# Patient Record
Sex: Male | Born: 1945 | Race: Black or African American | Hispanic: No | Marital: Married | Smoking: Former smoker
Health system: Southern US, Community
[De-identification: ages and names within clinical notes are randomized; demographics above are authoritative.]

## PROBLEM LIST (undated history)

## (undated) DIAGNOSIS — E7800 Pure hypercholesterolemia, unspecified: Secondary | ICD-10-CM

## (undated) DIAGNOSIS — D469 Myelodysplastic syndrome, unspecified: Secondary | ICD-10-CM

## (undated) DIAGNOSIS — I10 Essential (primary) hypertension: Secondary | ICD-10-CM

## (undated) DIAGNOSIS — D649 Anemia, unspecified: Secondary | ICD-10-CM

## (undated) DIAGNOSIS — E119 Type 2 diabetes mellitus without complications: Secondary | ICD-10-CM

## (undated) DIAGNOSIS — N186 End stage renal disease: Secondary | ICD-10-CM

## (undated) DIAGNOSIS — N400 Enlarged prostate without lower urinary tract symptoms: Secondary | ICD-10-CM

## (undated) DIAGNOSIS — N289 Disorder of kidney and ureter, unspecified: Secondary | ICD-10-CM

## (undated) DIAGNOSIS — M1990 Unspecified osteoarthritis, unspecified site: Secondary | ICD-10-CM

## (undated) DIAGNOSIS — Z992 Dependence on renal dialysis: Secondary | ICD-10-CM

## (undated) HISTORY — PX: COLONOSCOPY: SHX174

## (undated) HISTORY — DX: Pure hypercholesterolemia, unspecified: E78.00

## (undated) HISTORY — DX: End stage renal disease: N18.6

## (undated) HISTORY — DX: Type 2 diabetes mellitus without complications: E11.9

## (undated) HISTORY — DX: Benign prostatic hyperplasia without lower urinary tract symptoms: N40.0

## (undated) HISTORY — DX: Essential (primary) hypertension: I10

---
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
## 1988-10-17 HISTORY — PX: NECK SURGERY: SHX720

## 1998-09-01 ENCOUNTER — Ambulatory Visit (HOSPITAL_COMMUNITY): Admission: RE | Admit: 1998-09-01 | Discharge: 1998-09-01 | Payer: Self-pay | Admitting: Internal Medicine

## 1998-12-23 ENCOUNTER — Ambulatory Visit (HOSPITAL_COMMUNITY): Admission: RE | Admit: 1998-12-23 | Discharge: 1998-12-23 | Payer: Self-pay | Admitting: Internal Medicine

## 1999-03-18 HISTORY — PX: CERVICAL DISCECTOMY: SHX98

## 1999-05-05 ENCOUNTER — Ambulatory Visit (HOSPITAL_COMMUNITY): Admission: RE | Admit: 1999-05-05 | Discharge: 1999-05-05 | Payer: Self-pay | Admitting: Internal Medicine

## 1999-05-05 ENCOUNTER — Encounter: Payer: Self-pay | Admitting: Internal Medicine

## 1999-05-08 ENCOUNTER — Ambulatory Visit (HOSPITAL_COMMUNITY): Admission: RE | Admit: 1999-05-08 | Discharge: 1999-05-08 | Payer: Self-pay | Admitting: Internal Medicine

## 1999-05-08 ENCOUNTER — Encounter: Payer: Self-pay | Admitting: Internal Medicine

## 2000-01-24 ENCOUNTER — Encounter: Payer: Self-pay | Admitting: Emergency Medicine

## 2000-01-24 ENCOUNTER — Emergency Department (HOSPITAL_COMMUNITY): Admission: EM | Admit: 2000-01-24 | Discharge: 2000-01-24 | Payer: Self-pay | Admitting: Emergency Medicine

## 2000-03-04 ENCOUNTER — Encounter: Payer: Self-pay | Admitting: Neurological Surgery

## 2000-03-04 ENCOUNTER — Ambulatory Visit (HOSPITAL_COMMUNITY): Admission: RE | Admit: 2000-03-04 | Discharge: 2000-03-04 | Payer: Self-pay | Admitting: Neurological Surgery

## 2000-04-25 ENCOUNTER — Encounter: Payer: Self-pay | Admitting: Neurological Surgery

## 2000-04-27 ENCOUNTER — Inpatient Hospital Stay (HOSPITAL_COMMUNITY): Admission: RE | Admit: 2000-04-27 | Discharge: 2000-04-29 | Payer: Self-pay | Admitting: Neurological Surgery

## 2000-04-27 ENCOUNTER — Encounter: Payer: Self-pay | Admitting: Neurological Surgery

## 2000-06-14 ENCOUNTER — Encounter: Payer: Self-pay | Admitting: Neurological Surgery

## 2000-06-14 ENCOUNTER — Encounter: Admission: RE | Admit: 2000-06-14 | Discharge: 2000-06-14 | Payer: Self-pay | Admitting: Neurological Surgery

## 2004-06-01 ENCOUNTER — Encounter: Admission: RE | Admit: 2004-06-01 | Discharge: 2004-06-01 | Payer: Self-pay | Admitting: Internal Medicine

## 2005-11-17 ENCOUNTER — Encounter: Admission: RE | Admit: 2005-11-17 | Discharge: 2005-11-17 | Payer: Self-pay | Admitting: Internal Medicine

## 2007-05-24 ENCOUNTER — Encounter: Admission: RE | Admit: 2007-05-24 | Discharge: 2007-05-24 | Payer: Self-pay | Admitting: Internal Medicine

## 2008-09-29 ENCOUNTER — Encounter: Admission: RE | Admit: 2008-09-29 | Discharge: 2008-09-29 | Payer: Self-pay | Admitting: Internal Medicine

## 2008-10-06 ENCOUNTER — Encounter: Admission: RE | Admit: 2008-10-06 | Discharge: 2008-10-06 | Payer: Self-pay | Admitting: Nephrology

## 2008-10-17 HISTORY — PX: DIALYSIS FISTULA CREATION: SHX611

## 2008-11-24 ENCOUNTER — Ambulatory Visit: Payer: Self-pay | Admitting: Surgery

## 2008-11-25 ENCOUNTER — Ambulatory Visit (HOSPITAL_COMMUNITY): Admission: RE | Admit: 2008-11-25 | Discharge: 2008-11-25 | Payer: Self-pay | Admitting: Surgery

## 2009-02-12 ENCOUNTER — Encounter (HOSPITAL_COMMUNITY): Admission: RE | Admit: 2009-02-12 | Discharge: 2009-05-13 | Payer: Self-pay | Admitting: Nephrology

## 2009-05-20 ENCOUNTER — Encounter (HOSPITAL_COMMUNITY): Admission: RE | Admit: 2009-05-20 | Discharge: 2009-07-10 | Payer: Self-pay | Admitting: Nephrology

## 2009-05-29 ENCOUNTER — Ambulatory Visit: Payer: Self-pay | Admitting: Vascular Surgery

## 2009-05-29 ENCOUNTER — Ambulatory Visit (HOSPITAL_COMMUNITY): Admission: RE | Admit: 2009-05-29 | Discharge: 2009-05-29 | Payer: Self-pay | Admitting: Surgery

## 2009-07-07 ENCOUNTER — Ambulatory Visit: Payer: Self-pay | Admitting: Vascular Surgery

## 2009-08-06 ENCOUNTER — Ambulatory Visit: Payer: Self-pay | Admitting: Surgery

## 2009-08-06 ENCOUNTER — Encounter (HOSPITAL_COMMUNITY): Admission: RE | Admit: 2009-08-06 | Discharge: 2009-11-04 | Payer: Self-pay | Admitting: Vascular Surgery

## 2009-10-14 ENCOUNTER — Ambulatory Visit: Payer: Self-pay | Admitting: Vascular Surgery

## 2009-11-05 ENCOUNTER — Ambulatory Visit: Payer: Self-pay | Admitting: Vascular Surgery

## 2009-11-12 ENCOUNTER — Ambulatory Visit: Payer: Self-pay | Admitting: Vascular Surgery

## 2010-01-13 ENCOUNTER — Ambulatory Visit: Payer: Self-pay | Admitting: Vascular Surgery

## 2010-04-27 ENCOUNTER — Ambulatory Visit: Payer: Self-pay | Admitting: Vascular Surgery

## 2010-04-29 HISTORY — PX: CARDIAC CATHETERIZATION: SHX172

## 2010-06-22 ENCOUNTER — Ambulatory Visit: Payer: Self-pay | Admitting: Vascular Surgery

## 2010-10-17 HISTORY — PX: EYE SURGERY: SHX253

## 2010-10-28 ENCOUNTER — Encounter
Admission: RE | Admit: 2010-10-28 | Discharge: 2010-10-28 | Payer: Self-pay | Source: Home / Self Care | Attending: Gastroenterology | Admitting: Gastroenterology

## 2010-11-04 ENCOUNTER — Encounter
Admission: RE | Admit: 2010-11-04 | Discharge: 2010-11-04 | Payer: Self-pay | Source: Home / Self Care | Attending: Gastroenterology | Admitting: Gastroenterology

## 2010-11-07 ENCOUNTER — Encounter: Payer: Self-pay | Admitting: Nephrology

## 2011-01-22 LAB — POCT I-STAT 4, (NA,K, GLUC, HGB,HCT)
Glucose, Bld: 175 mg/dL — ABNORMAL HIGH (ref 70–99)
HCT: 40 % (ref 39.0–52.0)

## 2011-01-22 LAB — GLUCOSE, CAPILLARY: Glucose-Capillary: 144 mg/dL — ABNORMAL HIGH (ref 70–99)

## 2011-01-22 LAB — POCT HEMOGLOBIN-HEMACUE: Hemoglobin: 11.4 g/dL — ABNORMAL LOW (ref 13.0–17.0)

## 2011-01-23 LAB — RENAL FUNCTION PANEL
BUN: 94 mg/dL — ABNORMAL HIGH (ref 6–23)
CO2: 21 mEq/L (ref 19–32)
Chloride: 109 mEq/L (ref 96–112)
Creatinine, Ser: 13.72 mg/dL — ABNORMAL HIGH (ref 0.4–1.5)
Glucose, Bld: 103 mg/dL — ABNORMAL HIGH (ref 70–99)
Potassium: 3.9 mEq/L (ref 3.5–5.1)

## 2011-01-23 LAB — IRON AND TIBC
Iron: 45 ug/dL (ref 42–135)
TIBC: 256 ug/dL (ref 215–435)

## 2011-01-23 LAB — POCT HEMOGLOBIN-HEMACUE: Hemoglobin: 10.7 g/dL — ABNORMAL LOW (ref 13.0–17.0)

## 2011-01-24 LAB — IRON AND TIBC
Iron: 32 ug/dL — ABNORMAL LOW (ref 42–135)
Saturation Ratios: 13 % — ABNORMAL LOW (ref 20–55)
TIBC: 250 ug/dL (ref 215–435)
UIBC: 218 ug/dL

## 2011-01-24 LAB — FERRITIN: Ferritin: 282 ng/mL (ref 22–322)

## 2011-01-25 LAB — IRON AND TIBC
Iron: 21 ug/dL — ABNORMAL LOW (ref 42–135)
UIBC: 256 ug/dL

## 2011-01-25 LAB — FERRITIN: Ferritin: 44 ng/mL (ref 22–322)

## 2011-01-25 LAB — POCT HEMOGLOBIN-HEMACUE: Hemoglobin: 8.9 g/dL — ABNORMAL LOW (ref 13.0–17.0)

## 2011-01-27 ENCOUNTER — Encounter: Payer: Self-pay | Admitting: Internal Medicine

## 2011-02-01 LAB — GLUCOSE, CAPILLARY
Glucose-Capillary: 111 mg/dL — ABNORMAL HIGH (ref 70–99)
Glucose-Capillary: 131 mg/dL — ABNORMAL HIGH (ref 70–99)

## 2011-02-01 LAB — POCT I-STAT 4, (NA,K, GLUC, HGB,HCT): Glucose, Bld: 136 mg/dL — ABNORMAL HIGH (ref 70–99)

## 2011-03-01 NOTE — Op Note (Signed)
NAME:  [name], [name]             ACCOUNT NO.:  [id number]   MEDICAL RECORD NO.:  [id number]          PATIENT TYPE:  AMB   LOCATION:  SDS                          FACILITY:  [facility]   PHYSICIAN:  Judeth Cornfield. Scot Dock, M.D.DATE OF BIRTH:  [birth year]   DATE OF PROCEDURE:  05/29/2009  DATE OF DISCHARGE:  05/29/2009                               OPERATIVE REPORT   PREOPERATIVE DIAGNOSIS:  Chronic kidney disease.   POSTOPERATIVE DIAGNOSIS:  Chronic kidney disease.   PROCEDURE:  Placement of a 19-cm Palindrome catheter under ultrasound  guidance to the right internal jugular.   SURGEON:  Judeth Cornfield. Scot Dock, MD   ANESTHESIA:  Local with sedation.   TECHNIQUE:  The patient was taken to the operating room and sedated by  Anesthesia.  The ultrasound scanner was used to scan both internal  jugular veins, both of which appeared to be patent.  The neck and upper  chest were then prepped and draped in the usual sterile fashion.  With  the patient in Trendelenburg and under ultrasound guidance, the right IJ  was cannulated, and a guidewire introduced into the superior vena cava  under fluoroscopic control.  The tract over the wire was dilated, and  then the dilator and peel-away sheath were advanced over the wire, and  the wire and dilator removed.  The catheter was passed through the peel-  away sheath and positioned in the right atrium.  The exit site for the  catheter was selected, and the skin anesthetized between the 2 areas.  The catheter was then brought through the tunnel, cut to the appropriate  length, and distal ports were attached.  Both ports withdrew easily.  We  then flushed with heparinized saline and filled with concentrated  heparin.  The catheter was secured at its exit site with a 3-0 nylon  suture.  The IJ cannulation site was closed with 4-0 subcuticular  stitch.  Sterile dressing was applied.  The patient tolerated the  procedure well, and was transferred to the  recovery room in satisfactory  condition.  All needle and sponge counts were correct.      Judeth Cornfield. Scot Dock, M.D.  Electronically Signed     CSD/MEDQ  D:  05/29/2009  T:  05/30/2009  Job:  [id number]

## 2011-03-01 NOTE — Procedures (Signed)
CEPHALIC VEIN MAPPING   INDICATION:  Preop exam for AV fistula placement.   HISTORY:   EXAM:  The right cephalic vein is mostly compressible with mild chronic  thrombus noted at a valve leaflet of the proximal forearm level.   Diameter measurements range from 0.32 to 0.54 cm.   The left cephalic vein is compressible.   Diameter measurements range from 0.34 to 0.4 cm.   See attached worksheet for all measurements.   IMPRESSION:  Patent bilateral cephalic veins, as described above, with  diameter measurements noted above and on the attached worksheet.   ___________________________________________  V. Leia Alf, MD   CH/MEDQ  D:  11/24/2008  T:  11/24/2008  Job:  [id number]

## 2011-03-01 NOTE — Assessment & Plan Note (Signed)
OFFICE VISIT   [name], [name]  DOB:  1946-09-12                                       11/24/2008  [id number]   REASON FOR VISIT:  Evaluate for dialysis access.   HISTORY:  This is a 65 year old left-handed gentleman not yet on  dialysis who I am seeing at the request of Dr. Florene Glen for evaluation of  permanent access.  The patient has end-stage renal disease secondary to  hypertension and diabetes.  Again, he is not yet on dialysis.   PAST MEDICAL HISTORY:  Diabetes, hypertension.  He has been using  insulin.  He is on insulin and his age of onset of his diabetes was 86.   FAMILY HISTORY:  Positive for cardiovascular disease in his brother, his  father and his sister.   SOCIAL HISTORY:  He is married, he is retired.  Does not smoke.  He has  a history smoking but quit 20 years ago.  He does not drink alcohol.   REVIEW OF SYSTEMS:  GENERAL:  Negative for fever, chills, weight gain,  weight loss.  CARDIAC:  Negative for chest pain.  PULMONARY:  Negative for shortness of breath.  GI:  Positive for constipation.  GU:  Positive for frequent urination.  VASCULAR:  Negative.  NEURO:  Negative.  ORTHO:  Positive for arthritis and a rash.  PSYCH:  Negative.  ENT:  Negative.  HEME:  Negative.   MEDICATIONS:  Metoprolol, __________, glimepiride, diltiazem, Klor-Con,  Lasix, Diovan, Rena-Vite, doxazosin, allopurinol, Caltrate, Lantus and  PhosLo.   ALLERGIES:  Codeine.   PHYSICAL EXAMINATION:  His blood pressure is 168/87, pulse is 69,  temperature is 97.9.  General:  He is well-appearing, no distress.  Cardiovascular:  Regular rate and rhythm.  Pulmonary:  Lungs are clear  bilaterally.  Extremities:  Warm and well-perfused.  He has a right  brachial pulse and a right radial pulse.   DIAGNOSTIC STUDIES:  Vein mapping was performed today.  It shows  adequate right cephalic vein.  Just below the antecubital crease, there  is a partial occlusive  chronic thrombus on a valve leaflet.   ASSESSMENT/PLAN:  Chronic kidney disease needing dialysis.   PLAN:  The patient will be scheduled for a right upper arm fistula.  I  am going to try to do this tomorrow.  I do not feel doing a  radiocephalic fistula would be a good idea given this partially  occlusive chronic thrombus on the valve.  I discussed the risks and  benefits of the procedure, also the risk of steal and the risk of non-  maturity.  The patient understands these risks and is willing to  proceed.   Eldridge Abrahams, MD  Electronically Signed   VWB/MEDQ  D:  11/24/2008  T:  11/25/2008  Job:  [id number]   cc:   Darrold Span. Florene Glen, M.D.  Eric L. Marlou Sa, M.D.

## 2011-03-01 NOTE — Assessment & Plan Note (Signed)
OFFICE VISIT   [name], [name]  DOB:  [birth year]                                       07/07/2009  [id number]   The patient is a 65 year old male patient with end-stage renal disease  being dialyzed through a Diatek catheter in the right internal jugular  vein at the present time.  He had a right upper arm fistula created by  Dr. Oneida Alar in February of this year.  The fistula is patent with an  excellent pulse and palpable thrill but very tortuous.  One attempt at  using this for hemodialysis resulted in the needle puncturing the side  wall.  The patient has discussed this with Dr. Florene Glen regarding  utilizing different types of needles.  The patient is left-handed.   PHYSICAL EXAM:  Blood pressure 136/78, heart rate 70, respirations 14.  He has what appears to be a fairly good cephalic vein in his left  forearm except adjacent to the wrist which communicates with the upper  arm cephalic vein on the left.  The right upper arm fistula is widely  patent but obviously the vein is quite torturous although there are a  few short straight segments.   I hate to sacrifice the right upper arm fistula.  The only option we  would have surgically would be to replace this with an upper arm graft.  We can create a fistula in the left forearm if the renal doctors would  prefer for [place] to do this but I would think possibly they could use  shorter needles and come up with another plan to utilize the right upper  arm fistula since it is functioning so well.  I will await for input  from Dr. Florene Glen after he further discuss this with the patient regarding  the options.   Nelda Severe Kellie Simmering, M.D.  Electronically Signed   JDL/MEDQ  D:  07/07/2009  T:  07/08/2009  Job:  [id number]

## 2011-03-01 NOTE — Op Note (Signed)
NAME:  [name], [name]             ACCOUNT NO.:  [id number]   MEDICAL RECORD NO.:  [id number]          PATIENT TYPE:  AMB   LOCATION:  SDS                          FACILITY:  [facility]   PHYSICIAN:  Jessy Oto. Fields, MD  DATE OF BIRTH:  05/10/1946   DATE OF PROCEDURE:  11/25/2008  DATE OF DISCHARGE:  11/25/2008                               OPERATIVE REPORT   SURGEON:  Jessy Oto. Fields, MD   PROCEDURE:  Right brachiocephalic arteriovenous fistula.   PREOPERATIVE DIAGNOSIS:  End-stage renal disease.   POSTOPERATIVE DIAGNOSIS:  End-stage renal disease.   ANESTHESIA:  Local with IV sedation.   ASSISTANT:  Nurse.   OPERATIVE FINDINGS:  A 3.5- to 4-mm cephalic vein right arm.   OPERATIVE DETAILS:  After obtaining informed consent, the patient was  taken to the operating room.  The patient was placed in supine position  on the operating room table.  After adequate sedation, the patient's  entire right upper extremity was prepped and draped in the usual sterile  fashion.  Local anesthesia was infiltrated near the antecubital crease.  Transverse incision was made in this location, carried down through the  subcutaneous tissues down to the level of the cephalic vein.  Cephalic  vein was of good quality approximately 3.5-4 mm in diameter.  This was  dissected free circumferentially and small side branches ligated and  divided between silk ties.  Next, the brachial artery was dissected free  in the medial portion of the incision.  This was dissected free  circumferentially.  Small side branches were ligated and divided between  silk ties.  The artery was approximately 4-4.5 mm in diameter.  This was  dissected free circumferentially and vessel loops were placed proximal  and distally at the planned site of arteriotomy.  The patient was then  given 5000 units of intravenous heparin.  Vessel loops were used to  control brachial artery proximal and distally.  The distal cephalic vein  was  ligated with 2-0 silk tie and transected.  The vein was then gently  distended and flushed with heparinized saline.  This was then marked for  orientation.  Longitudinal opening was made in the brachial artery, and  the vein was sewn end of vein to side of artery using a running 7-0  Prolene suture.  Just prior to completion of the anastomosis, this was  forward bled and backbled and thoroughly flushed.  Anastomosis was  secured.  Vessel loops were released.  There was palpable thrill above  the fistula immediately.  Hemostasis was obtained.  Subcutaneous tissues  were reapproximated using interrupted 3-0  Vicryl sutures.  The skin was closed with 4-0 Vicryl subcuticular  stitch.  The patient had tolerated the procedure well, and there were no  complications.  Instrument, sponge, and needle counts were correct at  the end of the case.  The patient had palpable radial pulse at the end  of the case.      Jessy Oto. Fields, MD  Electronically Signed     CEF/MEDQ  D:  11/25/2008  T:  11/26/2008  Job:  [id number]

## 2011-03-04 NOTE — Op Note (Signed)
[place]. [facility]  Patient:    [name], [name]                    MRN: [id number] Proc. Date: 04/27/00 Adm. Date:  [id number] Disc. 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  PROCEDURE:  Anterior cervical diskectomy and arthrodesis of C6-7 and C7-T1, arthrodesis with structural allograft and Synthes fixation.  SURGEON:  Earleen Newport, M.D.  FIRST ASSISTANT:  Zigmund Daniel. Joya Salm, M.D.  ANESTHESIA:  General endotracheal.  INDICATIONS:  The patient is a 65 year old individual who has had significant left shoulder and right arm pain in the C8 distribution down to the right ulnar aspect of the hand.  The patient was found to have spondylitic disease throughout the cervical spine at the _________ level including a soft and hard disk at the C7-T1 level and spondylitic bridging and bilateral foraminal stenosis at C6-7.  The patient was advised regarding surgical decompression and stabilization of the C6-7 and C7-T1 levels.  DESCRIPTION OF PROCEDURE:  The patient was brought to the operating room and placed on the table in the supine position.  After smooth induction of general endotracheal anesthesia, he was placed in 5 pounds of halter traction.  The neck was shaved and prepped with DuraPrep and draped in a sterile fashion.  A transverse incision was made in the base of the neck and carried down through the platysma.  The plane between the sternocleidomastoid and the strap muscles were dissected bluntly until the prevertebral space was reached.  The first identifiable disk space most superiorly was noted to be C5-6.  Dissection was then carried inferiorly to expose the C6-7 and C7-T1.  The longus colli muscle was  stripped and a Caspar retractor was placed.  C6-7 was then opened first and the disk was noted to be markedly degenerated.  This space was evacuated of significant contents of degenerated disk material.  As the posterior longitudinal ligament was opened, there was noted to be a significant uncinate spur and spur from the inferior margin of the body of C7.  This was taken down with the Midas Rex and A2 bur.  Dissection was then carried out laterally to remove large uncinate spurs on both sides.  This allowed opening of the foramen of the C7 nerve root.  Once this area was well-decompressed, a 7 mm round fibular graft was placed into the interspace after being packed with some of the bone from the uncinate spur material.  Attention was then turned to C7-T1.  Here, a large right-sided uncinate spur with some soft material was encountered.  This was taken down and decompressed to alleviate the foramen of the C8 nerve root bilaterally.  Hemostasis from the soft tissues and epidural veins was obtained with a bipolar cautery and small pledgets of Gelfoam soaked in thrombin which were later removed.  Again, a 7 mm round fibular graft was packed with bone from the uncinate spur and placed into the interspace.  Traction was removed and the neck was placed into slight flexion.  A standard Synthes anterior cervical plate was then placed over this area after being contoured appropriately and placed into position with six  locking 4 x 14 mm screws.  Radiographic appearance of the construct was checked and found to be appropriate.  The area was copiously irrigated with antibiotic irrigating solution, and then the platysma was closed with 3-0 Vicryl in an interrupted fashion, and 3-0 Vicryl was used subcuticular. Tegaderm dressing was placed on the skin.  The patient tolerated the procedure well and was returned to the recovery room in stable condition. DD:  04/27/00 TD:  04/27/00 Job:  [id number]

## 2011-03-04 NOTE — H&P (Signed)
[place]. [facility]  Patient:    [name]                    MRN: [id number] Adm. Date:  [id number] Disc. Date: [id number] Attending:  Prentiss Bells                         History and Physical  ADMITTING DIAGNOSIS: Cervical spondylosis, C6-7 and C7-T1, with right cervical radiculopathy.  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  Last July he had a work-up including an MRI of the cervical spine performed on May 05, 1999 and that study demonstrated spondylitic disease, worse at the C6-7 level, with bilateral foraminal stenosis.  There was a question of some intrinsic signal change that was attributed to spinal fluid dynamics.  The patient has had persistent symptoms and gradually they have worsened such that he has noticed that his right hand and arm will wake up because of pain that feels like a toothache in the right arm.  He notes that trials with Vioxx initially seemed to help.  He was recently switched to Celebrex and this seemed to give him some relief, at least temporarily.  He will wake up in the middle of the night with significant bouts of pain.  Physical therapy helped minimally.  The lower extremities have been functioning well.  He reports no symptoms of numbness or pain in the lower extremities.  Bowel and bladder control have not been effected.  Coughing and sneezing do not seem to aggravate his pain.  PAST MEDICAL HISTORY: His general health has been fair.  1. Hypertension, controlled medically.  2. Diabetes, controlled medically.  MEDICATIONS:  1. Celebrex 200 mg q.d.  2. Prinivil 20 mg q.d. for blood pressure.  3. Allegra 60 mg t.i.d. for allergies.  4. Triamterene 37.5/25 mg for blood  pressure.  5. Glucophage 1000 mg b.i.d. for diabetes.  6. Tiazac 420 mg q.d. for blood pressure.  7. Cardura 4 mg b.i.d. for blood pressure.  ALLERGIES: He notes that CODEINE tends to make him vomit.  PAST SURGICAL HISTORY: The patient notes that he has had no surgical intervention in the past.  SOCIAL HISTORY: He does not smoke.  He drinks alcohol socially.  His weight has been stable.  FAMILY HISTORY: His mother died at age 88 of breast cancer.  His father died at age 49 of high blood pressure.  REVIEW OF SYSTEMS: Notable for night sweats, wearing of glasses, high blood pressure, kidney stones, back pain, pain in the neck and arm.  A Review Of Systems sheet was reviewed with the patient present.  PHYSICAL EXAMINATION:  GENERAL: He is an alert, oriented, and cooperative individual in no overt distress.  VITAL SIGNS: Weight 210 pounds.  Height 6 feet.  Blood pressure 160/94, heart rate 66 and regular, respirations 14.  NEUROLOGIC: Range of motion of his neck reveals that he turns only 45 degrees to the right and 60 degrees to the left.  He extends and flexes fully.  Axial compression does not reproduce pain.  Motor strength in the upper extremities reveals the deltoid and biceps have 5/5 strength, triceps strength is 5/5, intrinsic strength reveals some weakness in the extensor  digiti minimi on the right side only.  There is a slight amount of atrophy in the hypothenar eminence on the right.  Sensory is intact to pinprick in the C5, C6, and C7 distributions, decreased clearly in the C8 distribution - particularly on the right side.  Cranial nerve examination reveals the pupils are 4 mm and briskly reactive to light and accommodation.  Extraocular movements were full.  Face is symmetric to grimace.  Tongues and uvula are midline.  Sclerae and conjunctivae are clear.  NECK: No bruits.  No masses palpable, though there is tenderness to palpation in the right supraclavicular  fossa.  No bruits heard.  LUNGS: Clear to auscultation.  HEART: Regular rate and rhythm.  ABDOMEN: Soft, bowel sounds positive.  No masses are palpable.  EXTREMITIES: No clubbing, cyanosis, or edema.  IMPRESSION: The patient has evidence of a right-sided cervical spondylitic disease with significant foraminal stenosis at C6-7 bilaterally and C7-T1 particularly on the right.  PLAN: He is now being admitted to undergo surgical decompression and stabilization at the lower two levels. DD:  04/27/00 TD:  04/27/00 Job: [id number]

## 2011-09-29 LAB — HEMOGLOBIN A1C: Hgb A1c MFr Bld: 6.9 % — AB (ref 4.0–6.0)

## 2011-09-29 LAB — BASIC METABOLIC PANEL WITH GFR
BUN: 33 mg/dL — AB (ref 4–21)
Creatinine: 10 mg/dL — AB (ref 0.6–1.3)
Glucose: 75 mg/dL
Potassium: 4.8 mmol/L (ref 3.4–5.3)
Sodium: 142 mmol/L (ref 137–147)

## 2011-09-29 LAB — HEPATIC FUNCTION PANEL
AST: 11 U/L — AB (ref 14–40)
Bilirubin, Total: 0.5 mg/dL

## 2011-10-27 ENCOUNTER — Ambulatory Visit: Payer: Self-pay | Admitting: Vascular Surgery

## 2011-10-27 LAB — POTASSIUM: Potassium: 4.2 mmol/L (ref 3.5–5.1)

## 2011-11-22 ENCOUNTER — Ambulatory Visit: Payer: Self-pay | Admitting: Vascular Surgery

## 2011-11-22 LAB — BASIC METABOLIC PANEL
Calcium, Total: 9.6 mg/dL (ref 8.5–10.1)
Chloride: 93 mmol/L — ABNORMAL LOW (ref 98–107)
Co2: 34 mmol/L — ABNORMAL HIGH (ref 21–32)
Glucose: 165 mg/dL — ABNORMAL HIGH (ref 65–99)
Osmolality: 287 (ref 275–301)
Potassium: 4.3 mmol/L (ref 3.5–5.1)
Sodium: 139 mmol/L (ref 136–145)

## 2012-06-19 HISTORY — PX: TRANSTHORACIC ECHOCARDIOGRAM: SHX275

## 2012-07-10 ENCOUNTER — Other Ambulatory Visit: Payer: Self-pay | Admitting: *Deleted

## 2012-07-13 ENCOUNTER — Encounter (HOSPITAL_COMMUNITY): Payer: Self-pay

## 2012-07-18 MED ORDER — SODIUM CHLORIDE 0.9 % IJ SOLN: 3.0000 mL | INTRAMUSCULAR | Status: DC | PRN

## 2012-07-19 ENCOUNTER — Ambulatory Visit (HOSPITAL_COMMUNITY): Admission: RE | Admit: 2012-07-19 | Payer: Medicare Other | Source: Ambulatory Visit | Admitting: Vascular Surgery

## 2012-07-19 ENCOUNTER — Encounter (HOSPITAL_COMMUNITY): Admission: RE | Payer: Self-pay | Source: Ambulatory Visit

## 2012-07-19 SURGERY — SHUNTOGRAM
Anesthesia: LOCAL

## 2012-08-17 ENCOUNTER — Emergency Department (HOSPITAL_COMMUNITY)
Admission: EM | Admit: 2012-08-17 | Discharge: 2012-08-17 | Disposition: A | Discharge status: Home / Self Care | Payer: Medicare Other | Attending: Emergency Medicine | Admitting: Emergency Medicine

## 2012-08-17 ENCOUNTER — Encounter (HOSPITAL_COMMUNITY): Payer: Self-pay | Admitting: Family Medicine

## 2012-08-17 DIAGNOSIS — R350 Frequency of micturition: Secondary | ICD-10-CM | POA: Insufficient documentation

## 2012-08-17 DIAGNOSIS — Z79899 Other long term (current) drug therapy: Secondary | ICD-10-CM | POA: Insufficient documentation

## 2012-08-17 DIAGNOSIS — N400 Enlarged prostate without lower urinary tract symptoms: Secondary | ICD-10-CM | POA: Insufficient documentation

## 2012-08-17 DIAGNOSIS — N186 End stage renal disease: Secondary | ICD-10-CM | POA: Insufficient documentation

## 2012-08-17 DIAGNOSIS — E119 Type 2 diabetes mellitus without complications: Secondary | ICD-10-CM | POA: Insufficient documentation

## 2012-08-17 DIAGNOSIS — E7800 Pure hypercholesterolemia, unspecified: Secondary | ICD-10-CM | POA: Insufficient documentation

## 2012-08-17 DIAGNOSIS — N390 Urinary tract infection, site not specified: Secondary | ICD-10-CM | POA: Insufficient documentation

## 2012-08-17 DIAGNOSIS — I959 Hypotension, unspecified: Secondary | ICD-10-CM | POA: Insufficient documentation

## 2012-08-17 DIAGNOSIS — I10 Essential (primary) hypertension: Secondary | ICD-10-CM | POA: Insufficient documentation

## 2012-08-17 LAB — URINALYSIS, ROUTINE W REFLEX MICROSCOPIC
Ketones, ur: 15 mg/dL — AB
Protein, ur: >300 mg/dL — AB
Urobilinogen, UA: 0.2 mg/dL (ref 0.0–1.0)

## 2012-08-17 LAB — URINE MICROSCOPIC-ADD ON

## 2012-08-17 MED ORDER — TRAMADOL HCL 50 MG PO TABS: 50.0000 mg | ORAL_TABLET | Freq: Two times a day (BID) | ORAL | Status: DC

## 2012-08-17 MED ORDER — CEPHALEXIN 500 MG PO CAPS: 500.0000 mg | ORAL_CAPSULE | Freq: Two times a day (BID) | ORAL | Status: DC

## 2012-08-17 MED ORDER — CEPHALEXIN 500 MG PO CAPS: 1000.0000 mg | ORAL_CAPSULE | Freq: Two times a day (BID) | ORAL | Status: DC

## 2012-08-17 NOTE — ED Notes (Signed)
Ordered diabetic food tray.

## 2012-08-17 NOTE — ED Provider Notes (Signed)
Medical screening examination/treatment/procedure(s) were conducted as a shared visit with non-physician practitioner(s) and myself.  I personally evaluated the patient during the encounter  Orlie Dakin, MD 08/17/12 1641

## 2012-08-17 NOTE — ED Notes (Signed)
Per pt sts was at dialysis center this am and BP was too low. sts hematuria since last night. sts also lower abdominal pain

## 2012-08-17 NOTE — ED Notes (Signed)
Pt sts he can void yet most productive in AM at wake up.  Pt sts remainder of day he only voids in "drops".

## 2012-08-17 NOTE — ED Provider Notes (Signed)
History     CSN: [id number]  Arrival date & time 08/17/12  [id number]   First MD Initiated Contact with Patient 08/17/12 0757      Chief Complaint  Patient presents with  . Hypotension  . Urinary Tract Infection    (Consider location/radiation/quality/duration/timing/severity/associated sxs/prior treatment) HPI Patient reports pain in his penis and urinary frequency onset yesterday. Denies abdominal pain denies vomiting or nausea denies other complaint denies discharge from penis Seen at dialysis center this morning. States his blood pressure was 123456 systolic. He was told for dialysis has been arranged for him for tomorrow sent here for further evaluation. Denies abdominal pain pain is worse with urination he is presently asymptomatic Past Medical History  Diagnosis Date  . Type II or unspecified type diabetes mellitus without mention of complication, not stated as uncontrolled   . End stage renal disease   . Essential hypertension, malignant   . Hypertrophy of prostate without urinary obstruction and other lower urinary tract symptoms (LUTS)   . Pure hypercholesterolemia     Past Surgical History  Procedure Date  . Back surgery     History reviewed. No pertinent family history.  History  Substance Use Topics  . Smoking status: Never Smoker   . Smokeless tobacco: Not on file  . Alcohol Use: No      Review of Systems  Genitourinary: Positive for dysuria.  All other systems reviewed and are negative.    Allergies  Codeine and Doxycycline  Home Medications   Current Outpatient Rx  Name Route Sig Dispense Refill  . ALLOPURINOL 300 MG PO TABS Oral Take 300 mg by mouth daily.      [name] AMLODIPINE BESYLATE 5 MG PO TABS Oral Take 5 mg by mouth daily.    [name] CINACALCET HCL 90 MG PO TABS Oral Take 90 mg by mouth daily.    [name] DOXAZOSIN MESYLATE 8 MG PO TABS Oral Take 8 mg by mouth daily.      [name] GLIMEPIRIDE 2 MG PO TABS Oral Take 2 mg by mouth daily before breakfast.    .  LANTHANUM CARBONATE 1000 MG PO CHEW Oral Chew 1,000 mg by mouth 4 (four) times daily. With meals and snacks    . LATANOPROST 0.005 % OP SOLN Both Eyes Place 1 drop into both eyes at bedtime.      [name] LINAGLIPTIN 5 MG PO TABS Oral Take 5 mg by mouth daily.    [name] METOPROLOL TARTRATE 50 MG PO TABS Oral Take 0.5 mg by mouth 2 (two) times daily.    [name] SEVELAMER CARBONATE 800 MG PO TABS Oral Take 1,600 mg by mouth 3 (three) times daily before meals. 3 tabs-3 times daily      BP 124/58  Pulse 100  Temp 99.3 F (37.4 C)  Resp 18  SpO2 98%  Physical Exam  Nursing note and vitals reviewed. Constitutional: He appears well-developed and well-nourished.  HENT:  Head: Normocephalic and atraumatic.  Eyes: Conjunctivae normal are normal. Pupils are equal, round, and reactive to light.  Neck: Neck supple. No tracheal deviation present. No thyromegaly present.  Cardiovascular: Normal rate and regular rhythm.   No murmur heard. Pulmonary/Chest: Effort normal and breath sounds normal.  Abdominal: Soft. Bowel sounds are normal. He exhibits no distension. There is no tenderness.  Genitourinary:       Normal circumcised male no lesion no discharge  Musculoskeletal: Normal range of motion. He exhibits no edema and no tenderness.  Right upper extremity with dialysis graft with good thrill  Neurological: He is alert. Coordination normal.  Skin: Skin is warm and dry. No rash noted.  Psychiatric: He has a normal mood and affect.    ED Course  Procedures (including critical care time)  Labs Reviewed - No data to display No results found.   No diagnosis found.    MDM  Patient reports he is call dialysis center and dialysis is arranged for him tomorrow A Foley catheter was placed and the patient temporarily to get a sample as he produces very little urine Diagnosis dysuria        Orlie Dakin, MD 08/17/12 1121

## 2012-08-17 NOTE — ED Provider Notes (Signed)
[name] is a 66 y.o. male in CDU pending urinalysis sign out from Dr. Winfred Leeds as follows: Plan is to consult urology for close followup to evaluate pain 2 penile meatus and dysuria. Patient has past medical history significant for diabetes, ESRD on dialysis (makes scant amount of urine), high cholesterol and BPH complaining of urinary frequency and dysuria for the last 24 hours. Patient has missed dialysis this morning. He has arranged for dialysis tomorrow.  Results for orders placed during the hospital encounter of 08/17/12  URINALYSIS, ROUTINE W REFLEX MICROSCOPIC      Component Value Range   Color, Urine AMBER (*) YELLOW   APPearance CLOUDY (*) CLEAR   Specific Gravity, Urine 1.020  1.005 - 1.030   pH 7.0  5.0 - 8.0   Glucose, UA NEGATIVE  NEGATIVE mg/dL   Hgb urine dipstick LARGE (*) NEGATIVE   Bilirubin Urine SMALL (*) NEGATIVE   Ketones, ur 15 (*) NEGATIVE mg/dL   Protein, ur >300 (*) NEGATIVE mg/dL   Urobilinogen, UA 0.2  0.0 - 1.0 mg/dL   Nitrite POSITIVE (*) NEGATIVE   Leukocytes, UA MODERATE (*) NEGATIVE  URINE MICROSCOPIC-ADD ON      Component Value Range   WBC, UA TOO NUMEROUS TO COUNT  <3 WBC/hpf   RBC / HPF TOO NUMEROUS TO COUNT  <3 RBC/hpf   Bacteria, UA MANY (*) RARE   Urine-Other LESS THAN 10 mL OF URINE SUBMITTED     Urine culture is pending.   Consult from urologist Dr. Jasmine December appreciated: He will see the patient in his office on November 11 at 945. This is the earliest available appointment. I have encouraged the patient to have a very low threshold to return to the emergency room for any worsening of symptoms before that time.   Pt verbalized understanding and agrees with care plan. Outpatient follow-up and return precautions given.    New Prescriptions   CEPHALEXIN (KEFLEX) 500 MG CAPSULE    Take 1 capsule (500 mg total) by mouth 2 (two) times daily. Take after dialysis   TRAMADOL (ULTRAM) 50 MG TABLET    Take 1 tablet (50 mg total) by mouth 2  (two) times daily.     Monico Blitz, PA-C 08/17/12 1333  [place], PA-C 08/17/12 1349

## 2012-08-17 NOTE — ED Notes (Signed)
Patient has a fistula in his right upper arm, +bruit/thrill.

## 2012-08-17 NOTE — ED Notes (Signed)
Patient has been having lower back pain yesterday that has travelled to the front abdomen. He is having 8/10 pain at this time. He is a renal dialysis patient, M,W,F. He was at the dialysis center when his BP was low and refused to have dialysis.

## 2012-08-20 LAB — URINE CULTURE: Colony Count: >=100000

## 2012-08-21 NOTE — ED Notes (Signed)
+   urine Patient treated with keflex-sensitive to same-chart appended per protocol MD.

## 2012-10-01 ENCOUNTER — Inpatient Hospital Stay (HOSPITAL_COMMUNITY)
Admission: EM | Admit: 2012-10-01 | Discharge: 2012-10-04 | DRG: 689 | Disposition: A | Discharge status: Home / Self Care | Payer: Medicare Other | Attending: Internal Medicine | Admitting: Internal Medicine

## 2012-10-01 ENCOUNTER — Emergency Department (HOSPITAL_COMMUNITY): Payer: Medicare Other

## 2012-10-01 ENCOUNTER — Encounter (HOSPITAL_COMMUNITY): Payer: Self-pay | Admitting: Emergency Medicine

## 2012-10-01 DIAGNOSIS — E113513 Type 2 diabetes mellitus with proliferative diabetic retinopathy with macular edema, bilateral: Secondary | ICD-10-CM | POA: Diagnosis present

## 2012-10-01 DIAGNOSIS — N390 Urinary tract infection, site not specified: Principal | ICD-10-CM

## 2012-10-01 DIAGNOSIS — N200 Calculus of kidney: Secondary | ICD-10-CM

## 2012-10-01 DIAGNOSIS — N186 End stage renal disease: Secondary | ICD-10-CM

## 2012-10-01 DIAGNOSIS — E119 Type 2 diabetes mellitus without complications: Secondary | ICD-10-CM

## 2012-10-01 DIAGNOSIS — D631 Anemia in chronic kidney disease: Secondary | ICD-10-CM | POA: Diagnosis present

## 2012-10-01 DIAGNOSIS — D649 Anemia, unspecified: Secondary | ICD-10-CM

## 2012-10-01 DIAGNOSIS — Z992 Dependence on renal dialysis: Secondary | ICD-10-CM

## 2012-10-01 DIAGNOSIS — I10 Essential (primary) hypertension: Secondary | ICD-10-CM

## 2012-10-01 DIAGNOSIS — N138 Other obstructive and reflux uropathy: Secondary | ICD-10-CM | POA: Diagnosis present

## 2012-10-01 DIAGNOSIS — N1330 Unspecified hydronephrosis: Secondary | ICD-10-CM

## 2012-10-01 DIAGNOSIS — N139 Obstructive and reflux uropathy, unspecified: Secondary | ICD-10-CM | POA: Diagnosis present

## 2012-10-01 DIAGNOSIS — N039 Chronic nephritic syndrome with unspecified morphologic changes: Secondary | ICD-10-CM | POA: Diagnosis present

## 2012-10-01 DIAGNOSIS — N401 Enlarged prostate with lower urinary tract symptoms: Secondary | ICD-10-CM | POA: Diagnosis present

## 2012-10-01 DIAGNOSIS — I120 Hypertensive chronic kidney disease with stage 5 chronic kidney disease or end stage renal disease: Secondary | ICD-10-CM | POA: Diagnosis present

## 2012-10-01 HISTORY — DX: Disorder of kidney and ureter, unspecified: N28.9

## 2012-10-01 HISTORY — DX: Unspecified osteoarthritis, unspecified site: M19.90

## 2012-10-01 LAB — COMPREHENSIVE METABOLIC PANEL
ALT: 8 U/L (ref 0–53)
AST: 14 U/L (ref 0–37)
Calcium: 9.4 mg/dL (ref 8.4–10.5)
Creatinine, Ser: 7 mg/dL — ABNORMAL HIGH (ref 0.50–1.35)
GFR calc Af Amer: 9 mL/min — ABNORMAL LOW (ref >90)
Glucose, Bld: 221 mg/dL — ABNORMAL HIGH (ref 70–99)
Sodium: 138 mEq/L (ref 135–145)
Total Protein: 8.2 g/dL (ref 6.0–8.3)

## 2012-10-01 LAB — CBC WITH DIFFERENTIAL/PLATELET
Basophils Absolute: 0 10*3/uL (ref 0.0–0.1)
Eosinophils Absolute: 0 10*3/uL (ref 0.0–0.7)
Eosinophils Relative: 0 % (ref 0–5)
MCH: 29.8 pg (ref 26.0–34.0)
MCV: 92.7 fL (ref 78.0–100.0)
Platelets: 213 10*3/uL (ref 150–400)
RDW: 15.1 % (ref 11.5–15.5)

## 2012-10-01 LAB — TROPONIN I: Troponin I: <0.3 ng/mL (ref <0.30)

## 2012-10-01 MED ORDER — MORPHINE SULFATE 4 MG/ML IJ SOLN
4.0000 mg | Freq: Once | INTRAMUSCULAR | Status: AC
  Administered 2012-10-01: 4 mg via INTRAVENOUS
  Filled 2012-10-01: qty 1

## 2012-10-01 MED ORDER — ONDANSETRON HCL 4 MG/2ML IJ SOLN
4.0000 mg | Freq: Once | INTRAMUSCULAR | Status: AC
  Administered 2012-10-01: 4 mg via INTRAVENOUS
  Filled 2012-10-01: qty 2

## 2012-10-01 MED ORDER — DEXTROSE 5 % IV SOLN
1.0000 g | Freq: Once | INTRAVENOUS | Status: AC
  Administered 2012-10-01: 1 g via INTRAVENOUS
  Filled 2012-10-01: qty 10

## 2012-10-01 MED ORDER — IOHEXOL 300 MG/ML  SOLN
100.0000 mL | Freq: Once | INTRAMUSCULAR | Status: AC | PRN
  Administered 2012-10-01: 100 mL via INTRAVENOUS

## 2012-10-01 MED ORDER — ACETAMINOPHEN 325 MG PO TABS
650.0000 mg | ORAL_TABLET | Freq: Once | ORAL | Status: AC
  Administered 2012-10-01: 650 mg via ORAL
  Filled 2012-10-01: qty 2

## 2012-10-01 MED ORDER — MORPHINE SULFATE 2 MG/ML IJ SOLN: 2.0000 mg | Freq: Once | INTRAMUSCULAR | Status: DC

## 2012-10-01 MED ORDER — IOHEXOL 300 MG/ML  SOLN
20.0000 mL | INTRAMUSCULAR | Status: AC
  Administered 2012-10-01 (×2): 20 mL via ORAL

## 2012-10-01 NOTE — ED Provider Notes (Signed)
66 y/o male moved to CDU to await CT scan which shows interval multiple tiny calculy in distal right ureter with interval mild right hydronephrosis and hydroureter. Pain at this time rated 7/10. He is still nauseated. Fever of 100.2. Tylenol, zofran and morphine given. On exam patient is laying in bed and appears slightly uncomfortable. HEENT negative. Heart RRR. Lungs CTAB. Abdomen soft with normal bowel sounds, TTP in right flank with guarding. No peritoneal signs. AAOx3. Giving IV rocephin since he is now febrile and cannot obtain u/a. 8:51 PM Dr. Christy Gentles consulted Dr. Roni Bread with urology who suggested patient be admitted. Dr. Jasmine December who is familiar with patient will see him in the morning. IV gentamycin has worked for his previous UTIs which can be done by the hospitalist. Family aware of plan. Admitted to [facility] team [address] [facility].  Illene Labrador, PA-C 10/01/12 2101

## 2012-10-01 NOTE — ED Notes (Signed)
Verbal report given to Renee,RN on 6700

## 2012-10-01 NOTE — ED Provider Notes (Signed)
History     CSN: [id number]  Arrival date & time 10/01/12  1242   First MD Initiated Contact with Patient 10/01/12 1502      Chief Complaint  Patient presents with  . Emesis     Patient is a 66 y.o. male presenting with vomiting. The history is provided by the patient and the spouse.  Emesis  This is a new problem. Episode onset: just prior to arrival. Episode frequency: several times. The problem has been gradually worsening. The emesis has an appearance of stomach contents. There has been no fever. Associated symptoms include abdominal pain. Pertinent negatives include no chills, no cough, no diarrhea, no fever and no headaches.  worsened by - nothing Improved by - nothing  Pt presents for vomiting and abdominal/back pain.  He reports he was at dialysis and started to have right sided back pain and vomiting.  He reports the pain is worsening.  No cp/sob.  No diarrhea.  No focal weakness.  No cough.  No recent falls.  He has been feeling well the past several days.  He does report recent treatment for uti but finished the course already.  He reported he had up to 3 hrs of dialysis He reports he only makes urine once/day. He relates this pain to similar to previous kidney stones He has been on dialysis for 3 yrs  Past Medical History  Diagnosis Date  . Type II or unspecified type diabetes mellitus without mention of complication, not stated as uncontrolled   . End stage renal disease   . Essential hypertension, malignant   . Hypertrophy of prostate without urinary obstruction and other lower urinary tract symptoms (LUTS)   . Pure hypercholesterolemia     Past Surgical History  Procedure Date  . Back surgery     History reviewed. No pertinent family history.  History  Substance Use Topics  . Smoking status: Never Smoker   . Smokeless tobacco: Not on file  . Alcohol Use: No      Review of Systems  Constitutional: Negative for fever and chills.  Respiratory: Negative  for cough.   Cardiovascular: Negative for chest pain.  Gastrointestinal: Positive for vomiting and abdominal pain. Negative for diarrhea.  Musculoskeletal: Positive for back pain.  Skin: Negative for color change.  Neurological: Negative for headaches.  Psychiatric/Behavioral: Negative for agitation.  All other systems reviewed and are negative.    Allergies  Codeine and Doxycycline  Home Medications   Current Outpatient Rx  Name  Route  Sig  Dispense  Refill  . ALLOPURINOL 300 MG PO TABS   Oral   Take 300 mg by mouth daily.           [name] AMLODIPINE BESYLATE 5 MG PO TABS   Oral   Take 5 mg by mouth daily.         [name] CINACALCET HCL 90 MG PO TABS   Oral   Take 90 mg by mouth daily.         [name] DOXAZOSIN MESYLATE 8 MG PO TABS   Oral   Take 8 mg by mouth daily.           [name] GLIMEPIRIDE 4 MG PO TABS   Oral   Take 4 mg by mouth daily before breakfast.         . LANTHANUM CARBONATE 1000 MG PO CHEW   Oral   Chew 1,000 mg by mouth 4 (four) times daily. With meals and snacks         .  LATANOPROST 0.005 % OP SOLN   Both Eyes   Place 1 drop into both eyes at bedtime.           [name] LINAGLIPTIN 5 MG PO TABS   Oral   Take 5 mg by mouth daily.         [name] METOPROLOL SUCCINATE ER 50 MG PO TB24   Oral   Take 25 mg by mouth 2 (two) times daily. Take with or immediately following a meal.         . SEVELAMER CARBONATE 800 MG PO TABS   Oral   Take 2,400 mg by mouth 3 (three) times daily before meals.            BP 168/74  Pulse 66  Temp 98.1 F (36.7 C)  Resp 20  SpO2 100%  Physical Exam CONSTITUTIONAL: Well developed/well nourished, uncomfortable appearing HEAD AND FACE: Normocephalic/atraumatic EYES: EOMI/PERRL ENMT: Mucous membranes moist NECK: supple no meningeal signs SPINE:entire spine nontender CV: S1/S2 noted, no murmurs/rubs/gallops noted LUNGS: Lungs are clear to auscultation bilaterally, no apparent distress ABDOMEN: soft, right upper  quadrant and right flank tenderness, tenderness is moderate, no rebound or guarding GU:no cva tenderness NEURO: Pt is awake/alert, moves all extremitiesx4 EXTREMITIES: pulses normal, full ROM. HD access noted to right UE with thrill SKIN: warm, color normal PSYCH: no abnormalities of mood noted  ED Course  Procedures   Labs Reviewed  CBC WITH DIFFERENTIAL - Abnormal; Notable for the following:    WBC 11.7 (*)     RBC 3.83 (*)     Hemoglobin 11.4 (*)     HCT 35.5 (*)     Neutrophils Relative 85 (*)     Neutro Abs 9.9 (*)     Lymphocytes Relative 8 (*)     All other components within normal limits  COMPREHENSIVE METABOLIC PANEL - Abnormal; Notable for the following:    Chloride 94 (*)     Glucose, Bld 221 (*)     Creatinine, Ser 7.00 (*)     GFR calc non Af Amer 7 (*)     GFR calc Af Amer 9 (*)     All other components within normal limits  LIPASE, BLOOD   3:25 PM Pt with acute onset of vomiting/right abdominal/flank pain.  Will need CT imaging of abdomen.  He only makes urine/day so unable to capture at this time.  Will also obtain EKG  5:42 PM Pt still with pain.  CT imaging pending Will place in CDU pending CT imaging Stable at this time   MDM  Nursing notes including past medical history and social history reviewed and considered in documentation Labs/vital reviewed and considered        Date: 10/01/2012  Rate: 76  Rhythm: normal sinus rhythm  QRS Axis: normal  Intervals: normal  ST/T Wave abnormalities: nonspecific ST changes  Conduction Disutrbances:none  Narrative Interpretation:   Old EKG Reviewed: none available     Sharyon Cable, MD 10/01/12 1742

## 2012-10-01 NOTE — ED Notes (Signed)
CT aware pt finished drinking contrast 

## 2012-10-01 NOTE — ED Notes (Signed)
Was at dialysis this am x 3 hours when he started to vomit he finished  And thern came here rt abd pain also only voids x 1 a day

## 2012-10-01 NOTE — ED Notes (Signed)
Pt has vomited a large amount. Pa is aware

## 2012-10-01 NOTE — ED Notes (Signed)
Pt reports pain over right flank area.

## 2012-10-01 NOTE — ED Notes (Signed)
Was tx recently for uti

## 2012-10-01 NOTE — H&P (Signed)
[name] is an 66 y.o. male.   Patient was seen and examined on October 01, 2012. PCP - Dr. Kevan Ny. Chief Complaint: Right flank pain. HPI: 66 year old male with history of ESRD on hemodialysis, diabetes mellitus type 2 and hypertension started experiencing nausea and vomiting with right flank pain during dialysis today. Had multiple episodes of nausea vomiting and was brought to the ER. He has had completed his dialysis today. CT abdomen pelvis done showed right-sided hydronephrosis with ureteral stone. On-call urologist Dr. Jeffie Pollock was consulted and at this time they will be seeing patient in consult (patient is known to urologist Dr. Jasmine December) and patient will be admitted for further management. Patient has had recent urinary tract infections and recent cultures done last one showed Escherichia coli sensitive to ceftriaxone and also Zosyn.  Past Medical History  Diagnosis Date  . Type II or unspecified type diabetes mellitus without mention of complication, not stated as uncontrolled   . End stage renal disease   . Essential hypertension, malignant   . Hypertrophy of prostate without urinary obstruction and other lower urinary tract symptoms (LUTS)   . Pure hypercholesterolemia   . Renal insufficiency     Past Surgical History  Procedure Date  . Back surgery     Family History  Problem Relation Age of Onset  . Hypertension Brother   . Diabetes Mellitus II Brother    Social History:  reports that he has never smoked. He does not have any smokeless tobacco history on file. He reports that he does not drink alcohol. His drug history not on file.  Allergies:  Allergies  Allergen Reactions  . Codeine Nausea And Vomiting  . Doxycycline Nausea And Vomiting     (Not in a hospital admission)  Results for orders placed during the hospital encounter of 10/01/12 (from the past 48 hour(s))  CBC WITH DIFFERENTIAL     Status: Abnormal   Collection Time   10/01/12  2:05 PM       Component Value Range Comment   WBC 11.7 (*) 4.0 - 10.5 K/uL    RBC 3.83 (*) 4.22 - 5.81 MIL/uL    Hemoglobin 11.4 (*) 13.0 - 17.0 g/dL    HCT 35.5 (*) 39.0 - 52.0 %    MCV 92.7  78.0 - 100.0 fL    MCH 29.8  26.0 - 34.0 pg    MCHC 32.1  30.0 - 36.0 g/dL    RDW 15.1  11.5 - 15.5 %    Platelets 213  150 - 400 K/uL    Neutrophils Relative 85 (*) 43 - 77 %    Neutro Abs 9.9 (*) 1.7 - 7.7 K/uL    Lymphocytes Relative 8 (*) 12 - 46 %    Lymphs Abs 0.9  0.7 - 4.0 K/uL    Monocytes Relative 8  3 - 12 %    Monocytes Absolute 0.9  0.1 - 1.0 K/uL    Eosinophils Relative 0  0 - 5 %    Eosinophils Absolute 0.0  0.0 - 0.7 K/uL    Basophils Relative 0  0 - 1 %    Basophils Absolute 0.0  0.0 - 0.1 K/uL   COMPREHENSIVE METABOLIC PANEL     Status: Abnormal   Collection Time   10/01/12  2:05 PM      Component Value Range Comment   Sodium 138  135 - 145 mEq/L    Potassium 4.0  3.5 - 5.1 mEq/L  Chloride 94 (*) 96 - 112 mEq/L    CO2 28  19 - 32 mEq/L    Glucose, Bld 221 (*) 70 - 99 mg/dL    BUN 18  6 - 23 mg/dL    Creatinine, Ser 7.00 (*) 0.50 - 1.35 mg/dL    Calcium 9.4  8.4 - 10.5 mg/dL    Total Protein 8.2  6.0 - 8.3 g/dL    Albumin 3.9  3.5 - 5.2 g/dL    AST 14  0 - 37 U/L    ALT 8  0 - 53 U/L    Alkaline Phosphatase 91  39 - 117 U/L    Total Bilirubin 0.6  0.3 - 1.2 mg/dL    GFR calc non Af Amer 7 (*) >90 mL/min    GFR calc Af Amer 9 (*) >90 mL/min   LIPASE, BLOOD     Status: Normal   Collection Time   10/01/12  2:05 PM      Component Value Range Comment   Lipase 34  11 - 59 U/L   TROPONIN I     Status: Normal   Collection Time   10/01/12  2:05 PM      Component Value Range Comment   Troponin I <0.30  <0.30 ng/mL    Ct Abdomen Pelvis W Contrast  10/01/2012  *RADIOLOGY REPORT*  Clinical Data: Abdomen and back pain.  Vomiting.  CT ABDOMEN AND PELVIS WITH CONTRAST  Technique:  Multidetector CT imaging of the abdomen and pelvis was performed following the standard protocol during  bolus administration of intravenous contrast.  Contrast: 144mL OMNIPAQUE IOHEXOL 300 MG/ML  SOLN  Comparison: 08/27/2012.  Findings: Small sliding hiatal hernia.  Multiple colonic diverticula without evidence of diverticulitis.  Bilateral renal calcifications and parapelvic and cortical cysts are unchanged. Essentially empty urinary bladder.  Interval multiple tiny calculi in the distal right ureter with interval mild dilatation of the right ureter and renal collecting system. Interval right perinephric soft tissue stranding.  Bilateral inguinal hernias containing fat are again demonstrated. No enlarged lymph nodes.  Mildly enlarged prostate gland.  Stable left anterior subcutaneous mass.  Unremarkable liver, spleen, pancreas, gallbladder and adrenal glands.  Minimal dependent atelectasis at the lung bases.  Lumbar and lower thoracic spine degenerative changes.  IMPRESSION:  1.  Interval multiple tiny calculi in the distal right ureter with interval mild right hydronephrosis and hydroureter. 2.  Otherwise, stable bilateral renal calculi and cysts. 3.  Stable bilateral inguinal hernias containing fat. 4.  Stable left anterior abdominal subcutaneous mass, possibly representing a sebaceous cyst. 5.  Small sliding hiatal hernia. 6.  Colonic diverticulosis.   Original Report Authenticated By: Claudie Revering, M.D.     Review of Systems  Constitutional: Negative.   HENT: Negative.   Eyes: Negative.   Respiratory: Negative.   Cardiovascular: Negative.   Gastrointestinal: Positive for nausea, vomiting and abdominal pain.  Genitourinary: Positive for flank pain.  Musculoskeletal: Negative.   Skin: Negative.   Neurological: Negative.   Endo/Heme/Allergies: Negative.   Psychiatric/Behavioral: Negative.     Blood pressure 177/83, pulse 82, temperature 98.1 F (36.7 C), resp. rate 20, SpO2 93.00%. Physical Exam  Constitutional: He is oriented to person, place, and time. He appears well-developed and  well-nourished. No distress.  HENT:  Head: Normocephalic and atraumatic.  Right Ear: External ear normal.  Left Ear: External ear normal.  Nose: Nose normal.  Mouth/Throat: Oropharynx is clear and moist. No oropharyngeal exudate.  Eyes: Conjunctivae normal are normal.  Pupils are equal, round, and reactive to light. Right eye exhibits no discharge. Left eye exhibits no discharge. No scleral icterus.  Neck: Normal range of motion. Neck supple.  Cardiovascular: Normal rate and regular rhythm.   Respiratory: Effort normal and breath sounds normal. No respiratory distress. He has no wheezes. He has no rales.  GI: Soft. Bowel sounds are normal. He exhibits no distension. There is no tenderness. There is no rebound.  Musculoskeletal: He exhibits no edema and no tenderness.  Neurological: He is alert and oriented to person, place, and time.       Moves all extremities.  Skin: Skin is warm and dry. He is not diaphoretic.     Assessment/Plan #1. Complicated urine tract infection with right-sided obstructing stone of the kidney - patient has been started on empiric antibiotics Zosyn. Follow urine cultures. At this time patient does not look like his sepsis. Closely observe in telemetry. For urology recommendations. Patient will be kept n.p.o. after midnight in anticipation of possible procedure. #2. ESRD on hemodialysis - Monday Wednesday Friday - consult nephrology for dialysis. #3. Hypertension - since patient is n.p.o. we'll keep patient on when necessary IV hydralazine for systolic blood pressure more than 160. #4. Diabetes mellitus type 2 - CBG every 4 sliding scale until patient starts taking orally. #5. Anemia probably from chronic kidney disease - follow CBC.  CODE STATUS - full code.  Rise Patience. 10/01/2012, 9:59 PM

## 2012-10-02 ENCOUNTER — Encounter (HOSPITAL_COMMUNITY): Payer: Self-pay | Admitting: General Practice

## 2012-10-02 ENCOUNTER — Other Ambulatory Visit: Payer: Self-pay | Admitting: Urology

## 2012-10-02 DIAGNOSIS — D649 Anemia, unspecified: Secondary | ICD-10-CM

## 2012-10-02 DIAGNOSIS — E119 Type 2 diabetes mellitus without complications: Secondary | ICD-10-CM

## 2012-10-02 LAB — CBC
HCT: 35 % — ABNORMAL LOW (ref 39.0–52.0)
MCH: 29.5 pg (ref 26.0–34.0)
MCHC: 32 g/dL (ref 30.0–36.0)
MCV: 92.1 fL (ref 78.0–100.0)
Platelets: 222 10*3/uL (ref 150–400)
RDW: 15.7 % — ABNORMAL HIGH (ref 11.5–15.5)
WBC: 20.5 10*3/uL — ABNORMAL HIGH (ref 4.0–10.5)

## 2012-10-02 LAB — BASIC METABOLIC PANEL
BUN: 29 mg/dL — ABNORMAL HIGH (ref 6–23)
CO2: 30 mEq/L (ref 19–32)
Calcium: 9.3 mg/dL (ref 8.4–10.5)
Creatinine, Ser: 9.46 mg/dL — ABNORMAL HIGH (ref 0.50–1.35)
Glucose, Bld: 135 mg/dL — ABNORMAL HIGH (ref 70–99)

## 2012-10-02 LAB — GLUCOSE, CAPILLARY: Glucose-Capillary: 86 mg/dL (ref 70–99)

## 2012-10-02 MED ORDER — ONDANSETRON HCL 4 MG PO TABS: 4.0000 mg | ORAL_TABLET | Freq: Four times a day (QID) | ORAL | Status: DC | PRN

## 2012-10-02 MED ORDER — LANTHANUM CARBONATE 500 MG PO CHEW
1000.0000 mg | CHEWABLE_TABLET | Freq: Every day | ORAL | Status: DC | PRN
  Filled 2012-10-02: qty 2

## 2012-10-02 MED ORDER — DOXAZOSIN MESYLATE 8 MG PO TABS
8.0000 mg | ORAL_TABLET | Freq: Every day | ORAL | Status: DC
  Administered 2012-10-02 – 2012-10-04 (×3): 8 mg via ORAL
  Filled 2012-10-02 (×3): qty 1

## 2012-10-02 MED ORDER — LINAGLIPTIN 5 MG PO TABS
5.0000 mg | ORAL_TABLET | Freq: Every day | ORAL | Status: DC
  Administered 2012-10-02 – 2012-10-04 (×2): 5 mg via ORAL
  Filled 2012-10-02 (×4): qty 1

## 2012-10-02 MED ORDER — CINACALCET HCL 30 MG PO TABS: 90.0000 mg | ORAL_TABLET | Freq: Every day | ORAL | Status: DC

## 2012-10-02 MED ORDER — SODIUM CHLORIDE 0.9 % IJ SOLN
3.0000 mL | Freq: Two times a day (BID) | INTRAMUSCULAR | Status: DC
  Administered 2012-10-02 – 2012-10-04 (×4): 3 mL via INTRAVENOUS

## 2012-10-02 MED ORDER — PIPERACILLIN-TAZOBACTAM IN DEX 2-0.25 GM/50ML IV SOLN
2.2500 g | Freq: Three times a day (TID) | INTRAVENOUS | Status: DC
  Administered 2012-10-02 – 2012-10-04 (×7): 2.25 g via INTRAVENOUS
  Filled 2012-10-02 (×10): qty 50

## 2012-10-02 MED ORDER — ACETAMINOPHEN 650 MG RE SUPP: 650.0000 mg | Freq: Four times a day (QID) | RECTAL | Status: DC | PRN

## 2012-10-02 MED ORDER — LANTHANUM CARBONATE 500 MG PO CHEW
1000.0000 mg | CHEWABLE_TABLET | Freq: Four times a day (QID) | ORAL | Status: DC
Start: 2012-10-02 — End: 2012-10-02

## 2012-10-02 MED ORDER — AMLODIPINE BESYLATE 5 MG PO TABS
5.0000 mg | ORAL_TABLET | Freq: Every day | ORAL | Status: DC
  Administered 2012-10-02 – 2012-10-03 (×2): 5 mg via ORAL
  Filled 2012-10-02 (×3): qty 1

## 2012-10-02 MED ORDER — SEVELAMER CARBONATE 800 MG PO TABS
2400.0000 mg | ORAL_TABLET | Freq: Three times a day (TID) | ORAL | Status: DC
  Administered 2012-10-03 – 2012-10-04 (×2): 2400 mg via ORAL
  Filled 2012-10-02 (×10): qty 3

## 2012-10-02 MED ORDER — INSULIN ASPART 100 UNIT/ML ~~LOC~~ SOLN
0.0000 [IU] | SUBCUTANEOUS | Status: DC
  Administered 2012-10-02 – 2012-10-03 (×2): 2 [IU] via SUBCUTANEOUS

## 2012-10-02 MED ORDER — PARICALCITOL 5 MCG/ML IV SOLN
6.0000 ug | INTRAVENOUS | Status: DC
  Administered 2012-10-03: 6 ug via INTRAVENOUS
  Filled 2012-10-02: qty 1.2

## 2012-10-02 MED ORDER — INSULIN ASPART 100 UNIT/ML ~~LOC~~ SOLN: 0.0000 [IU] | Freq: Three times a day (TID) | SUBCUTANEOUS | Status: DC

## 2012-10-02 MED ORDER — MORPHINE SULFATE 2 MG/ML IJ SOLN
1.0000 mg | INTRAMUSCULAR | Status: DC | PRN
  Administered 2012-10-02 (×3): 1 mg via INTRAVENOUS
  Filled 2012-10-02 (×3): qty 1

## 2012-10-02 MED ORDER — ONDANSETRON HCL 4 MG/2ML IJ SOLN: 4.0000 mg | Freq: Four times a day (QID) | INTRAMUSCULAR | Status: DC | PRN

## 2012-10-02 MED ORDER — HYDRALAZINE HCL 20 MG/ML IJ SOLN
10.0000 mg | INTRAMUSCULAR | Status: DC | PRN
  Filled 2012-10-02: qty 0.5

## 2012-10-02 MED ORDER — ALLOPURINOL 300 MG PO TABS
300.0000 mg | ORAL_TABLET | Freq: Every day | ORAL | Status: DC
  Administered 2012-10-02 – 2012-10-04 (×3): 300 mg via ORAL
  Filled 2012-10-02 (×3): qty 1

## 2012-10-02 MED ORDER — METOPROLOL SUCCINATE ER 25 MG PO TB24
25.0000 mg | ORAL_TABLET | Freq: Two times a day (BID) | ORAL | Status: DC
  Administered 2012-10-02 – 2012-10-04 (×6): 25 mg via ORAL
  Filled 2012-10-02 (×7): qty 1

## 2012-10-02 MED ORDER — CINACALCET HCL 30 MG PO TABS
90.0000 mg | ORAL_TABLET | Freq: Every day | ORAL | Status: DC
  Administered 2012-10-03 – 2012-10-04 (×2): 90 mg via ORAL
  Filled 2012-10-02 (×5): qty 3

## 2012-10-02 MED ORDER — GLIMEPIRIDE 4 MG PO TABS
4.0000 mg | ORAL_TABLET | Freq: Every day | ORAL | Status: DC
  Administered 2012-10-03 – 2012-10-04 (×2): 4 mg via ORAL
  Filled 2012-10-02 (×4): qty 1

## 2012-10-02 MED ORDER — LANTHANUM CARBONATE 500 MG PO CHEW
1000.0000 mg | CHEWABLE_TABLET | Freq: Three times a day (TID) | ORAL | Status: DC
  Administered 2012-10-03 – 2012-10-04 (×2): 1000 mg via ORAL
  Filled 2012-10-02 (×10): qty 2

## 2012-10-02 MED ORDER — LATANOPROST 0.005 % OP SOLN
1.0000 [drp] | Freq: Every day | OPHTHALMIC | Status: DC
  Administered 2012-10-02 – 2012-10-03 (×2): 1 [drp] via OPHTHALMIC
  Filled 2012-10-02: qty 2.5

## 2012-10-02 MED ORDER — SODIUM CHLORIDE 0.9 % IJ SOLN
3.0000 mL | Freq: Two times a day (BID) | INTRAMUSCULAR | Status: DC
  Administered 2012-10-02 (×3): 3 mL via INTRAVENOUS

## 2012-10-02 MED ORDER — ACETAMINOPHEN 325 MG PO TABS: 650.0000 mg | ORAL_TABLET | Freq: Four times a day (QID) | ORAL | Status: DC | PRN

## 2012-10-02 NOTE — Progress Notes (Signed)
[facility]                                                                                Patient Demographics  [name], is a 66 y.o. male  [id number]  [id number]  DOB - [birth year]  Admit date - 10/01/2012  Admitting Physician Rise Patience, MD  Outpatient Primary MD for the patient is Marlou Sa, ERIC, MD  LOS - 1   Chief Complaint  Patient presents with  . Emesis        Assessment & Plan    #1. Complicated urine tract infection with right-sided obstructing stone of the kidney - patient has been started on empiric antibiotics Zosyn. Follow urine cultures. At this time patient does not look like his sepsis. Closely observe in telemetry. I have again discussed the case with urologist on call Dr.Delhsadt who suggested Dr. Jeffie Pollock will see the patient shortly. Patient will be kept n.p.o. after midnight in anticipation of possible procedure.     #2. ESRD on hemodialysis - Monday Wednesday Friday - have consulted nephrology for dialysis.     #3. Hypertension - home meds with sips,  we'll keep patient on when necessary IV hydralazine for systolic blood pressure more than 160.     #4. Diabetes mellitus type 2 - CBG every 4 sliding scale until patient starts taking orally. Amaryl held today will be given first dose tomorrow with breakfast.  CBG (last 3)   Basename 10/02/12 0413 10/02/12 0121  GLUCAP 160* 198*      #5. Mild Anemia probably from chronic kidney disease - follow CBC.     Code Status: Full  Family Communication: Discussed with the patient and wife bedside  Disposition Plan: Home   Procedures    Consults  urology, nephrology   DVT Prophylaxis   SCDs    Lab Results  Component Value Date   PLT 222 10/02/2012    Medications  Scheduled Meds:   . allopurinol  300 mg Oral Daily  . amLODipine  5 mg Oral Daily  . cinacalcet  90 mg Oral Q breakfast  . doxazosin  8 mg Oral Daily  . glimepiride  4 mg Oral QAC  breakfast  . insulin aspart  0-9 Units Subcutaneous Q4H  . lanthanum  1,000 mg Oral TID WC  . latanoprost  1 drop Both Eyes QHS  . linagliptin  5 mg Oral Daily  . metoprolol succinate  25 mg Oral BID  . piperacillin-tazobactam (ZOSYN)  IV  2.25 g Intravenous Q8H  . sevelamer  2,400 mg Oral TID AC  . sodium chloride  3 mL Intravenous Q12H  . sodium chloride  3 mL Intravenous Q12H   Continuous Infusions:  PRN Meds:.acetaminophen, acetaminophen, hydrALAZINE, lanthanum, morphine injection, ondansetron (ZOFRAN) IV, ondansetron  Antibiotics     Anti-infectives     Start     Dose/Rate Route Frequency Ordered Stop   10/02/12 0100  piperacillin-tazobactam (ZOSYN) IVPB 2.25 g       2.25 g 100 mL/hr over 30 Minutes Intravenous 3 times per day 10/02/12 0034     10/01/12 1915   cefTRIAXone (ROCEPHIN) 1 g in dextrose 5 % 50 mL IVPB  1 g 100 mL/hr over 30 Minutes Intravenous  Once 10/01/12 1912 10/01/12 2300           Time Spent in minutes   45   Tamura Lasky K M.D on 10/02/2012 at 7:40 AM  Between 7am to 7pm - Pager - [phone]  After 7pm go to [email or web address] - password [email or web address]  And look for the night coverage person covering for me after hours  [facility] Office  [phone]    Subjective:   Makai Corris today has, No headache, No chest pain, No abdominal pain - No Nausea, No new weakness tingling or numbness, No Cough - SOB. Mild right flank pain.  Objective:   Filed Vitals:   10/01/12 2200 10/01/12 2301 10/01/12 2324 10/02/12 0417  BP: 169/74  155/69 158/75  Pulse: 78  83 81  Temp:  99.8 F (37.7 C) 99.2 F (37.3 C) 99.7 F (37.6 C)  TempSrc:  Oral Oral Oral  Resp: 21  20 18   Height:   6' (1.829 m)   Weight:   90.9 kg (200 lb 6.4 oz)   SpO2: 99%  100% 100%    Wt Readings from Last 3 Encounters:  10/01/12 90.9 kg (200 lb 6.4 oz)  12/14/10 93.441 kg (206 lb)     Intake/Output Summary (Last 24 hours) at 10/02/12 0740 Last data filed  at 10/02/12 0418  Gross per 24 hour  Intake      0 ml  Output      0 ml  Net      0 ml    Exam Awake Alert, Oriented X 3, No new F.N deficits, Normal affect [place].AT,PERRAL Supple Neck,No JVD, No cervical lymphadenopathy appriciated.  Symmetrical Chest wall movement, Good air movement bilaterally, CTAB RRR,No Gallops,Rubs or new Murmurs, No Parasternal Heave +ve B.Sounds, Abd Soft, Non tender, No organomegaly appriciated, No rebound - guarding or rigidity. No Cyanosis, Clubbing or edema, No new Rash or bruise      Data Review   Micro Results No results found for this or any previous visit (from the past 240 hour(s)).  Radiology Reports Ct Abdomen Pelvis W Contrast  10/01/2012  *RADIOLOGY REPORT*  Clinical Data: Abdomen and back pain.  Vomiting.  CT ABDOMEN AND PELVIS WITH CONTRAST  Technique:  Multidetector CT imaging of the abdomen and pelvis was performed following the standard protocol during bolus administration of intravenous contrast.  Contrast: 186mL OMNIPAQUE IOHEXOL 300 MG/ML  SOLN  Comparison: 08/27/2012.  Findings: Small sliding hiatal hernia.  Multiple colonic diverticula without evidence of diverticulitis.  Bilateral renal calcifications and parapelvic and cortical cysts are unchanged. Essentially empty urinary bladder.  Interval multiple tiny calculi in the distal right ureter with interval mild dilatation of the right ureter and renal collecting system. Interval right perinephric soft tissue stranding.  Bilateral inguinal hernias containing fat are again demonstrated. No enlarged lymph nodes.  Mildly enlarged prostate gland.  Stable left anterior subcutaneous mass.  Unremarkable liver, spleen, pancreas, gallbladder and adrenal glands.  Minimal dependent atelectasis at the lung bases.  Lumbar and lower thoracic spine degenerative changes.  IMPRESSION:  1.  Interval multiple tiny calculi in the distal right ureter with interval mild right hydronephrosis and hydroureter. 2.   Otherwise, stable bilateral renal calculi and cysts. 3.  Stable bilateral inguinal hernias containing fat. 4.  Stable left anterior abdominal subcutaneous mass, possibly representing a sebaceous cyst. 5.  Small sliding hiatal hernia. 6.  Colonic diverticulosis.   Original Report Authenticated By: Claudie Revering, M.D.  CBC  Lab 10/02/12 0540 10/01/12 1405  WBC 20.5* 11.7*  HGB 11.2* 11.4*  HCT 35.0* 35.5*  PLT 222 213  MCV 92.1 92.7  MCH 29.5 29.8  MCHC 32.0 32.1  RDW 15.7* 15.1  LYMPHSABS -- 0.9  MONOABS -- 0.9  EOSABS -- 0.0  BASOSABS -- 0.0  BANDABS -- --    Chemistries   Lab 10/02/12 0540 10/01/12 1405  NA 141 138  K 4.4 4.0  CL 97 94*  CO2 30 28  GLUCOSE 135* 221*  BUN 29* 18  CREATININE 9.46* 7.00*  CALCIUM 9.3 9.4  MG -- --  AST -- 14  ALT -- 8  ALKPHOS -- 91  BILITOT -- 0.6   ------------------------------------------------------------------------------------------------------------------ estimated creatinine clearance is 8.7 ml/min (by C-G formula based on Cr of 9.46). ------------------------------------------------------------------------------------------------------------------ No results found for this basename: HGBA1C:2 in the last 72 hours ------------------------------------------------------------------------------------------------------------------ No results found for this basename: CHOL:2,HDL:2,LDLCALC:2,TRIG:2,CHOLHDL:2,LDLDIRECT:2 in the last 72 hours ------------------------------------------------------------------------------------------------------------------ No results found for this basename: TSH,T4TOTAL,FREET3,T3FREE,THYROIDAB in the last 72 hours ------------------------------------------------------------------------------------------------------------------ No results found for this basename: VITAMINB12:2,FOLATE:2,FERRITIN:2,TIBC:2,IRON:2,RETICCTPCT:2 in the last 72 hours  Coagulation profile No results found for this basename:  INR:5,PROTIME:5 in the last 168 hours  No results found for this basename: DDIMER:2 in the last 72 hours  Cardiac Enzymes  Lab 10/01/12 1405  CKMB --  TROPONINI <0.30  MYOGLOBIN --   ------------------------------------------------------------------------------------------------------------------ No components found with this basename: POCBNP:3

## 2012-10-02 NOTE — Progress Notes (Signed)
After strained pt's urine,very small pieces were noticed on the strainer,they look like tiny stones.keep monitoring closely.

## 2012-10-02 NOTE — Consult Note (Signed)
Urology Consult  Requesting provider:  Dr. Hal Hope.  CC: Right ureter stone. UTI.  HPI: 66 year old male admitted for abdominal pain. He has ERSD and is on dialysis. CT scan 10/01/12 revealed a right distal ureter stone. This was just a few millimeters in size. It is located very close to the right ureterovesical junction. There is also possibly a more proximal stone just a few millimeters away from this. It is associated with right-sided hydronephrosis. The patient's UA was concerning for urinary tract infection, and he is recently had a urinary tract infection. He states he began having symptoms yesterday. He started having right-sided flank pain. This was sharp in nature. It was associated with nausea. It is improved narcotic. It is not associated with fever.  Today we discussed management options. I explained that observation is a possibility, but I'm concerned given his possible urinary tract infection and the fact that he does not produced much urine since he is on dialysis. I feel that it would be difficult for him to pass the stone, but not impossible. Shock wave lithotripsy would not be a good option given the fact that he likely has a urinary tract infection. The other option would be cystoscopy with ureteroscopy and laser lithotripsy. We discussed the risks, benefits, alternatives, and likelihood of achieving goals.  PMH: Past Medical History  Diagnosis Date  . Type II or unspecified type diabetes mellitus without mention of complication, not stated as uncontrolled   . End stage renal disease   . Essential hypertension, malignant   . Hypertrophy of prostate without urinary obstruction and other lower urinary tract symptoms (LUTS)   . Pure hypercholesterolemia   . Renal insufficiency     PSH: Past Surgical History  Procedure Date  . Back surgery     Allergies: Allergies  Allergen Reactions  . Codeine Nausea And Vomiting  . Doxycycline Nausea And Vomiting     Medications: Prescriptions prior to admission  Medication Sig Dispense Refill  . allopurinol (ZYLOPRIM) 300 MG tablet Take 300 mg by mouth daily.        [name] amLODipine (NORVASC) 5 MG tablet Take 5 mg by mouth daily.      . cinacalcet (SENSIPAR) 90 MG tablet Take 90 mg by mouth daily.      [name] doxazosin (CARDURA) 8 MG tablet Take 8 mg by mouth daily.        [name] glimepiride (AMARYL) 4 MG tablet Take 4 mg by mouth daily before breakfast.      . lanthanum (FOSRENOL) 1000 MG chewable tablet Chew 1,000 mg by mouth 4 (four) times daily. With meals and snacks      . latanoprost (XALATAN) 0.005 % ophthalmic solution Place 1 drop into both eyes at bedtime.        [name] linagliptin (TRADJENTA) 5 MG TABS tablet Take 5 mg by mouth daily.      . metoprolol succinate (TOPROL-XL) 50 MG 24 hr tablet Take 25 mg by mouth 2 (two) times daily. Take with or immediately following a meal.      . sevelamer (RENVELA) 800 MG tablet Take 2,400 mg by mouth 3 (three) times daily before meals.          Social History: History   Social History  . Marital Status: Married    Spouse Name: N/A    Number of Children: N/A  . Years of Education: N/A   Occupational History  . Not on file.   Social History Main Topics  .  Smoking status: Never Smoker   . Smokeless tobacco: Not on file  . Alcohol Use: No  . Drug Use:   . Sexually Active:    Other Topics Concern  . Not on file   Social History Narrative  . No narrative on file    Family History: Family History  Problem Relation Age of Onset  . Hypertension Brother   . Diabetes Mellitus II Brother     Review of Systems: Positive: Right flank pain, nausea. Negative: Fever, chest pain, or gross hematuria.  A further 10 point review of systems was negative except what is listed in the HPI.  Physical Exam: Filed Vitals:   10/02/12 0417  BP: 158/75  Pulse: 81  Temp: 99.7 F (37.6 C)  Resp: 18    General: No acute distress.  Awake. Head:  Normocephalic.   Atraumatic. ENT:  EOMI.  Mucous membranes moist Neck:  Supple.  No lymphadenopathy. CV:  S1 present. S2 present. Regular rate. Pulmonary: Equal effort bilaterally.  Clear to auscultation bilaterally. Abdomen: Soft.  Non- tender to palpation. Skin:  Normal turgor.  No visible rash. Extremity: No gross deformity of bilateral upper extremities.  No gross deformity of    bilateral lower extremities. Neurologic: Alert. Appropriate mood.    Studies:  Recent Labs  [facility] 10/02/12 0540 10/01/12 1405   HGB 11.2* 11.4*   WBC 20.5* 11.7*   PLT 222 213    Recent Labs  Basename 10/02/12 0540 10/01/12 1405   NA 141 138   K 4.4 4.0   CL 97 94*   CO2 30 28   BUN 29* 18   CREATININE 9.46* 7.00*   CALCIUM 9.3 9.4   GFRNONAA 5* 7*   GFRAA 6* 9*     No results found for this basename: PT:2,INR:2,APTT:2 in the last 72 hours   No components found with this basename: ABG:2    Assessment:  Right ureter stone. UTI.  Plan: -OK to eat today from by point of view today. -Strain urine. -I will schedule him for surgery tomorrow: cystoscopy, right ureteroscopy, laser lithotripsy, right ureter stent placement. -Continue treatment for UTI. -NPO at midnight tonight. -Surgery will likely be at 17:00 tomorrow, so please see if his dialysis can be scheduled for early in the morning. He will need to be care-linked to [facility] and then back to [facility] tomorrow. -Please be sure and hold any blood thinners.   Cc Dr. Hal Hope.  Pager: [phone]

## 2012-10-02 NOTE — Progress Notes (Signed)
Pt transferred from the ED, admitted to Rm [id number]. Pt comes from home. He is alert and oriented. Ambulatory with standby assist. No skin breakdown noted. Placed on telemetry, running NSR. Oriented to room, instructed to call for assistance before getting out of bed. Resting comfortably at this time, call bell in reach, will continue to monitor

## 2012-10-02 NOTE — Consult Note (Signed)
[facility] Renal Consultation Note  Indication for Consultation:  Management of ESRD/hemodialysis; anemia, hypertension/volume and secondary hyperparathyroidism  HPI: [name] is a 66 y.o. male with ESRD on dialysis on MWF at the [facility] and a history of renal stones who had nausea, vomiting, right flank pain three hours into his dialysis yesterday and presented to the ER, where abdominal CT showed multiple tiny calculi in the distal right ureter with interval mild right hydronephrosis and hydroureter.  His urinalysis also indicated a urinary tract infection, and IV antibiotics were begun.  He was evaluated in the ER on 11/1 for dysuria and urinary frequency, treated for urinary tract infection, and had outpatient appointment on 11/11 with Dr. Rolan Bucco of Urology, who intended to proceed with cystoscopy on 12/19.  Today the patient was seen by Dr. Jasmine December who plans cystoscopy with ureteroscopy and laser lithotripsy tomorrow at [facility].  Currently pain is controlled on morphine, and the patient is comfortable.   Dialysis Orders: Center: [place] [facility] on MWF. EDW 92 kg  HD Bath 2K/2.25Ca  Time 4 hrs  Heparin 8000 U. Access AVF @ RUA BFR 450 DFR 800    Zemplar 6 mcg IV/HD   Epogen 0 Units IV/HD  Venofer 0.  Past Medical History  Diagnosis Date  . Type II or unspecified type diabetes mellitus without mention of complication, not stated as uncontrolled   . End stage renal disease   . Essential hypertension, malignant   . Hypertrophy of prostate without urinary obstruction and other lower urinary tract symptoms (LUTS)   . Pure hypercholesterolemia   . Renal insufficiency   . Arthritis    Past Surgical History  Procedure Date  . Back surgery   . Cervical discectomy 03/1999  . Dialysis fistula creation 2010  . Eye surgery 2012    cataracts   Family History  Problem Relation Age of Onset  . Hypertension Brother   . Diabetes Mellitus II Brother     Social History He reports that he quit smoking about 22 years ago after ten years of cigarettes. He has never used smokeless tobacco. He previously drank occasional alcohol, but denies any history of illicit drug use.  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  Allergies  Allergen Reactions  . Codeine Nausea And Vomiting  . Doxycycline Nausea And Vomiting   Prior to Admission medications   Medication Sig Start Date End Date Taking? Authorizing Provider  allopurinol (ZYLOPRIM) 300 MG tablet Take 300 mg by mouth daily.     Yes Historical Provider, MD  amLODipine (NORVASC) 5 MG tablet Take 5 mg by mouth daily.   Yes Historical Provider, MD  cinacalcet (SENSIPAR) 90 MG tablet Take 90 mg by mouth daily.   Yes Historical Provider, MD  doxazosin (CARDURA) 8 MG tablet Take 8 mg by mouth daily.     Yes Historical Provider, MD  glimepiride (AMARYL) 4 MG tablet Take 4 mg by mouth daily before breakfast.   Yes Historical Provider, MD  lanthanum (FOSRENOL) 1000 MG chewable tablet Chew 1,000 mg by mouth 4 (four) times daily. With meals and snacks   Yes Historical Provider, MD  latanoprost (XALATAN) 0.005 % ophthalmic solution Place 1 drop into both eyes at bedtime.     Yes Historical Provider, MD  linagliptin (TRADJENTA) 5 MG TABS tablet Take 5 mg by mouth daily.   Yes Historical Provider, MD  metoprolol succinate (TOPROL-XL)  50 MG 24 hr tablet Take 25 mg by mouth 2 (two) times daily. Take with or immediately following a meal.   Yes Historical Provider, MD  sevelamer (RENVELA) 800 MG tablet Take 2,400 mg by mouth 3 (three) times daily before meals.    Yes Historical Provider, MD   Labs:  Results for orders placed during the hospital encounter of 10/01/12 (from the past 48 hour(s))  CBC WITH DIFFERENTIAL     Status: Abnormal   Collection Time   10/01/12  2:05 PM      Component Value Range Comment   WBC 11.7 (*) 4.0 - 10.5 K/uL    RBC  3.83 (*) 4.22 - 5.81 MIL/uL    Hemoglobin 11.4 (*) 13.0 - 17.0 g/dL    HCT 35.5 (*) 39.0 - 52.0 %    MCV 92.7  78.0 - 100.0 fL    MCH 29.8  26.0 - 34.0 pg    MCHC 32.1  30.0 - 36.0 g/dL    RDW 15.1  11.5 - 15.5 %    Platelets 213  150 - 400 K/uL    Neutrophils Relative 85 (*) 43 - 77 %    Neutro Abs 9.9 (*) 1.7 - 7.7 K/uL    Lymphocytes Relative 8 (*) 12 - 46 %    Lymphs Abs 0.9  0.7 - 4.0 K/uL    Monocytes Relative 8  3 - 12 %    Monocytes Absolute 0.9  0.1 - 1.0 K/uL    Eosinophils Relative 0  0 - 5 %    Eosinophils Absolute 0.0  0.0 - 0.7 K/uL    Basophils Relative 0  0 - 1 %    Basophils Absolute 0.0  0.0 - 0.1 K/uL   COMPREHENSIVE METABOLIC PANEL     Status: Abnormal   Collection Time   10/01/12  2:05 PM      Component Value Range Comment   Sodium 138  135 - 145 mEq/L    Potassium 4.0  3.5 - 5.1 mEq/L    Chloride 94 (*) 96 - 112 mEq/L    CO2 28  19 - 32 mEq/L    Glucose, Bld 221 (*) 70 - 99 mg/dL    BUN 18  6 - 23 mg/dL    Creatinine, Ser 7.00 (*) 0.50 - 1.35 mg/dL    Calcium 9.4  8.4 - 10.5 mg/dL    Total Protein 8.2  6.0 - 8.3 g/dL    Albumin 3.9  3.5 - 5.2 g/dL    AST 14  0 - 37 U/L    ALT 8  0 - 53 U/L    Alkaline Phosphatase 91  39 - 117 U/L    Total Bilirubin 0.6  0.3 - 1.2 mg/dL    GFR calc non Af Amer 7 (*) >90 mL/min    GFR calc Af Amer 9 (*) >90 mL/min   LIPASE, BLOOD     Status: Normal   Collection Time   10/01/12  2:05 PM      Component Value Range Comment   Lipase 34  11 - 59 U/L   TROPONIN I     Status: Normal   Collection Time   10/01/12  2:05 PM      Component Value Range Comment   Troponin I <0.30  <0.30 ng/mL   GLUCOSE, CAPILLARY     Status: Abnormal   Collection Time   10/02/12  1:21 AM      Component Value Range Comment  Glucose-Capillary 198 (*) 70 - 99 mg/dL    Comment 1 Documented in Chart      Comment 2 Notify RN     GLUCOSE, CAPILLARY     Status: Abnormal   Collection Time   10/02/12  4:13 AM      Component Value Range Comment    Glucose-Capillary 160 (*) 70 - 99 mg/dL    Comment 1 Documented in Chart      Comment 2 Notify RN     BASIC METABOLIC PANEL     Status: Abnormal   Collection Time   10/02/12  5:40 AM      Component Value Range Comment   Sodium 141  135 - 145 mEq/L    Potassium 4.4  3.5 - 5.1 mEq/L    Chloride 97  96 - 112 mEq/L    CO2 30  19 - 32 mEq/L    Glucose, Bld 135 (*) 70 - 99 mg/dL    BUN 29 (*) 6 - 23 mg/dL    Creatinine, Ser 9.46 (*) 0.50 - 1.35 mg/dL    Calcium 9.3  8.4 - 10.5 mg/dL    GFR calc non Af Amer 5 (*) >90 mL/min    GFR calc Af Amer 6 (*) >90 mL/min   CBC     Status: Abnormal   Collection Time   10/02/12  5:40 AM      Component Value Range Comment   WBC 20.5 (*) 4.0 - 10.5 K/uL    RBC 3.80 (*) 4.22 - 5.81 MIL/uL    Hemoglobin 11.2 (*) 13.0 - 17.0 g/dL    HCT 35.0 (*) 39.0 - 52.0 %    MCV 92.1  78.0 - 100.0 fL    MCH 29.5  26.0 - 34.0 pg    MCHC 32.0  30.0 - 36.0 g/dL    RDW 15.7 (*) 11.5 - 15.5 %    Platelets 222  150 - 400 K/uL   GLUCOSE, CAPILLARY     Status: Normal   Collection Time   10/02/12  7:48 AM      Component Value Range Comment   Glucose-Capillary 86  70 - 99 mg/dL    Comment 1 Notify RN      Comment 2 Documented in Chart      Constitutional: negative for chills, fatigue, fevers and sweats Ears, nose, mouth, throat, and face: negative for earaches, hearing loss, hoarseness, nasal congestion and sore throat Respiratory: negative for cough, dyspnea on exertion, hemoptysis and sputum Cardiovascular: negative for chest pain, dyspnea, orthopnea and palpitations Gastrointestinal: positive for nausea and vomiting, negative for change in bowel habits Genitourinary:oliguric, but with urgency and dysuria; negative for hematuria Musculoskeletal:positive for right flank pain; negative for arthralgias, myalgias and neck pain Neurological: negative for dizziness, gait problems, headaches and vertigo  Physical Exam: Filed Vitals:   10/02/12 0745  BP: 140/63   Pulse: 80  Temp: 98 F (36.7 C)  Resp: 18     General appearance: alert, cooperative and no distress Head: Normocephalic, without obvious abnormality, atraumatic Neck: no adenopathy, no carotid bruit, no JVD and supple, symmetrical, trachea midline Resp: clear to auscultation bilaterally Cardio: regular rate and rhythm, S1, S2 normal, no murmur, click, rub or gallop GI: soft, non-tender; bowel sounds normal; no masses,  no organomegaly Extremities: extremities normal, atraumatic, no cyanosis or edema Neurologic: Grossly normal Dialysis Access: AVF @ RUA with + bruit   Assessment/Plan: 1. Renal stones/UTI - Recent recurrent UTIs; R ureter stone per CT;  on IV Zosyn; cystoscopy with ureteroscopy and laser lithotripsy tomorrow per Urology at [facility]. 2. ESRD -  HD on MWF @ [place]; K 4.4.  HD tomorrow morning before procedure. 3. Hypertension/volume  - BP 140/63 on Amlodipine 5 mg qhs, Doxazosin 8 mg qd, and Metoprolol 50 mg bid (hold pre-HD); current wt 90.9 kg with EDW 92. 4. Anemia  - Hgb 11.2, no outpatient Epogen or Fe. 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 6. Nutrition - Alb 3.9. 7. Hx BPH  [name],[name] 10/02/2012, 10:36 AM   Attending Nephrologist: Roney Jaffe, MD  Patient seen and examined and agree with assessment and plan as above.  Kelly Splinter  MD [employer] [phone] pgr    [phone] cell 10/02/2012, 6:24 PM

## 2012-10-02 NOTE — ED Provider Notes (Signed)
Medical screening examination/treatment/procedure(s) were conducted as a shared visit with non-physician practitioner(s) and myself.  I personally evaluated the patient during the encounter   Sharyon Cable, MD 10/02/12 [phone]

## 2012-10-03 ENCOUNTER — Ambulatory Visit: Admit: 2012-10-03 | Payer: 59 | Admitting: Urology

## 2012-10-03 ENCOUNTER — Inpatient Hospital Stay (HOSPITAL_COMMUNITY): Payer: Medicare Other

## 2012-10-03 ENCOUNTER — Encounter (HOSPITAL_COMMUNITY): Payer: Self-pay | Admitting: *Deleted

## 2012-10-03 ENCOUNTER — Encounter (HOSPITAL_COMMUNITY)
Admission: EM | Disposition: A | Discharge status: Home / Self Care | Payer: Self-pay | Source: Home / Self Care | Attending: Internal Medicine

## 2012-10-03 LAB — URINE CULTURE

## 2012-10-03 LAB — RENAL FUNCTION PANEL
BUN: 46 mg/dL — ABNORMAL HIGH (ref 6–23)
CO2: 29 mEq/L (ref 19–32)
Chloride: 92 mEq/L — ABNORMAL LOW (ref 96–112)
GFR calc Af Amer: 4 mL/min — ABNORMAL LOW (ref >90)
Glucose, Bld: 98 mg/dL (ref 70–99)
Potassium: 4.5 mEq/L (ref 3.5–5.1)
Sodium: 137 mEq/L (ref 135–145)

## 2012-10-03 LAB — GLUCOSE, CAPILLARY
Glucose-Capillary: 108 mg/dL — ABNORMAL HIGH (ref 70–99)
Glucose-Capillary: 190 mg/dL — ABNORMAL HIGH (ref 70–99)
Glucose-Capillary: 81 mg/dL (ref 70–99)
Glucose-Capillary: 88 mg/dL (ref 70–99)
Glucose-Capillary: 91 mg/dL (ref 70–99)

## 2012-10-03 LAB — CBC
HCT: 32.8 % — ABNORMAL LOW (ref 39.0–52.0)
Hemoglobin: 10.6 g/dL — ABNORMAL LOW (ref 13.0–17.0)
RBC: 3.57 MIL/uL — ABNORMAL LOW (ref 4.22–5.81)

## 2012-10-03 SURGERY — CYSTOURETEROSCOPY, WITH RETROGRADE PYELOGRAM AND STENT INSERTION
Anesthesia: General | Laterality: Right

## 2012-10-03 MED ORDER — SODIUM CHLORIDE 0.9 % IV SOLN: 100.0000 mL | INTRAVENOUS | Status: DC | PRN

## 2012-10-03 MED ORDER — LIDOCAINE HCL (PF) 1 % IJ SOLN: 5.0000 mL | INTRAMUSCULAR | Status: DC | PRN

## 2012-10-03 MED ORDER — HEPARIN SODIUM (PORCINE) 1000 UNIT/ML DIALYSIS
20.0000 [IU]/kg | INTRAMUSCULAR | Status: DC | PRN
  Filled 2012-10-03: qty 2

## 2012-10-03 MED ORDER — LIDOCAINE-PRILOCAINE 2.5-2.5 % EX CREA: 1.0000 "application " | TOPICAL_CREAM | CUTANEOUS | Status: DC | PRN

## 2012-10-03 MED ORDER — NEPRO/CARBSTEADY PO LIQD: 237.0000 mL | ORAL | Status: DC | PRN

## 2012-10-03 MED ORDER — HEPARIN SODIUM (PORCINE) 1000 UNIT/ML DIALYSIS
1000.0000 [IU] | INTRAMUSCULAR | Status: DC | PRN
  Filled 2012-10-03: qty 1

## 2012-10-03 MED ORDER — PARICALCITOL 5 MCG/ML IV SOLN
INTRAVENOUS | Status: AC
  Administered 2012-10-03: 6 ug via INTRAVENOUS
  Filled 2012-10-03: qty 2

## 2012-10-03 MED ORDER — ALTEPLASE 2 MG IJ SOLR
2.0000 mg | Freq: Once | INTRAMUSCULAR | Status: AC | PRN
  Filled 2012-10-03: qty 2

## 2012-10-03 MED ORDER — PENTAFLUOROPROP-TETRAFLUOROETH EX AERO: 1.0000 "application " | INHALATION_SPRAY | CUTANEOUS | Status: DC | PRN

## 2012-10-03 NOTE — Progress Notes (Signed)
[facility]                                                                                Patient Demographics  [name], is a 66 y.o. male  [id number]  [id number]  DOB - 29-Mar-1946  Admit date - 10/01/2012  Admitting Physician Rise Patience, MD  Outpatient Primary MD for the patient is Marlou Sa, ERIC, MD  LOS - 2   Chief Complaint  Patient presents with  . Emesis        Assessment & Plan    #1. Complicated urine tract infection with right-sided obstructing stone of the kidney - patient has been started on empiric antibiotics Zosyn. Clinically stable, Follow urine cultures. At this time patient does not look like his sepsis. Closely observe in telemetry. Urology is following the patient, he should get repeat CT scan then management per urology. For now he is n.p.o. If no surgery is needed we'll resume renal diet with 1.5 L fluid restriction daily.    #2. ESRD on hemodialysis - Monday Wednesday Friday - have consulted nephrology for dialysis. Getting dialyzed 10/03/2012.    #3. Hypertension - home meds with sips,  we'll keep patient on when necessary IV hydralazine for systolic blood pressure more than 160.     #4. Diabetes mellitus type 2 - CBG every 4 sliding scale until patient starts taking orally. Amaryl held today will be given first dose tomorrow with breakfast.  CBG (last 3)   Basename 10/03/12 0410 10/02/12 2350 10/02/12 2012  GLUCAP 88 91 114*      #5. Mild Anemia probably from chronic kidney disease - follow CBC.     Code Status: Full  Family Communication: Discussed with the patient and wife bedside  Disposition Plan: Home   Procedures    Consults  urology, nephrology   DVT Prophylaxis   SCDs    Lab Results  Component Value Date   PLT 207 10/03/2012    Medications  Scheduled Meds:    . allopurinol  300 mg Oral Daily  . amLODipine  5 mg Oral Daily  . cinacalcet  90 mg Oral Q breakfast  .  doxazosin  8 mg Oral Daily  . glimepiride  4 mg Oral QAC breakfast  . insulin aspart  0-9 Units Subcutaneous Q4H  . lanthanum  1,000 mg Oral TID WC  . latanoprost  1 drop Both Eyes QHS  . linagliptin  5 mg Oral Daily  . metoprolol succinate  25 mg Oral BID  . paricalcitol  6 mcg Intravenous Q M,W,F-HD  . piperacillin-tazobactam (ZOSYN)  IV  2.25 g Intravenous Q8H  . sevelamer  2,400 mg Oral TID AC  . sodium chloride  3 mL Intravenous Q12H  . sodium chloride  3 mL Intravenous Q12H   Continuous Infusions:  PRN Meds:.acetaminophen, acetaminophen, hydrALAZINE, lanthanum, morphine injection, ondansetron (ZOFRAN) IV, ondansetron  Antibiotics     Anti-infectives     Start     Dose/Rate Route Frequency Ordered Stop   10/02/12 0100   piperacillin-tazobactam (ZOSYN) IVPB 2.25 g        2.25 g 100 mL/hr over 30 Minutes Intravenous 3 times per day 10/02/12  0034     10/01/12 1915   cefTRIAXone (ROCEPHIN) 1 g in dextrose 5 % 50 mL IVPB        1 g 100 mL/hr over 30 Minutes Intravenous  Once 10/01/12 1912 10/01/12 2300           Time Spent in minutes   45   Ason Heslin K M.D on 10/03/2012 at 11:31 AM  Between 7am to 7pm - Pager - [phone]  After 7pm go to [email or web address] - password [email or web address]  And look for the night coverage person covering for me after hours  [facility] Office  [phone]    Subjective:   [name] today has, No headache, No chest pain, No abdominal pain - No Nausea, No new weakness tingling or numbness, No Cough - SOB. Mild right flank pain.  Objective:   Filed Vitals:   10/03/12 0920 10/03/12 0930 10/03/12 1000 10/03/12 1030  BP: 121/64 120/71 116/86 127/66  Pulse: 69 70 71 65  Temp:      TempSrc:      Resp: 15 16 14 21   Height:      Weight:      SpO2:        Wt Readings from Last 3 Encounters:  10/03/12 90.5 kg (199 lb 8.3 oz)  10/03/12 90.5 kg (199 lb 8.3 oz)  12/14/10 93.441 kg (206 lb)     Intake/Output Summary (Last  24 hours) at 10/03/12 1131 Last data filed at 10/02/12 1808  Gross per 24 hour  Intake     50 ml  Output      0 ml  Net     50 ml    Exam Awake Alert, Oriented X 3, No new F.N deficits, Normal affect [place].AT,PERRAL Supple Neck,No JVD, No cervical lymphadenopathy appriciated.  Symmetrical Chest wall movement, Good air movement bilaterally, CTAB RRR,No Gallops,Rubs or new Murmurs, No Parasternal Heave +ve B.Sounds, Abd Soft, Non tender, No organomegaly appriciated, No rebound - guarding or rigidity. No Cyanosis, Clubbing or edema, No new Rash or bruise      Data Review   Micro Results Recent Results (from the past 240 hour(s))  URINE CULTURE     Status: Normal (Preliminary result)   Collection Time   10/01/12  5:31 PM      Component Value Range Status Comment   Specimen Description URINE, CLEAN CATCH   Final    Special Requests NONE   Final    Culture  Setup Time 10/01/2012 18:23   Final    Colony Count >=100,000 COLONIES/ML   Final    Culture ESCHERICHIA COLI   Final    Report Status PENDING   Incomplete     Radiology Reports Ct Abdomen Pelvis W Contrast  10/01/2012  *RADIOLOGY REPORT*  Clinical Data: Abdomen and back pain.  Vomiting.  CT ABDOMEN AND PELVIS WITH CONTRAST  Technique:  Multidetector CT imaging of the abdomen and pelvis was performed following the standard protocol during bolus administration of intravenous contrast.  Contrast: 183mL OMNIPAQUE IOHEXOL 300 MG/ML  SOLN  Comparison: 08/27/2012.  Findings: Small sliding hiatal hernia.  Multiple colonic diverticula without evidence of diverticulitis.  Bilateral renal calcifications and parapelvic and cortical cysts are unchanged. Essentially empty urinary bladder.  Interval multiple tiny calculi in the distal right ureter with interval mild dilatation of the right ureter and renal collecting system. Interval right perinephric soft tissue stranding.  Bilateral inguinal hernias containing fat are again demonstrated. No  enlarged lymph nodes.  Mildly enlarged  prostate gland.  Stable left anterior subcutaneous mass.  Unremarkable liver, spleen, pancreas, gallbladder and adrenal glands.  Minimal dependent atelectasis at the lung bases.  Lumbar and lower thoracic spine degenerative changes.  IMPRESSION:  1.  Interval multiple tiny calculi in the distal right ureter with interval mild right hydronephrosis and hydroureter. 2.  Otherwise, stable bilateral renal calculi and cysts. 3.  Stable bilateral inguinal hernias containing fat. 4.  Stable left anterior abdominal subcutaneous mass, possibly representing a sebaceous cyst. 5.  Small sliding hiatal hernia. 6.  Colonic diverticulosis.   Original Report Authenticated By: Claudie Revering, M.D.     CBC  Lab 10/03/12 0712 10/02/12 0540 10/01/12 1405  WBC 14.0* 20.5* 11.7*  HGB 10.6* 11.2* 11.4*  HCT 32.8* 35.0* 35.5*  PLT 207 222 213  MCV 91.9 92.1 92.7  MCH 29.7 29.5 29.8  MCHC 32.3 32.0 32.1  RDW 15.8* 15.7* 15.1  LYMPHSABS -- -- 0.9  MONOABS -- -- 0.9  EOSABS -- -- 0.0  BASOSABS -- -- 0.0  BANDABS -- -- --    Chemistries   Lab 10/03/12 0712 10/02/12 0540 10/01/12 1405  NA 137 141 138  K 4.5 4.4 4.0  CL 92* 97 94*  CO2 29 30 28   GLUCOSE 98 135* 221*  BUN 46* 29* 18  CREATININE 13.11* 9.46* 7.00*  CALCIUM 10.4 9.3 9.4  MG -- -- --  AST -- -- 14  ALT -- -- 8  ALKPHOS -- -- 91  BILITOT -- -- 0.6   ------------------------------------------------------------------------------------------------------------------ estimated creatinine clearance is 6.2 ml/min (by C-G formula based on Cr of 13.11). ------------------------------------------------------------------------------------------------------------------ No results found for this basename: HGBA1C:2 in the last 72 hours ------------------------------------------------------------------------------------------------------------------ No results found for this basename:  CHOL:2,HDL:2,LDLCALC:2,TRIG:2,CHOLHDL:2,LDLDIRECT:2 in the last 72 hours ------------------------------------------------------------------------------------------------------------------ No results found for this basename: TSH,T4TOTAL,FREET3,T3FREE,THYROIDAB in the last 72 hours ------------------------------------------------------------------------------------------------------------------ No results found for this basename: VITAMINB12:2,FOLATE:2,FERRITIN:2,TIBC:2,IRON:2,RETICCTPCT:2 in the last 72 hours  Coagulation profile No results found for this basename: INR:5,PROTIME:5 in the last 168 hours  No results found for this basename: DDIMER:2 in the last 72 hours  Cardiac Enzymes  Lab 10/01/12 1405  CKMB --  TROPONINI <0.30  MYOGLOBIN --   ------------------------------------------------------------------------------------------------------------------ No components found with this basename: POCBNP:3

## 2012-10-03 NOTE — Progress Notes (Signed)
Urology Progress Note  Subjective:     No acute urologic events overnight. Patient passed some small fragments, but he could not tell at the time whether it was stone or small blood clots. I evaluated this morning, by they have dried overnight and I cannot tell whether they are stone or not. He continues to have some right flank discomfort, which could be residual stone or simply discomfort from having passed the stones.  We discussed obtaining imaging to assess his ureter stone burden in order to determine whether or not to proceed to the OR. I explained that [place] could not definitively rule out stone, and in the face of infection he needs the stones treated. The stones are too small to be reliably seen on plain films. I recommend repeat CT. The risk of CT is lower than the risk of proceeding to the OR if there are no further stones present.  ROS: Negative: chest pain.  Objective:  Patient Vitals for the past 24 hrs:  BP Temp Temp src Pulse Resp SpO2 Weight  10/03/12 0702 156/81 mmHg - - 72  18  - -  10/03/12 0651 151/81 mmHg 98.8 F (37.1 C) Oral 71  18  99 % 90.5 kg (199 lb 8.3 oz)  10/03/12 0407 123/62 mmHg 99 F (37.2 C) Oral 72  18  95 % -  10/02/12 2004 137/69 mmHg 100.1 F (37.8 C) Oral 80  18  94 % 92 kg (202 lb 13.2 oz)  10/02/12 1807 136/59 mmHg 98 F (36.7 C) Oral 84  18  100 % -  10/02/12 0745 140/63 mmHg 98 F (36.7 C) Oral 80  18  100 % -    Physical Exam: General:  No acute distress, awake Cardiovascular:    [x]   S1/S2 present, RRR  []   Irregularly irregular Chest:  CTA-B Abdomen:               []  Soft, appropriately TTP  [x]  Soft, NTTP  []  Soft, appropriately TTP, incision(s) clean/dry/intact  Genitourinary: No edema Foley:  None    I/O last 3 completed shifts: In: 22 [IV Piggyback:50] Out: -   Recent Labs  [facility] 10/03/12 0712 10/02/12 0540   HGB 10.6* 11.2*   WBC 14.0* 20.5*   PLT 207 222    Recent Labs  Basename 10/02/12 0540 10/01/12 1405   NA 141 138   K 4.4 4.0   CL 97 94*   CO2 30 28   BUN 29* 18   CREATININE 9.46* 7.00*   CALCIUM 9.3 9.4   GFRNONAA 5* 7*   GFRAA 6* 9*     No results found for this basename: PT:2,INR:2,APTT:2 in the last 72 hours   No components found with this basename: ABG:2    Length of stay: 2 days.  Assessment: Right ureter stones. UTI.    Plan: -CT this morning following dialysis (ordered) to assess for remaining stone burden. If stones have passed and hydronephrosis has resolved, will cancel OR case. -If we do proceed to the OR for cystoscopy, right ureteroscopy, laser lithotripsy, and possible right stent placement; we also discussed possible bilateral retrograde pyelograms if indicated at the time of surgery, and he agrees.   Rolan Bucco, MD [phone]

## 2012-10-03 NOTE — Progress Notes (Signed)
Subjective:  Seen on dialysis, passed a few stones yesterday, cloudy urine, but no noticeable blood, pain controlled, NPO for cytoscopy this afternoon.  Objective: Vital signs in last 24 hours: Temp:  [98 F (36.7 C)-100.1 F (37.8 C)] 99 F (37.2 C) (12/18 0407) Pulse Rate:  [72-84] 72  (12/18 0407) Resp:  [18] 18  (12/18 0407) BP: (123-140)/(59-69) 123/62 mmHg (12/18 0407) SpO2:  [94 %-100 %] 95 % (12/18 0407) Weight:  [92 kg (202 lb 13.2 oz)] 92 kg (202 lb 13.2 oz) (12/17 2004) Weight change: 1.1 kg (2 lb 6.8 oz)  Intake/Output from previous day: 12/17 0701 - 12/18 0700 In: 50 [IV Piggyback:50] Out: -    EXAM: General appearance:  Alert, comfortable, in no apparent distress Resp:  CTA without rales, rhonchi, or wheezes Cardio:  RRR without murmur or rub GI:  + BS, soft and nontender Extremities:  No edema Access:  AVF @ RUA with + bruit  Lab Results:  Basename 10/02/12 0540 10/01/12 1405  WBC 20.5* 11.7*  HGB 11.2* 11.4*  HCT 35.0* 35.5*  PLT 222 213   BMET:  Basename 10/02/12 0540 10/01/12 1405  NA 141 138  K 4.4 4.0  CL 97 94*  CO2 30 28  GLUCOSE 135* 221*  BUN 29* 18  CREATININE 9.46* 7.00*  CALCIUM 9.3 9.4  ALBUMIN -- 3.9   No results found for this basename: PTH:2 in the last 72 hours Iron Studies: No results found for this basename: IRON,TIBC,TRANSFERRIN,FERRITIN in the last 72 hours  Dialysis Orders: Center: [place] on MWF.  EDW 92 kg HD Bath 2K/2.25Ca Time 4 hrs Heparin 8000 U. Access AVF @ RUA BFR 450 DFR 800  Zemplar 6 mcg IV/HD Epogen 0 Units IV/HD Venofer 0  Assessment/Plan: 1. Renal stones/UTI - Recent recurrent UTIs; R ureter stone per CT; on IV Zosyn; for repeat CT in case pt passed stone(s) last night. If needed, for urology procedure today per urology 2. ESRD - HD on MWF @ [place]; K 4.4. 3. Hypertension/volume - BP 151/81 on Amlodipine 5 mg qhs, Doxazosin 8 mg qd, and Metoprolol 50 mg bid (hold pre-HD); current wt 90.5 kg with EDW 92, but  NPO.  UF goal of 2 L. 4. Anemia - Hgb 11.2, no outpatient Epogen or Fe. 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 6. Nutrition - Alb 3.9. 7. Hx BPH   LOS: 2 days   [name],[name] 10/03/2012,6:53 AM  Patient seen and examined and agree with assessment and plan as above.  Kelly Splinter  MD [facility] [phone] pgr    [phone] cell 10/03/2012, 10:06 AM

## 2012-10-03 NOTE — OR Nursing (Signed)
Documentation error. Wrong patient chart. eorros which can be corrected in operative chart were corrected.

## 2012-10-03 NOTE — Procedures (Signed)
I was present at this dialysis session. I have reviewed the session itself and made appropriate changes.   Kelly Splinter, MD [employer] 10/03/2012, 9:44 AM

## 2012-10-03 NOTE — Progress Notes (Signed)
GU  Patient had repeat CT today the reveals his hydronephrosis has improved and the previously seen right distal ureter stones have passed.  He states his right flank discomfort has continued to improve throughout the day today.  He does not wish to proceed with surgery at this time, and I agree.  Treat UTI according to cultures. OK from GU point of view to discharge home once tolerating PO antibiotics and cultures are final.  He has f/u scheduled with me on 10/11/12 at 12:30 for cystoscopy.   Please call with any questions.

## 2012-10-04 LAB — GLUCOSE, CAPILLARY
Glucose-Capillary: 144 mg/dL — ABNORMAL HIGH (ref 70–99)
Glucose-Capillary: 133 mg/dL — ABNORMAL HIGH (ref 70–99)

## 2012-10-04 MED ORDER — SULFAMETHOXAZOLE-TMP DS 800-160 MG PO TABS
1.0000 | ORAL_TABLET | Freq: Every day | ORAL | Status: DC
  Administered 2012-10-04: 1 via ORAL
  Filled 2012-10-04: qty 1

## 2012-10-04 MED ORDER — CEFUROXIME AXETIL 500 MG PO TABS: 500.0000 mg | ORAL_TABLET | Freq: Every day | ORAL | Status: DC

## 2012-10-04 MED ORDER — SULFAMETHOXAZOLE-TMP DS 800-160 MG PO TABS: 1.0000 | ORAL_TABLET | Freq: Every day | ORAL | Status: DC

## 2012-10-04 MED ORDER — INSULIN ASPART 100 UNIT/ML ~~LOC~~ SOLN
0.0000 [IU] | Freq: Three times a day (TID) | SUBCUTANEOUS | Status: DC
  Administered 2012-10-04: 1 [IU] via SUBCUTANEOUS

## 2012-10-04 NOTE — Progress Notes (Signed)
Pt tolerating PO food and fluids. Requested MD change cbg schedule to ACHS vs Q4H as sugars have been stable. Will continue to monitor.

## 2012-10-04 NOTE — Discharge Summary (Addendum)
[place], is a 66 y.o. male  DOB 1946-01-16  MRN [id number].  Admission date:  10/01/2012  Discharge Date:  10/04/2012  Primary MD  Kevan Ny, MD  Admitting Physician  Rise Patience, MD  Admission Diagnosis  UTI (urinary tract infection) [599.0] ESRD (end stage renal disease) on dialysis [585.6] Hydronephrosis of right kidney [591] Renal stones 0000000 Complicated UTI (urinary tract infection) [599.0] vomting RIGHT URETER STONE  Discharge Diagnosis     Active Problems:  Complicated UTI (urinary tract infection)  Hydronephrosis of right kidney  ESRD (end stage renal disease) on dialysis  HTN (hypertension)  Diabetes mellitus  Anemia    Past Medical History  Diagnosis Date  . Type II or unspecified type diabetes mellitus without mention of complication, not stated as uncontrolled   . End stage renal disease   . Essential hypertension, malignant   . Hypertrophy of prostate without urinary obstruction and other lower urinary tract symptoms (LUTS)   . Pure hypercholesterolemia   . Renal insufficiency   . Arthritis     Past Surgical History  Procedure Date  . Back surgery   . Cervical discectomy 03/1999  . Dialysis fistula creation 2010  . Eye surgery 2012    cataracts       Discharge Diagnoses:   Active Problems:  Complicated UTI (urinary tract infection)  Hydronephrosis of right kidney  ESRD (end stage renal disease) on dialysis  HTN (hypertension)  Diabetes mellitus  Anemia    Discharge Condition: Stable    Diet recommendation: See Discharge Instructions below    Consults -  ID (campbell on phone), Urology, Renal    History of present illness and  Hospital Course:  See H&P, Labs, Consult and Test reports for all details in brief, patient was admitted for pyelonephritis/UTI caused by right ureteric stone with  obstruction and hydronephrosis, status post spontaneous passage of the stone and clinical improvement in his condition, patient was seen by urology, had repeat CT scan which showed passage of obstructing stone, I discussed his case based on his urine cultures with ID physician Dr. Megan Salon who recommended double strength Bactrim or Ceftin (D/W pharm) once a day for another 12 days with close outpatient followup with his PCP and urology. Patient today is completely symptom free, he is afebrile and feels much better and eager to go home. I have requested him to follow with his PCP and neurologist closely.    ESRD on hemodialysis - Monday Wednesday Friday - was dialyzed here yesterday, will be discharged on renal diet with close outpatient followup with his nephrologist and dialysis unit as before.    For his hypertension and diabetes mellitus type 2 home medications will be continued without any changes.    He also has anemia of chronic disease he will be following with his PCP and nephrologist for it no acute issues.    Today   Subjective:   [name] today has no headache,no chest abdominal pain,no new weakness tingling or numbness,  feels much better wants to go home today.    Objective:   Blood pressure 126/64, pulse 74, temperature 98.5 F (36.9 C), temperature source Oral, resp. rate 18, height 6' (1.829 m), weight 91.1 kg (200 lb 13.4 oz), SpO2 96.00%.   Intake/Output Summary (Last 24 hours) at 10/04/12 0755 Last data filed at 10/04/12 0200  Gross per 24 hour  Intake    353 ml  Output   2000 ml  Net  -1647 ml    Exam Awake Alert, Oriented *3, No new F.N deficits, Normal affect [place].AT,PERRAL Supple Neck,No JVD, No cervical lymphadenopathy appriciated.  Symmetrical Chest wall movement, Good air movement bilaterally, CTAB RRR,No Gallops,Rubs or new Murmurs, No Parasternal Heave +ve B.Sounds, Abd Soft, Non tender, No organomegaly appriciated, No rebound -guarding or  rigidity. No Cyanosis, Clubbing or edema, No new Rash or bruise  Data Review      Ct Abdomen Pelvis Wo Contrast  10/03/2012  *RADIOLOGY REPORT*  Clinical Data: Right ureteral stones.  Recent passage of stone material.  Evaluate for residual stones.  CT ABDOMEN AND PELVIS WITHOUT CONTRAST  Technique:  Multidetector CT imaging of the abdomen and pelvis was performed following the standard protocol without intravenous contrast.  Comparison: 10/01/2012 and 08/27/2012.  Findings: Lung bases show new tiny bilateral pleural effusions and dependent atelectasis.  Patchy ground-glass air space disease in the lingula is incompletely imaged and appears unchanged from 10/01/2012 but is new from 08/27/2012.  Heart is at the upper limits of normal in size.  No pericardial effusion.  Pre pericardiac lymph nodes are small in size.  Liver is unremarkable.  High attenuation material the gallbladder is indicative of vicarious excretion of contrast.  Adrenal glands are unremarkable.  There are several low attenuation lesions in the kidneys, measuring up to 2.6 cm on the right.  Stones are seen bilaterally, with some residual contrast in the renal collecting systems.  Right ureter is less dilated than on the prior study, with some residual peri ureteric stranding.  No definite residual ureteric calculi.  Bladder is decompressed and contains residual contrast.  Spleen and pancreas are unremarkable.  High attenuation in the lower common bile duct is seen dependently and likely represents vicarious excretion of contrast rather than stone debris.  Stomach and bowel are unremarkable.  Small bilateral inguinal hernias contain fat.  A subcutaneous soft tissue nodule overlying the left rectus abdominus muscle measures 1.4 cm.  No pathologically enlarged lymph nodes.  No free fluid. Atherosclerotic calcification of the arterial vasculature without abdominal aortic aneurysm.  No worrisome lytic or sclerotic lesions.  IMPRESSION:  1.   Interval improvement in the right hydronephrosis and associated inflammatory stranding with interval passage of previously seen distal right ureteral stones. 2.  Bilateral renal stones. 3.  New tiny bilateral pleural effusions and dependent atelectasis in both lower lobes. Lingular air space disease is unchanged from 10/01/2012 and new from 08/27/2012, favoring an infectious or inflammatory etiology. 4.  Subcutaneous soft tissue nodule overlying the left rectus abdominus muscle, stable. 5.  Small bilateral inguinal hernias contain fat.   Original Report Authenticated By: Lorin Picket, M.D.    Ct Abdomen Pelvis W Contrast  10/01/2012  *RADIOLOGY REPORT*  Clinical Data: Abdomen and back pain.  Vomiting.  CT ABDOMEN AND PELVIS WITH CONTRAST  Technique:  Multidetector CT imaging of the abdomen and pelvis was performed following the standard protocol during bolus administration of intravenous contrast.  Contrast: 160mL OMNIPAQUE IOHEXOL 300 MG/ML  SOLN  Comparison: 08/27/2012.  Findings: Small sliding hiatal hernia.  Multiple colonic diverticula without evidence of diverticulitis.  Bilateral renal calcifications and parapelvic and cortical cysts are unchanged. Essentially empty urinary bladder.  Interval multiple tiny calculi in the distal right ureter with interval mild dilatation of the right ureter and renal collecting system. Interval right perinephric soft tissue stranding.  Bilateral inguinal hernias containing fat are again demonstrated. No enlarged lymph nodes.  Mildly enlarged prostate gland.  Stable left anterior subcutaneous mass.  Unremarkable liver, spleen, pancreas, gallbladder and adrenal glands.  Minimal dependent atelectasis at the lung bases.  Lumbar and lower thoracic spine degenerative changes.  IMPRESSION:  1.  Interval multiple tiny calculi in the distal right ureter with interval mild right hydronephrosis and hydroureter. 2.  Otherwise, stable bilateral renal calculi and cysts. 3.  Stable  bilateral inguinal hernias containing fat. 4.  Stable left anterior abdominal subcutaneous mass, possibly representing a sebaceous cyst. 5.  Small sliding hiatal hernia. 6.  Colonic diverticulosis.   Original Report Authenticated By: Claudie Revering, M.D.     Micro Results      Recent Results (from the past 240 hour(s))  URINE CULTURE     Status: Normal   Collection Time   10/01/12  5:31 PM      Component Value Range Status Comment   Specimen Description URINE, CLEAN CATCH   Final    Special Requests NONE   Final    Culture  Setup Time 10/01/2012 18:23   Final    Colony Count >=100,000 COLONIES/ML   Final    Culture ESCHERICHIA COLI   Final    Report Status 10/03/2012 FINAL   Final    Organism ID, Bacteria ESCHERICHIA COLI   Final      CBC w Diff: Lab Results  Component Value Date   WBC 14.0* 10/03/2012   HGB 10.6* 10/03/2012   HCT 32.8* 10/03/2012   PLT 207 10/03/2012   LYMPHOPCT 8* 10/01/2012   MONOPCT 8 10/01/2012   EOSPCT 0 10/01/2012   BASOPCT 0 10/01/2012    CMP: Lab Results  Component Value Date   NA 137 10/03/2012   K 4.5 10/03/2012   CL 92* 10/03/2012   CO2 29 10/03/2012   BUN 46* 10/03/2012   CREATININE 13.11* 10/03/2012   PROT 8.2 10/01/2012   ALBUMIN 2.9* 10/03/2012   BILITOT 0.6 10/01/2012   ALKPHOS 91 10/01/2012   AST 14 10/01/2012   ALT 8 10/01/2012  .   Discharge Instructions     Follow with Primary MD Marlou Sa, ERIC, MD in 3 days   Get CBC, CMP, checked 3 days by Primary MD and again as instructed by your Primary MD.    Get Medicines reviewed and adjusted.  Please request your Prim.MD to go over all Hospital Tests and Procedure/Radiological results at the follow up, please get all Hospital records sent to your Prim MD by signing hospital release before you go home.  Activity: As tolerated with Full fall precautions use walker/cane & assistance as needed   Diet:  Renal,  Fluid restriction 1.5 lit/day, Aspiration precautions.  For Heart failure  patients - Check your Weight same time everyday, if you gain over 2 pounds, or you develop in leg swelling, experience more shortness of breath or chest pain, call your Primary MD immediately. Follow Cardiac Low Salt Diet and 1.5 lit/day fluid restriction.  Disposition Home   If you experience worsening of your admission symptoms, develop shortness of breath, life threatening emergency, suicidal or homicidal thoughts you  must seek medical attention immediately by calling 911 or calling your MD immediately  if symptoms less severe.  You Must read complete instructions/literature along with all the possible adverse reactions/side effects for all the Medicines you take and that have been prescribed to you. Take any new Medicines after you have completely understood and accpet all the possible adverse reactions/side effects.   Do not drive and provide baby sitting services if your were admitted for syncope or siezures until you have seen by Primary MD or a Neurologist and advised to do so again.  Do not drive when taking Pain medications.    Do not take more than prescribed Pain, Sleep and Anxiety Medications  Special Instructions: If you have smoked or chewed Tobacco  in the last 2 yrs please stop smoking, stop any regular Alcohol  and or any Recreational drug use.  Wear Seat belts while driving.   Follow-up Information    Follow up with Molli Hazard, MD. On 10/11/2012. (12:30 )    Contact information:   [place], [place] [address] [phone]       Follow up with Marlou Sa, ERIC, MD. Schedule an appointment as soon as possible for a visit in 3 days.   Contact information:   [address] [place] [address] [phone]            Discharge Medications     Medication List     As of 10/04/2012  7:55 AM    START taking these medications         cefUROXime 500 MG tablet   Commonly known as: CEFTIN   Take 1  tablet (500 mg total) by mouth daily.      CONTINUE taking these medications         allopurinol 300 MG tablet   Commonly known as: ZYLOPRIM      amLODipine 5 MG tablet   Commonly known as: NORVASC      cinacalcet 90 MG tablet   Commonly known as: SENSIPAR      doxazosin 8 MG tablet   Commonly known as: CARDURA      glimepiride 4 MG tablet   Commonly known as: AMARYL      lanthanum 1000 MG chewable tablet   Commonly known as: FOSRENOL      latanoprost 0.005 % ophthalmic solution   Commonly known as: XALATAN      linagliptin 5 MG Tabs tablet   Commonly known as: TRADJENTA      metoprolol succinate 50 MG 24 hr tablet   Commonly known as: TOPROL-XL      RENVELA 800 MG tablet   Generic drug: sevelamer          Where to get your medications    These are the prescriptions that you need to pick up. We sent them to a specific pharmacy, so you will need to go there to get them.   [id number] [address], [place] [address]    PhoneSE:[phone]        cefUROXime 500 MG tablet               Total Time in preparing paper work, data evaluation and todays exam - 35 minutes  Thurnell Lose M.D on 10/04/2012 at 7:55 AM  [facility] Office  [phone]

## 2012-10-04 NOTE — Progress Notes (Signed)
Subjective:  Right flank pain improved, no current complaints; surgery cancelled after passing stones yesterday.  Objective: Vital signs in last 24 hours: Temp:  [98.2 F (36.8 C)-99.2 F (37.3 C)] 98.5 F (36.9 C) (12/19 0406) Pulse Rate:  [65-86] 74  (12/19 0406) Resp:  [13-21] 18  (12/19 0406) BP: (112-144)/(58-86) 126/64 mmHg (12/19 0406) SpO2:  [96 %-100 %] 96 % (12/19 0406) Weight:  [88.4 kg (194 lb 14.2 oz)-91.1 kg (200 lb 13.4 oz)] 91.1 kg (200 lb 13.4 oz) (12/18 2001) Weight change: -3.6 kg (-7 lb 15 oz)  Intake/Output from previous day: 12/18 0701 - 12/19 0700 In: 353 [P.O.:200; I.V.:3; IV Piggyback:150] Out: 2000    EXAM: General appearance:  Alert, comfortable, in no apparent distress Resp:  CTA without rales, rhonchi, or wheezes Cardio:  RRR without murmur or rub GI:  + BS, soft and nontender Extremities:  No edema Access:  AVF @ RUA with + bruit  Lab Results:  Basename 10/03/12 0712 10/02/12 0540  WBC 14.0* 20.5*  HGB 10.6* 11.2*  HCT 32.8* 35.0*  PLT 207 222   BMET:  Basename 10/03/12 0712 10/02/12 0540 10/01/12 1405  NA 137 141 --  K 4.5 4.4 --  CL 92* 97 --  CO2 29 30 --  GLUCOSE 98 135* --  BUN 46* 29* --  CREATININE 13.11* 9.46* --  CALCIUM 10.4 9.3 --  ALBUMIN 2.9* -- 3.9   No results found for this basename: PTH:2 in the last 72 hours Iron Studies: No results found for this basename: IRON,TIBC,TRANSFERRIN,FERRITIN in the last 72 hours  Dialysis Orders: Center: [facility] on MWF.  EDW 92 kg HD Bath 2K/2.25Ca Time 4 hrs Heparin 8000 U. Access AVF @ RUA BFR 450 DFR 800   Zemplar 6 mcg IV/HD Epogen 0 Units IV/HD Venofer 0  Assessment/Plan: 1. Renal stones/UTI - Recent recurrent UTIs; repeat CT yesterday showed improved hydronephrosis and ureteral stone was gone (still has renal stones); cystoscopy cancelled by Urology; urine culture + for E coli; remains on IV Zosyn, but may discharge when tolerating PO antibiotics.  2. ESRD - HD on MWF @ [facility]; K  4.5. 3. Hypertension/volume - BP 126/64 on Amlodipine 5 mg qhs, Doxazosin 8 mg qd, and Metoprolol 50 mg bid (hold pre-HD); current wt 91.1 kg s/p net UF 2 L yesterday (EDW 92). 4. Anemia - Hgb 10.6, no outpatient Epogen or Fe. 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  Hold Zemplar. 6. Nutrition - Alb 2.9. 7. Hx BPH    LOS: 3 days   LYLES,CHARLES 10/04/2012,7:09 AM  Patient seen and examined and agree with assessment and plan as above.  Kelly Splinter  MD [facility] [phone] pgr    [phone] cell 10/04/2012, 10:17 AM

## 2012-10-04 NOTE — Progress Notes (Signed)
IV was d/c,tele was d/c,discharge instructions were given to pt.pt. Ready to go home.

## 2012-10-15 ENCOUNTER — Other Ambulatory Visit: Payer: Self-pay | Admitting: Urology

## 2012-10-22 ENCOUNTER — Encounter (HOSPITAL_COMMUNITY): Payer: Self-pay | Admitting: Pharmacy Technician

## 2012-10-24 NOTE — Patient Instructions (Signed)
22 [name]  10/24/2012   Your procedure is scheduled on: 11/01/12   Report to [place] [facility] at [place] AM.  Call this number if you have problems the morning of surgery: [phone]   Remember:   Do not eat food:After Midnight.  May have clear liquids:until Midnight .   Take these medicines the morning of surgery with A SIP OF WATER:    Do not wear jewelry,   Do not wear lotions, powders, or perfumes.    . Men may shave face and neck.  Do not bring valuables to the hospital.  Contacts, dentures or bridgework may not be worn into surgery.       Patients discharged the day of surgery will not be allowed to drive home.  Name and phone number of your driver:    SEE CHG INSTRUCTION SHEET    Please read over the following fact sheets that you were given: MRSA Information, coughing and deep breathing exercises, leg exercises               Failure to comply with these instructions may results in cancellation of your surgery.               Patient Signature ________________________             Nurse Signature _________________________

## 2012-10-25 ENCOUNTER — Encounter (HOSPITAL_COMMUNITY)
Admission: RE | Admit: 2012-10-25 | Discharge: 2012-10-25 | Disposition: A | Discharge status: Home / Self Care | Payer: Medicare Other | Source: Ambulatory Visit | Attending: Urology | Admitting: Urology

## 2012-10-25 ENCOUNTER — Ambulatory Visit (HOSPITAL_COMMUNITY)
Admission: RE | Admit: 2012-10-25 | Discharge: 2012-10-25 | Disposition: A | Discharge status: Home / Self Care | Payer: Medicare Other | Source: Ambulatory Visit | Attending: Urology | Admitting: Urology

## 2012-10-25 ENCOUNTER — Encounter (HOSPITAL_COMMUNITY): Payer: Self-pay

## 2012-10-25 DIAGNOSIS — Z01812 Encounter for preprocedural laboratory examination: Secondary | ICD-10-CM | POA: Insufficient documentation

## 2012-10-25 DIAGNOSIS — Z01818 Encounter for other preprocedural examination: Secondary | ICD-10-CM | POA: Insufficient documentation

## 2012-10-25 HISTORY — DX: Anemia, unspecified: D64.9

## 2012-10-25 LAB — SURGICAL PCR SCREEN: MRSA, PCR: NEGATIVE

## 2012-10-25 NOTE — Progress Notes (Signed)
Fistula is located in right arm.

## 2012-10-30 ENCOUNTER — Ambulatory Visit: Payer: Self-pay | Admitting: Vascular Surgery

## 2012-11-01 ENCOUNTER — Encounter (HOSPITAL_COMMUNITY): Payer: Self-pay | Admitting: *Deleted

## 2012-11-01 ENCOUNTER — Encounter (HOSPITAL_COMMUNITY)
Admission: RE | Disposition: A | Discharge status: Home / Self Care | Payer: Self-pay | Source: Ambulatory Visit | Attending: Urology

## 2012-11-01 ENCOUNTER — Ambulatory Visit (HOSPITAL_COMMUNITY): Payer: Medicare Other | Admitting: Anesthesiology

## 2012-11-01 ENCOUNTER — Ambulatory Visit (HOSPITAL_COMMUNITY)
Admission: RE | Admit: 2012-11-01 | Discharge: 2012-11-01 | Disposition: A | Discharge status: Home / Self Care | Payer: Medicare Other | Source: Ambulatory Visit | Attending: Urology | Admitting: Urology

## 2012-11-01 ENCOUNTER — Encounter (HOSPITAL_COMMUNITY): Payer: Self-pay | Admitting: Anesthesiology

## 2012-11-01 DIAGNOSIS — N200 Calculus of kidney: Secondary | ICD-10-CM | POA: Insufficient documentation

## 2012-11-01 DIAGNOSIS — N3289 Other specified disorders of bladder: Secondary | ICD-10-CM | POA: Insufficient documentation

## 2012-11-01 DIAGNOSIS — Z992 Dependence on renal dialysis: Secondary | ICD-10-CM | POA: Insufficient documentation

## 2012-11-01 DIAGNOSIS — E7800 Pure hypercholesterolemia, unspecified: Secondary | ICD-10-CM | POA: Insufficient documentation

## 2012-11-01 DIAGNOSIS — N186 End stage renal disease: Secondary | ICD-10-CM | POA: Insufficient documentation

## 2012-11-01 DIAGNOSIS — I120 Hypertensive chronic kidney disease with stage 5 chronic kidney disease or end stage renal disease: Secondary | ICD-10-CM | POA: Insufficient documentation

## 2012-11-01 HISTORY — PX: CYSTOSCOPY WITH RETROGRADE PYELOGRAM, URETEROSCOPY AND STENT PLACEMENT: SHX5789

## 2012-11-01 HISTORY — PX: HOLMIUM LASER APPLICATION: SHX5852

## 2012-11-01 HISTORY — PX: CYSTOSCOPY WITH BIOPSY: SHX5122

## 2012-11-01 LAB — CBC
HCT: 32 % — ABNORMAL LOW (ref 39.0–52.0)
Hemoglobin: 10.3 g/dL — ABNORMAL LOW (ref 13.0–17.0)
MCH: 30.5 pg (ref 26.0–34.0)
MCHC: 32.2 g/dL (ref 30.0–36.0)
MCV: 94.7 fL (ref 78.0–100.0)
RDW: 16.8 % — ABNORMAL HIGH (ref 11.5–15.5)

## 2012-11-01 LAB — GLUCOSE, CAPILLARY: Glucose-Capillary: 122 mg/dL — ABNORMAL HIGH (ref 70–99)

## 2012-11-01 LAB — BASIC METABOLIC PANEL
BUN: 26 mg/dL — ABNORMAL HIGH (ref 6–23)
CO2: 31 mEq/L (ref 19–32)
Chloride: 94 mEq/L — ABNORMAL LOW (ref 96–112)
Creatinine, Ser: 8.84 mg/dL — ABNORMAL HIGH (ref 0.50–1.35)
GFR calc Af Amer: 6 mL/min — ABNORMAL LOW (ref >90)
Glucose, Bld: 113 mg/dL — ABNORMAL HIGH (ref 70–99)
Potassium: 4 mEq/L (ref 3.5–5.1)

## 2012-11-01 SURGERY — CYSTOURETEROSCOPY, WITH RETROGRADE PYELOGRAM AND STENT INSERTION
Anesthesia: General | Wound class: Clean Contaminated

## 2012-11-01 MED ORDER — PROPOFOL 10 MG/ML IV EMUL
INTRAVENOUS | Status: DC | PRN
  Administered 2012-11-01: 20 mg via INTRAVENOUS
  Administered 2012-11-01: 200 mg via INTRAVENOUS
  Administered 2012-11-01: 20 mg via INTRAVENOUS

## 2012-11-01 MED ORDER — ONDANSETRON HCL 4 MG/2ML IJ SOLN
4.0000 mg | INTRAMUSCULAR | Status: DC | PRN
  Administered 2012-11-01: 4 mg via INTRAVENOUS
  Filled 2012-11-01: qty 2

## 2012-11-01 MED ORDER — IOHEXOL 300 MG/ML  SOLN
INTRAMUSCULAR | Status: AC
  Filled 2012-11-01: qty 1

## 2012-11-01 MED ORDER — PROMETHAZINE HCL 25 MG/ML IJ SOLN: 6.2500 mg | INTRAMUSCULAR | Status: DC | PRN

## 2012-11-01 MED ORDER — OXYBUTYNIN CHLORIDE 5 MG PO TABS: 5.0000 mg | ORAL_TABLET | Freq: Four times a day (QID) | ORAL | Status: DC | PRN

## 2012-11-01 MED ORDER — PHENYLEPHRINE HCL 10 MG/ML IJ SOLN
INTRAMUSCULAR | Status: DC | PRN
  Administered 2012-11-01 (×2): 80 ug via INTRAVENOUS

## 2012-11-01 MED ORDER — OXYCODONE-ACETAMINOPHEN 5-325 MG PO TABS
1.0000 | ORAL_TABLET | Freq: Once | ORAL | Status: AC
  Administered 2012-11-01: 1 via ORAL
  Filled 2012-11-01: qty 1

## 2012-11-01 MED ORDER — HYOSCYAMINE SULFATE 0.125 MG SL SUBL
0.1250 mg | SUBLINGUAL_TABLET | Freq: Once | SUBLINGUAL | Status: AC
  Administered 2012-11-01: 0.125 mg via SUBLINGUAL
  Filled 2012-11-01: qty 1

## 2012-11-01 MED ORDER — HYOSCYAMINE SULFATE 0.125 MG PO TABS: 0.1250 mg | ORAL_TABLET | ORAL | Status: DC | PRN

## 2012-11-01 MED ORDER — CEFAZOLIN SODIUM-DEXTROSE 2-3 GM-% IV SOLR
INTRAVENOUS | Status: AC
  Filled 2012-11-01: qty 50

## 2012-11-01 MED ORDER — HYOSCYAMINE SULFATE 0.125 MG PO TABS
0.1250 mg | ORAL_TABLET | Freq: Once | ORAL | Status: DC
  Filled 2012-11-01: qty 1

## 2012-11-01 MED ORDER — FENTANYL CITRATE 0.05 MG/ML IJ SOLN: 25.0000 ug | INTRAMUSCULAR | Status: DC | PRN

## 2012-11-01 MED ORDER — OXYCODONE-ACETAMINOPHEN 5-325 MG PO TABS
1.0000 | ORAL_TABLET | ORAL | Status: DC | PRN
Start: 2012-11-01 — End: 2013-03-26

## 2012-11-01 MED ORDER — OXYBUTYNIN CHLORIDE 5 MG PO TABS
5.0000 mg | ORAL_TABLET | Freq: Four times a day (QID) | ORAL | Status: DC | PRN
  Administered 2012-11-01: 5 mg via ORAL
  Filled 2012-11-01: qty 1

## 2012-11-01 MED ORDER — LIDOCAINE HCL 2 % EX GEL
CUTANEOUS | Status: DC | PRN
  Administered 2012-11-01: 1 via URETHRAL

## 2012-11-01 MED ORDER — SODIUM CHLORIDE 0.9 % IV SOLN
INTRAVENOUS | Status: DC
  Administered 2012-11-01: 1000 mL via INTRAVENOUS

## 2012-11-01 MED ORDER — INDIGOTINDISULFONATE SODIUM 8 MG/ML IJ SOLN
INTRAMUSCULAR | Status: AC
  Filled 2012-11-01: qty 5

## 2012-11-01 MED ORDER — SENNOSIDES-DOCUSATE SODIUM 8.6-50 MG PO TABS: 1.0000 | ORAL_TABLET | Freq: Two times a day (BID) | ORAL | Status: DC

## 2012-11-01 MED ORDER — EPHEDRINE SULFATE 50 MG/ML IJ SOLN
INTRAMUSCULAR | Status: DC | PRN
  Administered 2012-11-01: 10 mg via INTRAVENOUS
  Administered 2012-11-01: 5 mg via INTRAVENOUS
  Administered 2012-11-01: 10 mg via INTRAVENOUS

## 2012-11-01 MED ORDER — ONDANSETRON HCL 4 MG/2ML IJ SOLN
INTRAMUSCULAR | Status: DC | PRN
  Administered 2012-11-01: 4 mg via INTRAVENOUS

## 2012-11-01 MED ORDER — SODIUM CHLORIDE 0.9 % IR SOLN
Status: DC | PRN
  Administered 2012-11-01: 8000 mL via INTRAVESICAL

## 2012-11-01 MED ORDER — STERILE WATER FOR IRRIGATION IR SOLN
Status: DC | PRN
  Administered 2012-11-01: 1000 mL

## 2012-11-01 MED ORDER — BELLADONNA ALKALOIDS-OPIUM 16.2-60 MG RE SUPP
RECTAL | Status: AC
  Filled 2012-11-01: qty 1

## 2012-11-01 MED ORDER — OXYCODONE-ACETAMINOPHEN 5-325 MG PO TABS
ORAL_TABLET | ORAL | Status: AC
  Administered 2012-11-01: 1
  Filled 2012-11-01: qty 1

## 2012-11-01 MED ORDER — ONDANSETRON HCL 4 MG PO TABS: 4.0000 mg | ORAL_TABLET | Freq: Three times a day (TID) | ORAL | Status: DC | PRN

## 2012-11-01 MED ORDER — LIDOCAINE HCL (CARDIAC) 20 MG/ML IV SOLN
INTRAVENOUS | Status: DC | PRN
  Administered 2012-11-01: 100 mg via INTRAVENOUS

## 2012-11-01 MED ORDER — IOHEXOL 350 MG/ML SOLN
INTRAVENOUS | Status: DC | PRN
  Administered 2012-11-01: 50 mL

## 2012-11-01 MED ORDER — FENTANYL CITRATE 0.05 MG/ML IJ SOLN
INTRAMUSCULAR | Status: DC | PRN
  Administered 2012-11-01 (×2): 25 ug via INTRAVENOUS
  Administered 2012-11-01: 50 ug via INTRAVENOUS

## 2012-11-01 MED ORDER — CEFAZOLIN SODIUM-DEXTROSE 2-3 GM-% IV SOLR
2.0000 g | INTRAVENOUS | Status: AC
  Administered 2012-11-01: 2 g via INTRAVENOUS

## 2012-11-01 MED ORDER — OXYCHLOROSENE SODIUM POWD
Status: AC
  Filled 2012-11-01: qty 2

## 2012-11-01 MED ORDER — MIDAZOLAM HCL 5 MG/5ML IJ SOLN
INTRAMUSCULAR | Status: DC | PRN
  Administered 2012-11-01: 1 mg via INTRAVENOUS

## 2012-11-01 MED ORDER — KETOROLAC TROMETHAMINE 30 MG/ML IJ SOLN: 15.0000 mg | Freq: Once | INTRAMUSCULAR | Status: DC | PRN

## 2012-11-01 MED ORDER — BELLADONNA ALKALOIDS-OPIUM 16.2-60 MG RE SUPP
RECTAL | Status: DC | PRN
  Administered 2012-11-01: 1 via RECTAL

## 2012-11-01 MED ORDER — LIDOCAINE HCL 2 % EX GEL
CUTANEOUS | Status: AC
  Filled 2012-11-01: qty 10

## 2012-11-01 SURGICAL SUPPLY — 45 items
BAG URO CATCHER STRL LF (DRAPE) ×3 IMPLANT
BASKET LASER NITINOL 1.9FR (BASKET) IMPLANT
BASKET STNLS GEMINI 4WIRE 3FR (BASKET) IMPLANT
BASKET ZERO TIP NITINOL 2.4FR (BASKET) ×3 IMPLANT
BRUSH URET BIOPSY 3F (UROLOGICAL SUPPLIES) IMPLANT
CATH CLEAR GEL 3F BACKSTOP (CATHETERS) IMPLANT
CATH ROBINSON RED A/P 16FR (CATHETERS) IMPLANT
CATH URET 5FR 28IN CONE TIP (BALLOONS)
CATH URET 5FR 28IN OPEN ENDED (CATHETERS) ×3 IMPLANT
CATH URET 5FR 70CM CONE TIP (BALLOONS) IMPLANT
CATH URET DUAL LUMEN 6-10FR 50 (CATHETERS) IMPLANT
CLOTH BEACON ORANGE TIMEOUT ST (SAFETY) ×3 IMPLANT
DRAPE CAMERA CLOSED 9X96 (DRAPES) ×3 IMPLANT
ELECT REM PT RETURN 9FT ADLT (ELECTROSURGICAL) ×3
ELECTRODE REM PT RTRN 9FT ADLT (ELECTROSURGICAL) ×2 IMPLANT
FIBER LASER TRAC TIP (UROLOGICAL SUPPLIES) ×3 IMPLANT
GLOVE BIOGEL M 7.0 STRL (GLOVE) ×3 IMPLANT
GLOVE ECLIPSE 7.0 STRL STRAW (GLOVE) ×3 IMPLANT
GLOVE SURG SS PI 8.0 STRL IVOR (GLOVE) ×3 IMPLANT
GOWN PREVENTION PLUS LG XLONG (DISPOSABLE) ×3 IMPLANT
GOWN PREVENTION PLUS XLARGE (GOWN DISPOSABLE) ×3 IMPLANT
GOWN STRL REIN XL XLG (GOWN DISPOSABLE) ×3 IMPLANT
GUIDEWIRE ANG ZIPWIRE 038X150 (WIRE) IMPLANT
GUIDEWIRE STR DUAL SENSOR (WIRE) ×6 IMPLANT
IV NS IRRIG 3000ML ARTHROMATIC (IV SOLUTION) IMPLANT
KIT BALLIN UROMAX 15FX10 (LABEL) IMPLANT
KIT BALLN UROMAX 15FX4 (MISCELLANEOUS) IMPLANT
KIT BALLN UROMAX 26 75X4 (MISCELLANEOUS)
LASER FIBER DISP (UROLOGICAL SUPPLIES) IMPLANT
MANIFOLD NEPTUNE II (INSTRUMENTS) ×3 IMPLANT
MARKER SKIN DUAL TIP RULER LAB (MISCELLANEOUS) ×3 IMPLANT
NDL SAFETY ECLIPSE 18X1.5 (NEEDLE) IMPLANT
NEEDLE HYPO 18GX1.5 SHARP (NEEDLE)
NEEDLE HYPO 22GX1.5 SAFETY (NEEDLE) IMPLANT
PACK CYSTO (CUSTOM PROCEDURE TRAY) ×3 IMPLANT
SCRUB PCMX 4 OZ (MISCELLANEOUS) ×3 IMPLANT
SET HIGH PRES BAL DIL (LABEL)
SHEATH ACCESS URETERAL 38CM (SHEATH) ×3 IMPLANT
SHEATH ACCESS URETERAL 54CM (SHEATH) ×3 IMPLANT
SHEATH URET ACCESS 12FR/35CM (UROLOGICAL SUPPLIES) IMPLANT
SHEATH URET ACCESS 12FR/55CM (UROLOGICAL SUPPLIES) IMPLANT
STENT CONTOUR 6FRX26X.038 (STENTS) ×6 IMPLANT
SYRINGE IRR TOOMEY STRL 70CC (SYRINGE) ×3 IMPLANT
TUBING CONNECTING 10 (TUBING) ×3 IMPLANT
WATER STERILE IRR 3000ML UROMA (IV SOLUTION) IMPLANT

## 2012-11-01 NOTE — Op Note (Signed)
Urology Operative Report  Date of Procedure: 11/01/12  Surgeon: Rolan Bucco, MD Assistant: None  Preoperative Diagnosis: Bilateral nephrolithiasis. Bladder lesion Postoperative Diagnosis:  Same  Procedure(s): Cystoscopy Bladder biopsy Bilateral ureteroscopy Bilateral basket stone-retrieval Bilateral ureter stent placement Fluoroscopy with interpretation less than one hour  Estimated blood loss: Minimal  Specimen: Bladder biopsy sent to pathology. Stones sent for chemical analysis.  Drains: None  Complications: None  Findings: Erythema in the bladder which was biopsied. Multiple bilateral small stones which were likely struvite in nature and were too numerous to remove due to the small size and consistency.  History of present illness: 67 year old male with end-stage renal failure on dialysis presents for bilateral nephrolithiasis. He has had several urinary tract infections recently. He is also had some erythema in his bladder on office cystoscopy. He presents today for biopsy of the bladder and attempted removal of the stones.   Procedure in detail: After informed consent was obtained, the patient was taken to the operating room. They were placed in the supine position. SCDs were turned on and in place. IV antibiotics were infused, and general anesthesia was induced. A timeout was performed in which the correct patient, surgical site, and procedure were identified and agreed upon by the team.  The patient was placed in a dorsolithotomy position, making sure to pad all pertinent neurovascular pressure points. The genitals were prepped and draped in the usual sterile fashion.   Rigid cystoscope was advanced through the urethra and into the bladder. The bladder was distended and evaluated in a systematic fashion with a 70 and 30 lens. Flat areas of erythema were noted on the posterior bladder wall and left lateral bladder wall both of which were biopsied with cold course of as  well as an additional I see taken of the right lateral bladder wall. Fulguration of biopsy sites with Bugbee electrode was completed.  Right ureter was cannulated with a sensor tip wire. A second wire was placed as well. Ureter access sheath was placed with ease up into the right renal pelvis. Flexible digital scope was taken up into the right renal pelvis and the entire kidney was explored. There were no lesions. There were numerous small stones which were almost too small to basket retrieve. Any stones which were larger were grasped but immediately disintegrated and looked like struvite stones. I was able to remove a few stones but I was not able to sweep out a majority of the stones. The access sheath was removed and the entire ureter was visualized. There was no lesion in the ureter and no injury to the ureter.   Attention was turned to the left ureter orifice which was cannulated with 2 sensor-tip wires. The ureter access sheath was placed up one of the wires into the left renal pelvis and obturator and working wire were removed. This kidney was explored in its entirety and there were no concerning lesions. This also had several areas of calyceal stenosis and a large amount of small stones. Once again the stones were attempted to be removed but they disintegrated when grasped with a 0 tip Nitinol basket. 200  holmium laser fiber was placed and lithotripsy was carried out to try to disintegrate the stones into smaller pieces and hopefully they would wash out on her own. After this was complete the ureter access sheath was removed along with the ureter scope and the entire ureter was visualized. There were no lesions or injury to the ureter.   Next a 6 x  26 double-J stent was placed up the left ureter over the safety wire and the string was left in place. A good curl noted in the left renal pelvis and in the bladder on direct visualization. A sensor tip wire space at the right ureter orifice and a 6 x 26  double-J ureter stent was placed over the wire with a good curl noted in the right renal pelvis and under direct visualization in the bladder. The string was left in place on this as well. The bladder was drained, 10 cc of lidocaine jelly were placed into the urethra, and a belladonna and opium suppository was placed into the rectum. Rectal exam revealed no nodules of the prostate. Anesthesia was reversed and he was placed back in a supine position. He was taken to the PACU in stable condition.  He will continue his Ceftin which she is taking. He will remove his stents on Sunday, 11/04/12.

## 2012-11-01 NOTE — Progress Notes (Signed)
Notified DR Jasmine December of pts status.

## 2012-11-01 NOTE — Anesthesia Preprocedure Evaluation (Signed)
Anesthesia Evaluation  Patient identified by MRN, date of birth, ID band Patient awake    Reviewed: Allergy & Precautions, H&P , NPO status , Patient's Chart, lab work & pertinent test results  Airway Mallampati: II TM Distance: >3 FB Neck ROM: Full    Dental No notable dental hx.    Pulmonary neg pulmonary ROS,  breath sounds clear to auscultation  Pulmonary exam normal       Cardiovascular hypertension, Pt. on medications Rhythm:Regular Rate:Normal     Neuro/Psych negative neurological ROS  negative psych ROS   GI/Hepatic negative GI ROS, Neg liver ROS,   Endo/Other  negative endocrine ROSdiabetes  Renal/GU DialysisRenal disease  negative genitourinary   Musculoskeletal negative musculoskeletal ROS (+)   Abdominal   Peds negative pediatric ROS (+)  Hematology negative hematology ROS (+)   Anesthesia Other Findings   Reproductive/Obstetrics negative OB ROS                           Anesthesia Physical Anesthesia Plan  ASA: III  Anesthesia Plan: General   Post-op Pain Management:    Induction: Intravenous  Airway Management Planned: LMA  Additional Equipment:   Intra-op Plan:   Post-operative Plan:   Informed Consent: I have reviewed the patients History and Physical, chart, labs and discussed the procedure including the risks, benefits and alternatives for the proposed anesthesia with the patient or authorized representative who has indicated his/her understanding and acceptance.   Dental advisory given  Plan Discussed with: CRNA and Surgeon  Anesthesia Plan Comments:         Anesthesia Quick Evaluation

## 2012-11-01 NOTE — Preoperative (Signed)
Beta Blockers   Reason not to administer Beta Blockers:Not Applicable Pt took Beta Blocker 11-01-12

## 2012-11-01 NOTE — H&P (Signed)
Urology History and Physical Exam  CC: bilateral nephrolithiasis. Bladder lesion.  HPI: 67 year old male with a history of end-stage renal disease on chronic hemodialysis presents today for a bladder lesion and nephrolithiasis. His bladder lesion was discovered on workup for gross hematuria. This is a small amount of erythema in the posterior midline of the bladder. It is not associated with papillary tumor. There are no aggravating or alleviating factors.  He also has bilateral nephrolithiasis. He had a CT scan without contrast on 10/01/12 which showed a right distal ureter stone which he was able to pass. His stone burden on the right reveals an upper pole stone of 3 mm, lower pole stone of 9 and 5 mm; the left side has lower pole stones of 6 mm and 6 mm, left middle pole with 5 mm, and left upper pole with stones of 7 mm and 8 mm. This has been associated with urinary tract infection. There are no aggravating or alleviating factors. He has not had any recent flank pain with his stones other than when he passed a right-sided stone.  We discussed management options for his bladder lesion and his urinary stones.we discussed options including bladder biopsy versus observation. We discussed the risk, benefits, alternatives, and likelihood of achieving goalswith bladder biopsy which include, but are not limited to gross hematuria, bladder perforation, need for further procedure. We also discussed options for stone management including observation, shockwave lithotripsy, and ureteroscopy as well as PCNL. He elected for ureteroscopy. We discussed the risks, benefits, which include but are not limited to heart attack, stroke, death, failure to clear stone burden, need for repeat procedure, ureter injury with ureter stricture, ureter avulsion, ureter laceration, need for open surgical repair of urinary system.  Urine culture from 10/11/12 was negative for infection.  PMH: Past Medical History  Diagnosis Date   . Type II or unspecified type diabetes mellitus without mention of complication, not stated as uncontrolled   . Essential hypertension, malignant   . Hypertrophy of prostate without urinary obstruction and other lower urinary tract symptoms (LUTS)   . Pure hypercholesterolemia   . End stage renal disease     [place]   . Renal insufficiency   . Arthritis     right hip   . Anemia     hx of at beginnning of dialysis- 05/2009    PSH: Past Surgical History  Procedure Date  . Back surgery   . Cervical discectomy 03/1999  . Dialysis fistula creation 2010  . Eye surgery 2012    cataracts    Allergies: Allergies  Allergen Reactions  . Codeine Nausea And Vomiting  . Doxycycline Nausea And Vomiting    Medications: No prescriptions prior to admission     Social History: History   Social History  . Marital Status: Married    Spouse Name: N/A    Number of Children: N/A  . Years of Education: N/A   Occupational History  . Not on file.   Social History Main Topics  . Smoking status: Former Smoker    Quit date: 10/02/1990  . Smokeless tobacco: Never Used  . Alcohol Use: No  . Drug Use: No  . Sexually Active: Not on file   Other Topics Concern  . Not on file   Social History Narrative  . No narrative on file    Family History: Family History  Problem Relation Age of Onset  . Hypertension Brother   . Diabetes Mellitus  II Brother     Review of Systems: Positive: None Negative: Fever, chest pain, or SOB.  A further 10 point review of systems was negative except what is listed in the HPI.  Physical Exam: Filed Vitals:   11/01/12 1005  BP: 132/67  Pulse: 68  Temp: 98.5 F (36.9 C)  Resp: 18    General: No acute distress.  Awake. Head:  Normocephalic.  Atraumatic. ENT:  EOMI.  Mucous membranes moist Neck:  Supple.  No lymphadenopathy. CV:  S1 present. S2 present. Regular rate. Pulmonary: Equal effort bilaterally.  Clear  to auscultation bilaterally. Abdomen: Soft.  Non- tender to palpation. Skin:  Normal turgor.  No visible rash. Extremity: No gross deformity of bilateral upper extremities.  No gross deformity of    bilateral lower extremities. Neurologic: Alert. Appropriate mood.    Studies:  No results found for this basename: HGB:2,WBC:2,PLT:2 in the last 72 hours  No results found for this basename: NA:2,K:2,CL:2,CO2:2,BUN:2,CREATININE:2,CALCIUM:2,MAGNESIUM:2,GFRNONAA:2,GFRAA:2 in the last 72 hours   No results found for this basename: PT:2,INR:2,APTT:2 in the last 72 hours   No components found with this basename: ABG:2    Assessment:  Bilateral nephrolithiasis. Bladder lesion.  Plan: Procedure in operating room for cystoscopy, bladder biopsy, bilateral retrograde pyelograms, bilateral ureteroscopy with laser lithotripsy, possible bilateral ureter stent placement. He understands that we will start with the right side with ureteroscopy. If this takes an extended amount of time then we will reschedule treatment of his left-sided stones.

## 2012-11-01 NOTE — Progress Notes (Signed)
Dr Jasmine December at pts bedside discussing plan of care.

## 2012-11-01 NOTE — Transfer of Care (Signed)
Immediate Anesthesia Transfer of Care Note  Patient: RAKAN LIMES  Procedure(s) Performed: Procedure(s) (LRB): CYSTOSCOPY WITH RETROGRADE PYELOGRAM, URETEROSCOPY AND STENT PLACEMENT (Bilateral) HOLMIUM LASER APPLICATION (Bilateral) CYSTOSCOPY WITH BIOPSY (N/A)  Patient Location: PACU  Anesthesia Type: General  Level of Consciousness: sedated, patient cooperative and responds to stimulaton  Airway & Oxygen Therapy: Patient Spontanous Breathing and Patient connected to face mask oxgen  Post-op Assessment: Report given to PACU RN and Post -op Vital signs reviewed and stable  Post vital signs: Reviewed and stable  Complications: No apparent anesthesia complications

## 2012-11-01 NOTE — Anesthesia Postprocedure Evaluation (Signed)
  Anesthesia Post-op Note  Patient: [name]  Procedure(s) Performed: Procedure(s) (LRB): CYSTOSCOPY WITH RETROGRADE PYELOGRAM, URETEROSCOPY AND STENT PLACEMENT (Bilateral) HOLMIUM LASER APPLICATION (Bilateral) CYSTOSCOPY WITH BIOPSY (N/A)  Patient Location: PACU  Anesthesia Type: General  Level of Consciousness: awake and alert   Airway and Oxygen Therapy: Patient Spontanous Breathing  Post-op Pain: mild  Post-op Assessment: Post-op Vital signs reviewed, Patient's Cardiovascular Status Stable, Respiratory Function Stable, Patent Airway and No signs of Nausea or vomiting  Last Vitals:  Filed Vitals:   11/01/12 1455  BP:   Pulse: 64  Temp:   Resp: 16    Post-op Vital Signs: stable   Complications: No apparent anesthesia complications

## 2012-11-01 NOTE — Progress Notes (Signed)
Patient last has dialysis 10/31/12 at [facility]

## 2012-11-02 ENCOUNTER — Encounter (HOSPITAL_COMMUNITY): Payer: Self-pay | Admitting: Urology

## 2012-12-14 ENCOUNTER — Encounter: Payer: Self-pay | Admitting: Hematology

## 2013-02-23 IMAGING — CT CT ABD-PELV W/ CM
2 of 5 series · 17 of 46 positions shown, 19 images · IV contrast (APPLIED)
Comparison: 08/27/2012.

CLINICAL DATA: Abdomen and back pain.  Vomiting.

CT ABDOMEN AND PELVIS WITH CONTRAST
TECHNIQUE: Multidetector CT imaging of the abdomen and pelvis was
performed following the standard protocol during bolus
administration of intravenous contrast.
Contrast: 100mL OMNIPAQUE IOHEXOL 300 MG/ML  SOLN

[Series 2: abd/pelv with 5.0 b31f st · axial · 0.75mm/px · z∈[-462,-2]mm · 14 of 104 slices shown, 16 images]
[im 6/104  soft-tissue]
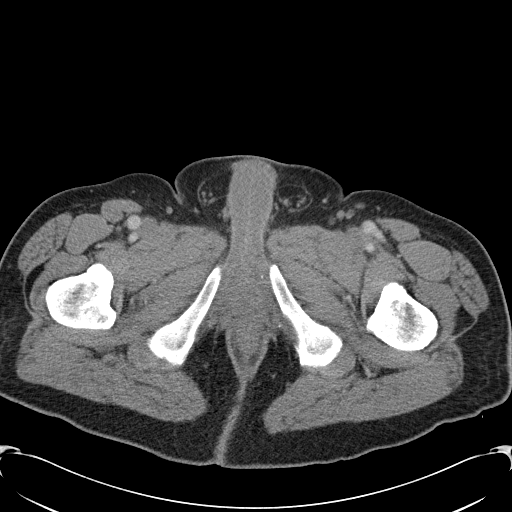
[im 6/104  bone]
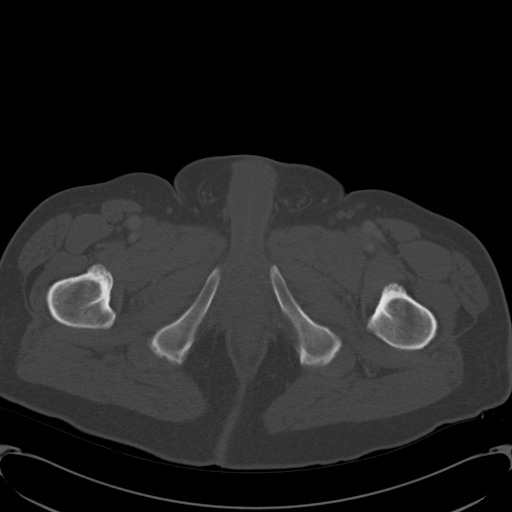
[im 11/104  soft-tissue]
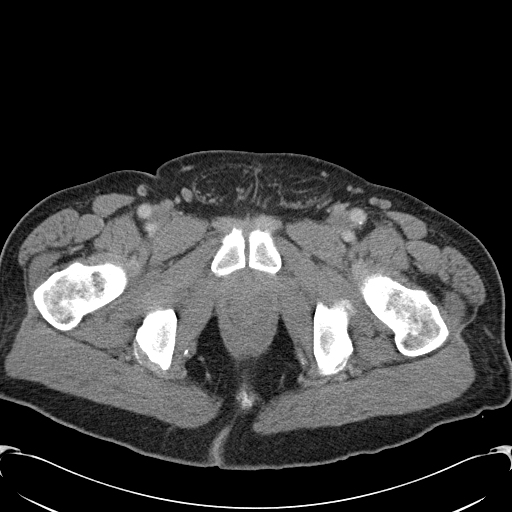
[im 22/104  soft-tissue]
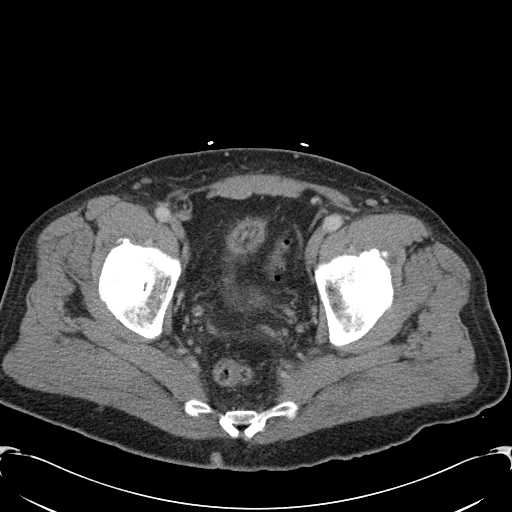
[im 28/104  soft-tissue]
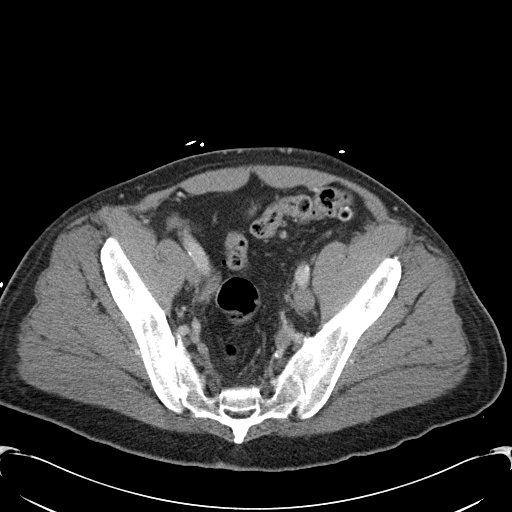
[im 33/104  soft-tissue]
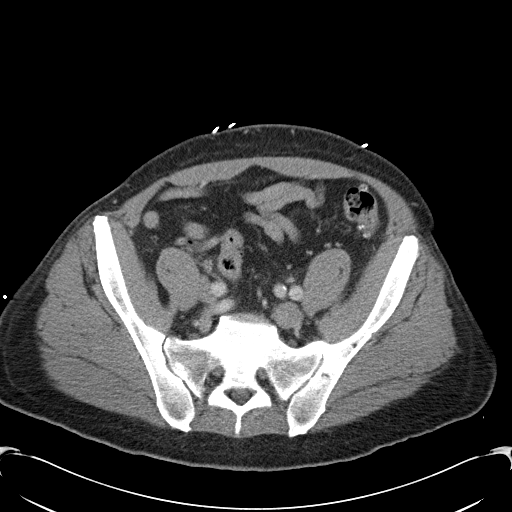
[im 44/104  soft-tissue]
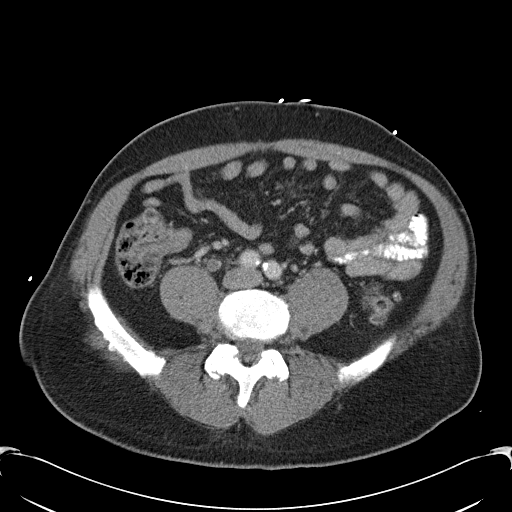
[im 49/104  soft-tissue]
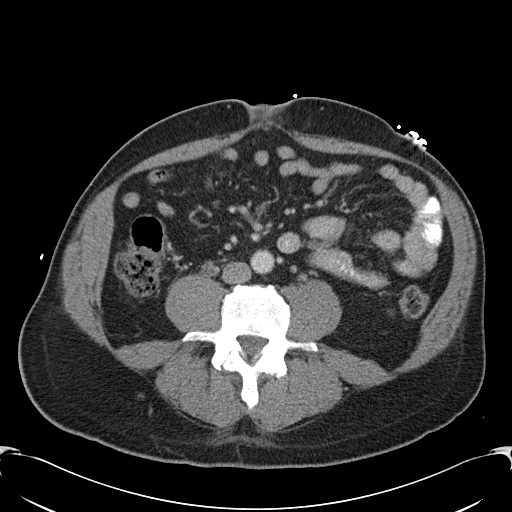
[im 55/104  soft-tissue]
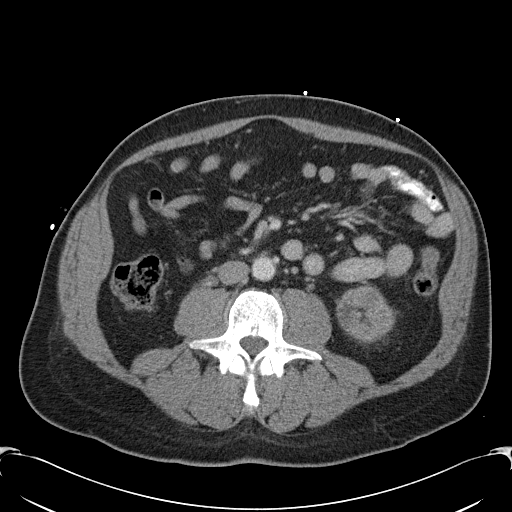
[im 60/104  soft-tissue]
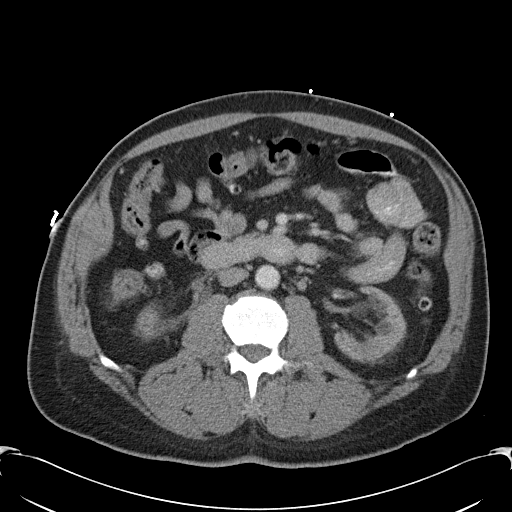
[im 60/104  bone]
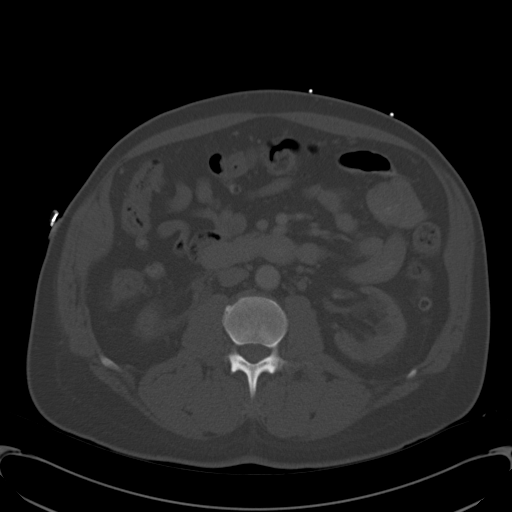
[im 71/104  soft-tissue]
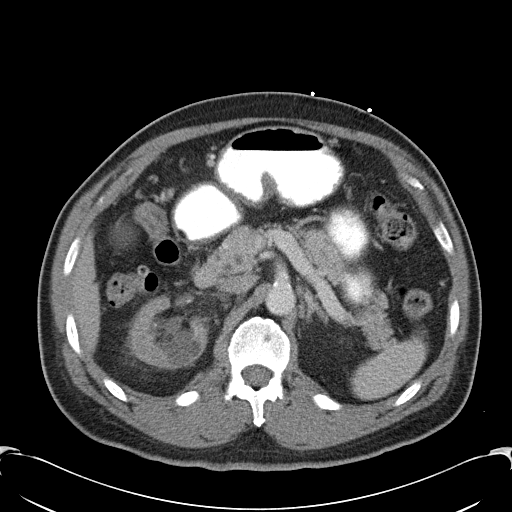
[im 76/104  soft-tissue]
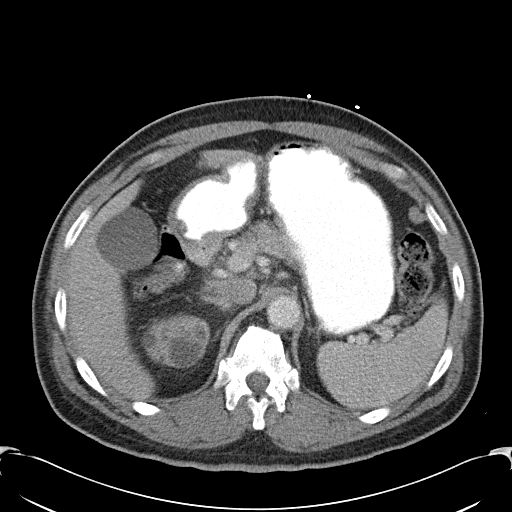
[im 82/104  soft-tissue]
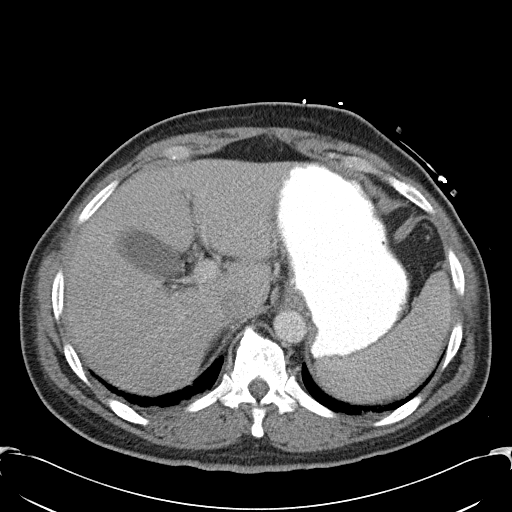
[im 93/104  soft-tissue]
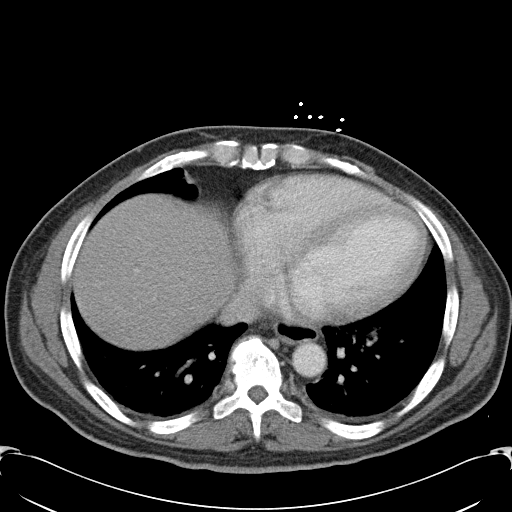
[im 98/104  soft-tissue]
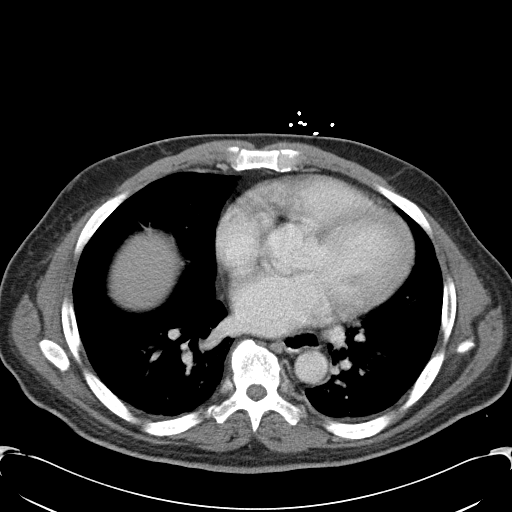

[Series 5: coronals · coronal · 1.01mm/px · 3 of 126 slices shown]
[im 42/126  soft-tissue]
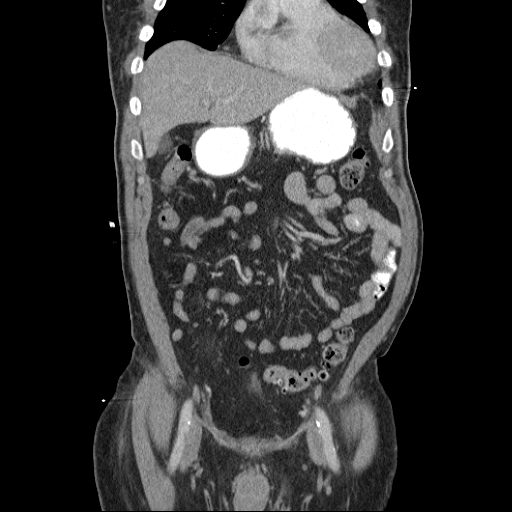
[im 56/126  soft-tissue]
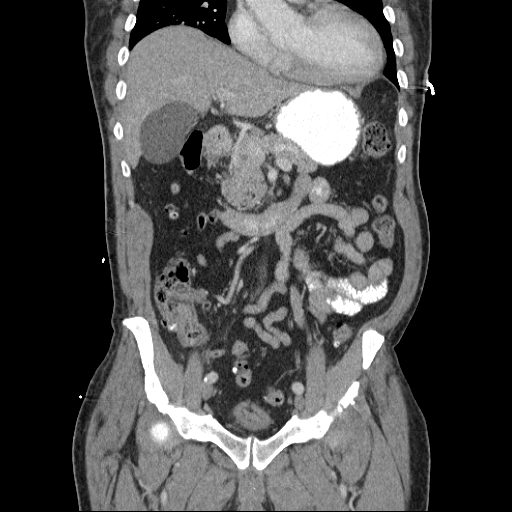
[im 70/126  soft-tissue]
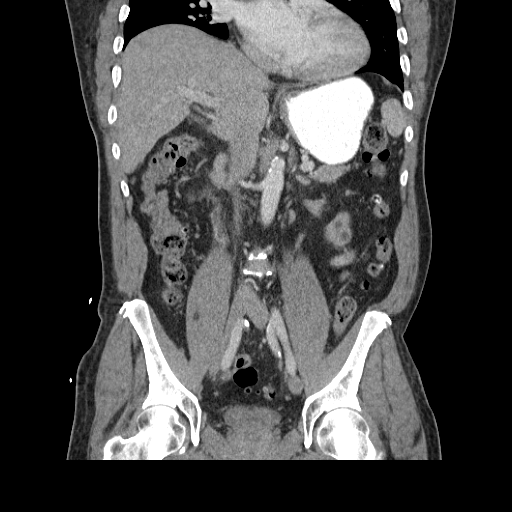

[17 of 46 positions shown; findings below may reference images not displayed]

FINDINGS: Small sliding hiatal hernia.  Multiple colonic
diverticula without evidence of diverticulitis.  Bilateral renal
calcifications and parapelvic and cortical cysts are unchanged.
Essentially empty urinary bladder.  Interval multiple tiny calculi
in the distal right ureter with interval mild dilatation of the
right ureter and renal collecting system. Interval right
perinephric soft tissue stranding.

Bilateral inguinal hernias containing fat are again demonstrated.
No enlarged lymph nodes.  Mildly enlarged prostate gland.  Stable
left anterior subcutaneous mass.

Unremarkable liver, spleen, pancreas, gallbladder and adrenal
glands.  Minimal dependent atelectasis at the lung bases.  Lumbar
and lower thoracic spine degenerative changes.
IMPRESSION: 1.  Interval multiple tiny calculi in the distal right ureter with
interval mild right hydronephrosis and hydroureter.
2.  Otherwise, stable bilateral renal calculi and cysts.
3.  Stable bilateral inguinal hernias containing fat.
4.  Stable left anterior abdominal subcutaneous mass, possibly
representing a sebaceous cyst.
5.  Small sliding hiatal hernia.
6.  Colonic diverticulosis.

## 2013-03-20 ENCOUNTER — Other Ambulatory Visit: Payer: Self-pay | Admitting: Ophthalmology

## 2013-03-20 NOTE — H&P (Signed)
Patient Record  [name], [name] Patient Number:  [id number] Date of Birth:  [birth year] Age:  67 years old    Gender:  Male Date of Evaluation:  March 20, 2013  Chief Complaint:   67 year old male complains of decreased vision right eye. He is referred for Cataract evaluation.  He is post Cataract surgery OS with a complication at surgery. He reports feeling that seeing for driving is difficult. Driving at night is darker and he reports having glare from headlights. He sees multiple lights at the stop light. His depth perception is off. History of Present Illness:   67 yo male had cataract surgery os 5/12.  c/O blurred vision.  Diabetic.  CHO controlled.  Patient on dialysis.  Presents today for diabetic eye exam.  Has glaucoma.  Lost to follow up.  On xalatan ou.  Presents for evaluation. Past History:  Allergies:  Codiene, nausea, Active Medications:   Ocular Medications:  Bromday  1 gtt OS q d Combigan 1 gtt OS BID Lumigan 1 gtt  OS q HS Xalatan 1 gtt OD q HS Other Medications:  Allopurinol 300mg  daily, Januvia 25mg  daily, Glimepride 4mg  daily, Amlodipine 5mg  daily, Metoprolol 50mg  daily, Doxazosin 8mg  daily, Renvela 800mg  TID, Fosrenal 1500mg  as directed, Sensipar 90mg  daily,  Birth History:  none Past Ocular History:   cataract  diabetes mellitus  glaucoma  Past Medical History:   Hypertension, 250.00 Diabetes mellitus, non-insulin  dependent, Renal Failure Past Surgical History:   Neck/Spinal Surgery Fisqular Insertion Cataract OS Heart Catherization Family History:  no amblyopia, no blindness, no cataracts, no crossed eyes, no diabetic retinopathy, no glaucoma, no macular degeneration, no retinal detachment, + cancer (mother), no diabetes, + heart disease (sister), + high blood pressure (father), + stroke (sister) Social History:   Smoking Status: never smoker  Alcohol:  +  socially   Driving status:  driving Marital status:  married Review of Systems:    Constitutional:  no fever, no weight loss    Eyes: + blurred vision  Ear/Nose/Throat:  no hearing loss, no sinus problems    Cardiovascular:  no chest pain, no irregular heart beats    Respiratory:  no shortness of breath, no wheezing    Gastrointestinal:  no abdominal pain, no nausea    Genitourinary:  no blood in urine, no discomfort    Musculoskeletal: + joint pain  Integumentary skin/breast:  no rashes, no skin tumors    Neurological:  no numbness, no weakness    Psychiatric:  no anxiety, no depression    Endocrine:  no heat intolerance, no thyroid problems    Hematologic/Lymphatic:  no anemia, no unusual bleeding    Allergic/Immunologic:  no hives, no seasonal allergies    Examination:  Visual Acuity:   Distance VA cc:  OD: 20/60    OS: 20/60 IOP:  OD:  15     OS:  16    @ 04:59PM (Goldmann applanation) Manifest Refraction:    Sphere    Cyl Axis       VA         Add       VA Prism Base R:  -0.50  -0.75   75    20/50  AScan predicts +14.50 Diopter Acrysof MA50BM PC IOL OD for emmetropia L:  -0.50               20/40-                                   comments: Will change glasses after cataract surgery od.  Confrontation visual field: OU:  Normal  Motility: OU:  Normal  Pupils:  OU:  Shape, size, direct and consensual reaction normal  Adnexa:  Preauricular LN, lacrimal drainage, lacrimal glands, orbit normal  Eyelids: Eyelids:  normal Conjunctiva: OU:  bulbar, palpebral normal  Cornea:  OU:  epithelium, stroma, endothelium, tear film normal  Anterior Chamber:  OU:  depth normal, no cell, no flare, 3+ deep / clear  Iris: OU:  normal, rubeosis absent Dilation:  OU: AK-Dilate, tropicamide @ 04:12PM  Lens:  OD:  2+ nuclear sclerotic cataract; 2+ cortical cataract OS:  PC IOL in good position  Vitreous: OU:  normal  Optic Disc:  OD:  cupping: 0.65 V x 0.6 H OS:  cupping: 0.85 V x 0.8 H  Macula: OU:  normal   Vessels: OU:  normal  Periphery:  OD:  No H or E OS:  Diffuse laser burns; No new H or E; Disc pale white, post partial PRP  Orientation to person, place and time:  Normal  Mood and affect:  Normal  Impression:  366.19  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 We discussed the related symptoms , visual significance and when we intervene with surgery. We discussed the surgical techniques used, risks and benefits of surgery.  I do feel he is a good candidate for Phaco IOL OD.  He indicated understanding our discussion and felt that his questions had been answered to his satisfaction.  He indicates that hee is comfortable with proceding with the recommended treatment/care plan. He desires to have Cataract surgery OD.  Medications:   Continue Xalatan OU qhs Patient Instructions: Please do not eat anything after mignight the day before surgery. Return to clinic:  June 19th 4:45 PM. for post-operative follow-up  Schedule:  Phacoemulsification, Posterior Chamber Intraocular Lens OD x June  18th, 2014   (electronically signed) Adonis Brook, MD

## 2013-03-26 ENCOUNTER — Encounter (HOSPITAL_COMMUNITY): Payer: Self-pay | Admitting: Pharmacy Technician

## 2013-03-28 ENCOUNTER — Encounter (HOSPITAL_COMMUNITY): Payer: Self-pay

## 2013-03-28 ENCOUNTER — Encounter (HOSPITAL_COMMUNITY)
Admission: RE | Admit: 2013-03-28 | Discharge: 2013-03-28 | Disposition: A | Discharge status: Home / Self Care | Payer: Medicare Other | Source: Ambulatory Visit | Attending: Ophthalmology | Admitting: Ophthalmology

## 2013-03-28 LAB — CBC
Hemoglobin: 14.8 g/dL (ref 13.0–17.0)
RBC: 4.71 MIL/uL (ref 4.22–5.81)
WBC: 5.6 10*3/uL (ref 4.0–10.5)

## 2013-03-28 LAB — BASIC METABOLIC PANEL
GFR calc Af Amer: 5 mL/min — ABNORMAL LOW (ref >90)
GFR calc non Af Amer: 4 mL/min — ABNORMAL LOW (ref >90)
Potassium: 3.8 mEq/L (ref 3.5–5.1)
Sodium: 138 mEq/L (ref 135–145)

## 2013-03-28 NOTE — Progress Notes (Signed)
Anesthesia Chart Review:  Patient is a 67 year old male scheduled for phacoemulsification, posterior chamber intraocular lens, right eye on 04/03/13 by Dr. Anderson Malta.  History includes former smoker, DM2, HTN, BPH, hypercholesterolemia, ESRD [place] HD MWF ([place]), arthritis, anemia, cervical discectomy.  He is s/p bilateral ureter stent placement and stone retrieval on 11/01/12.  EKG on 10/01/12 showed SR, borderline low voltage in frontal leads, borderline prolonged QT interval.  CXR on 10/25/12 showed no acute cardiopulmonary findings.  Preoperative labs noted.  ISTAT on arrival due to patient being on hemodialysis.  Patient reported to his PAT RN of a history of prior cardiac cath, stress/echo at [facility] in [place] (dates?).  I'll review once available.  He did not report any history of CAD/MI.  He tolerated a urological procedure in January 2014, so unless there have been any acute changes then I would anticipate that he could proceed as planned if follow-up labs are acceptable.    George Hugh [facility] Short Stay Center/Anesthesiology Phone [phone] 03/28/2013 3:53 PM  Addendum: 03/29/13 1300 Received records from [facility].  The last cardiology note was from Dr. Chilton Si in August 2012.Cardiac cath on 04/29/10 showed normal coronaries, EF 45% by contrast, mild global LV hypokinesis.  Echo on 06/19/12 showed improved EF of 55-60%, overall normal LV systolic function, LV mildly dilated, doppler findings suggestive of abnormal relaxation pattern, mild-moderate diastolic dysfunction (Grade 1c) with elevated LA pressure.  Trace MR.  Mildly increased LA size.  Mild 1+ AR.  Trace TR.  Trace PR.  As above, I anticipate that he can proceed as planned.

## 2013-03-28 NOTE — Pre-Procedure Instructions (Signed)
[name]  03/28/2013   Your procedure is scheduled on:  April 03, 2013  Report to [place] at 10:15 AM.  Call this number if you have problems the morning of surgery: [phone]   Remember:   Do not eat food or drink liquids after midnight.   Take these medicines the morning of surgery with A SIP OF WATER: allopurinol(zyloprim), amlodipine(norvasc), doxazosin(cardura), metoprolol(lopressor), sevelamer(renvela)   Do not wear jewelry.  Do not wear lotions, powders, or perfumes. You may wear deodorant.  Do not shave 48 hours prior to surgery. Men may shave face and neck.  Do not bring valuables to the hospital.  [facility] is not responsible for any belongings or valuables.  Contacts, dentures or bridgework may not be worn into surgery.  Leave suitcase in the car. After surgery it may be brought to your room.  For patients admitted to the hospital, checkout time is 11:00 AM the day of  discharge.   Patients discharged the day of surgery will not be allowed to drive  home.  Name and phone number of your driver:   Special Instructions: Shower using CHG 2 nights before surgery and the night before surgery.  If you shower the day of surgery use CHG.  Use special wash - you have one bottle of CHG for all showers.  You should use approximately 1/3 of the bottle for each shower.   Please read over the following fact sheets that you were given: Pain Booklet, Coughing and Deep Breathing and Surgical Site Infection Prevention

## 2013-03-29 ENCOUNTER — Encounter (HOSPITAL_COMMUNITY): Payer: Self-pay

## 2013-04-03 ENCOUNTER — Encounter (HOSPITAL_COMMUNITY): Payer: Self-pay | Admitting: *Deleted

## 2013-04-03 ENCOUNTER — Encounter (HOSPITAL_COMMUNITY)
Admission: RE | Disposition: A | Discharge status: Home / Self Care | Payer: Self-pay | Source: Ambulatory Visit | Attending: Ophthalmology

## 2013-04-03 ENCOUNTER — Encounter (HOSPITAL_COMMUNITY): Payer: Self-pay | Admitting: Vascular Surgery

## 2013-04-03 ENCOUNTER — Ambulatory Visit (HOSPITAL_COMMUNITY)
Admission: RE | Admit: 2013-04-03 | Discharge: 2013-04-03 | Disposition: A | Discharge status: Home / Self Care | Payer: Medicare Other | Source: Ambulatory Visit | Attending: Ophthalmology | Admitting: Ophthalmology

## 2013-04-03 ENCOUNTER — Ambulatory Visit (HOSPITAL_COMMUNITY): Payer: Medicare Other | Admitting: Anesthesiology

## 2013-04-03 DIAGNOSIS — N189 Chronic kidney disease, unspecified: Secondary | ICD-10-CM | POA: Insufficient documentation

## 2013-04-03 DIAGNOSIS — H4011X0 Primary open-angle glaucoma, stage unspecified: Secondary | ICD-10-CM | POA: Insufficient documentation

## 2013-04-03 DIAGNOSIS — Z885 Allergy status to narcotic agent status: Secondary | ICD-10-CM | POA: Insufficient documentation

## 2013-04-03 DIAGNOSIS — H25049 Posterior subcapsular polar age-related cataract, unspecified eye: Secondary | ICD-10-CM | POA: Insufficient documentation

## 2013-04-03 DIAGNOSIS — I129 Hypertensive chronic kidney disease with stage 1 through stage 4 chronic kidney disease, or unspecified chronic kidney disease: Secondary | ICD-10-CM | POA: Insufficient documentation

## 2013-04-03 DIAGNOSIS — E1139 Type 2 diabetes mellitus with other diabetic ophthalmic complication: Secondary | ICD-10-CM | POA: Insufficient documentation

## 2013-04-03 DIAGNOSIS — H409 Unspecified glaucoma: Secondary | ICD-10-CM | POA: Insufficient documentation

## 2013-04-03 DIAGNOSIS — E11359 Type 2 diabetes mellitus with proliferative diabetic retinopathy without macular edema: Secondary | ICD-10-CM | POA: Insufficient documentation

## 2013-04-03 DIAGNOSIS — Z79899 Other long term (current) drug therapy: Secondary | ICD-10-CM | POA: Insufficient documentation

## 2013-04-03 DIAGNOSIS — H2510 Age-related nuclear cataract, unspecified eye: Secondary | ICD-10-CM | POA: Insufficient documentation

## 2013-04-03 HISTORY — PX: CATARACT EXTRACTION W/PHACO: SHX586

## 2013-04-03 SURGERY — PHACOEMULSIFICATION, CATARACT, WITH IOL INSERTION
Anesthesia: Monitor Anesthesia Care | Site: Eye | Laterality: Right | Wound class: Clean

## 2013-04-03 MED ORDER — PROPOFOL 10 MG/ML IV BOLUS
INTRAVENOUS | Status: DC | PRN
  Administered 2013-04-03: 30 mg via INTRAVENOUS

## 2013-04-03 MED ORDER — ACETAZOLAMIDE SODIUM 500 MG IJ SOLR
INTRAMUSCULAR | Status: AC
  Filled 2013-04-03: qty 500

## 2013-04-03 MED ORDER — BSS IO SOLN
INTRAOCULAR | Status: AC
  Filled 2013-04-03: qty 500

## 2013-04-03 MED ORDER — DEXAMETHASONE SODIUM PHOSPHATE 10 MG/ML IJ SOLN
INTRAMUSCULAR | Status: DC | PRN
  Administered 2013-04-03: 10 mg via INTRAVENOUS

## 2013-04-03 MED ORDER — LIDOCAINE HCL (CARDIAC) 20 MG/ML IV SOLN
INTRAVENOUS | Status: DC | PRN
  Administered 2013-04-03: 60 mg via INTRAVENOUS

## 2013-04-03 MED ORDER — BUPIVACAINE HCL (PF) 0.75 % IJ SOLN
INTRAMUSCULAR | Status: AC
  Filled 2013-04-03: qty 10

## 2013-04-03 MED ORDER — DEXAMETHASONE SODIUM PHOSPHATE 10 MG/ML IJ SOLN
INTRAMUSCULAR | Status: AC
  Filled 2013-04-03: qty 1

## 2013-04-03 MED ORDER — CEFAZOLIN SUBCONJUNCTIVAL INJECTION 100 MG/0.5 ML
100.0000 mg | INJECTION | SUBCONJUNCTIVAL | Status: DC
  Filled 2013-04-03: qty 0.5

## 2013-04-03 MED ORDER — MIDAZOLAM HCL 5 MG/5ML IJ SOLN
INTRAMUSCULAR | Status: DC | PRN
  Administered 2013-04-03: 2 mg via INTRAVENOUS

## 2013-04-03 MED ORDER — SODIUM CHLORIDE 0.9 % IV SOLN
INTRAVENOUS | Status: DC
  Administered 2013-04-03: 20 mL/h via INTRAVENOUS

## 2013-04-03 MED ORDER — TETRACAINE HCL 0.5 % OP SOLN
OPHTHALMIC | Status: DC | PRN
  Administered 2013-04-03: 2 [drp] via OPHTHALMIC

## 2013-04-03 MED ORDER — EPINEPHRINE HCL 1 MG/ML IJ SOLN
INTRAMUSCULAR | Status: AC
  Filled 2013-04-03: qty 1

## 2013-04-03 MED ORDER — PROVISC 10 MG/ML IO SOLN
INTRAOCULAR | Status: DC | PRN
  Administered 2013-04-03: 8.5 mg via INTRAOCULAR

## 2013-04-03 MED ORDER — PHENYLEPHRINE HCL 2.5 % OP SOLN
1.0000 [drp] | OPHTHALMIC | Status: AC | PRN
  Administered 2013-04-03 (×2): 1 [drp] via OPHTHALMIC

## 2013-04-03 MED ORDER — GATIFLOXACIN 0.5 % OP SOLN
1.0000 [drp] | OPHTHALMIC | Status: AC | PRN
  Administered 2013-04-03 (×3): 1 [drp] via OPHTHALMIC
  Filled 2013-04-03: qty 2.5

## 2013-04-03 MED ORDER — FENTANYL CITRATE 0.05 MG/ML IJ SOLN: 25.0000 ug | INTRAMUSCULAR | Status: DC | PRN

## 2013-04-03 MED ORDER — PHENYLEPHRINE HCL 2.5 % OP SOLN
OPHTHALMIC | Status: AC
  Administered 2013-04-03: 1 [drp] via OPHTHALMIC
  Filled 2013-04-03: qty 2

## 2013-04-03 MED ORDER — SODIUM HYALURONATE 10 MG/ML IO SOLN
INTRAOCULAR | Status: AC
  Filled 2013-04-03: qty 0.85

## 2013-04-03 MED ORDER — EPINEPHRINE HCL 1 MG/ML IJ SOLN
INTRAMUSCULAR | Status: DC | PRN
  Administered 2013-04-03: 1 mg

## 2013-04-03 MED ORDER — NA CHONDROIT SULF-NA HYALURON 40-30 MG/ML IO SOLN
INTRAOCULAR | Status: DC | PRN
  Administered 2013-04-03: 0.5 mL via INTRAOCULAR

## 2013-04-03 MED ORDER — LIDOCAINE HCL 2 % IJ SOLN
INTRAMUSCULAR | Status: AC
  Filled 2013-04-03: qty 20

## 2013-04-03 MED ORDER — PREDNISOLONE ACETATE 1 % OP SUSP
1.0000 [drp] | OPHTHALMIC | Status: AC
  Administered 2013-04-03: 1 [drp] via OPHTHALMIC
  Filled 2013-04-03: qty 5

## 2013-04-03 MED ORDER — TETRACAINE HCL 0.5 % OP SOLN
2.0000 [drp] | OPHTHALMIC | Status: AC
  Administered 2013-04-03: 2 [drp] via OPHTHALMIC
  Filled 2013-04-03: qty 2

## 2013-04-03 MED ORDER — TETRACAINE HCL 0.5 % OP SOLN
OPHTHALMIC | Status: AC
  Filled 2013-04-03: qty 2

## 2013-04-03 MED ORDER — NA CHONDROIT SULF-NA HYALURON 40-30 MG/ML IO SOLN
INTRAOCULAR | Status: AC
  Filled 2013-04-03: qty 0.5

## 2013-04-03 MED ORDER — SODIUM CHLORIDE 0.9 % IV SOLN: INTRAVENOUS | Status: DC

## 2013-04-03 MED ORDER — BACITRACIN-POLYMYXIN B 500-10000 UNIT/GM OP OINT
TOPICAL_OINTMENT | OPHTHALMIC | Status: AC
  Filled 2013-04-03: qty 3.5

## 2013-04-03 MED ORDER — SODIUM CHLORIDE 0.9 % IV SOLN
INTRAVENOUS | Status: DC | PRN
  Administered 2013-04-03: 16:00:00 via INTRAVENOUS

## 2013-04-03 SURGICAL SUPPLY — 58 items
APPLICATOR COTTON TIP 6IN STRL (MISCELLANEOUS) ×2 IMPLANT
APPLICATOR DR MATTHEWS STRL (MISCELLANEOUS) ×2 IMPLANT
BAG MINI COLL DRAIN (WOUND CARE) ×2 IMPLANT
BLADE EYE MINI 60D BEAVER (BLADE) IMPLANT
BLADE KERATOME 2.75 (BLADE) ×4 IMPLANT
BLADE STAB KNIFE 15DEG (BLADE) IMPLANT
CANNULA ANTERIOR CHAMBER 27GA (MISCELLANEOUS) IMPLANT
CLOTH BEACON ORANGE TIMEOUT ST (SAFETY) ×2 IMPLANT
DRAPE OPHTHALMIC 77X100 STRL (CUSTOM PROCEDURE TRAY) ×2 IMPLANT
DRAPE POUCH INSTRU U-SHP 10X18 (DRAPES) ×2 IMPLANT
DRSG TEGADERM 4X4.75 (GAUZE/BANDAGES/DRESSINGS) ×2 IMPLANT
FILTER BLUE MILLIPORE (MISCELLANEOUS) IMPLANT
GLOVE SS BIOGEL STRL SZ 6.5 (GLOVE) ×1 IMPLANT
GLOVE SUPERSENSE BIOGEL SZ 6.5 (GLOVE) ×1
GOWN SRG XL XLNG 56XLVL 4 (GOWN DISPOSABLE) ×1 IMPLANT
GOWN STRL NON-REIN LRG LVL3 (GOWN DISPOSABLE) ×2 IMPLANT
GOWN STRL NON-REIN XL XLG LVL4 (GOWN DISPOSABLE) ×1
KIT BASIN OR (CUSTOM PROCEDURE TRAY) ×2 IMPLANT
KIT ROOM TURNOVER OR (KITS) ×2 IMPLANT
KNIFE GRIESHABER SHARP 2.5MM (MISCELLANEOUS) ×2 IMPLANT
LENS IOL ACRYSOF MP POST 15.5 (Intraocular Lens) ×2 IMPLANT
MASK EYE SHIELD (GAUZE/BANDAGES/DRESSINGS) ×2 IMPLANT
NEEDLE 18GX1X1/2 (RX/OR ONLY) (NEEDLE) IMPLANT
NEEDLE 21 GA WING INFUSION (NEEDLE) IMPLANT
NEEDLE 22X1 1/2 (OR ONLY) (NEEDLE) ×2 IMPLANT
NEEDLE 25GX 5/8IN NON SAFETY (NEEDLE) ×2 IMPLANT
NEEDLE FILTER BLUNT 18X 1/2SAF (NEEDLE)
NEEDLE FILTER BLUNT 18X1 1/2 (NEEDLE) IMPLANT
NEEDLE HYPO 30X.5 LL (NEEDLE) ×4 IMPLANT
NS IRRIG 1000ML POUR BTL (IV SOLUTION) ×2 IMPLANT
PACK CATARACT CUSTOM (CUSTOM PROCEDURE TRAY) ×2 IMPLANT
PACK CATARACT MCHSCP (PACKS) ×2 IMPLANT
PACK COMBINED CATERACT/VIT 23G (OPHTHALMIC RELATED) IMPLANT
PAD ARMBOARD 7.5X6 YLW CONV (MISCELLANEOUS) ×4 IMPLANT
PAD EYE OVAL STERILE LF (GAUZE/BANDAGES/DRESSINGS) ×4 IMPLANT
PHACO TIP KELMAN 45DEG (TIP) ×2 IMPLANT
PROBE ANTERIOR 20G W/INFUS NDL (MISCELLANEOUS) IMPLANT
RING MALYGIN (MISCELLANEOUS) IMPLANT
ROLLS DENTAL (MISCELLANEOUS) IMPLANT
SHUTTLE MONARCH TYPE A (NEEDLE) ×2 IMPLANT
SPEAR EYE SURG WECK-CEL (MISCELLANEOUS) ×2 IMPLANT
SUT ETHILON 10-0 CS-B-6CS-B-6 (SUTURE)
SUT ETHILON 5 0 P 3 18 (SUTURE)
SUT ETHILON 9 0 TG140 8 (SUTURE) IMPLANT
SUT NYLON ETHILON 5-0 P-3 1X18 (SUTURE) IMPLANT
SUT PLAIN 6 0 TG1408 (SUTURE) IMPLANT
SUT POLY NON ABSORB 10-0 8 STR (SUTURE) IMPLANT
SUT VICRYL 6 0 S 29 12 (SUTURE) IMPLANT
SUTURE EHLN 10-0 CS-B-6CS-B-6 (SUTURE) IMPLANT
SYR 20CC LL (SYRINGE) IMPLANT
SYR 5ML LL (SYRINGE) ×2 IMPLANT
SYR TB 1ML LUER SLIP (SYRINGE) ×2 IMPLANT
SYRINGE 10CC LL (SYRINGE) ×2 IMPLANT
TIP ABS 45DEG FLARED 0.9MM (TIP) ×2 IMPLANT
TOWEL OR 17X24 6PK STRL BLUE (TOWEL DISPOSABLE) ×4 IMPLANT
WATER STERILE IRR 1000ML POUR (IV SOLUTION) ×2 IMPLANT
WIPE INSTRUMENT ADHESIVE BACK (MISCELLANEOUS) ×2 IMPLANT
WIPE INSTRUMENT VISIWIPE 73X73 (MISCELLANEOUS) ×2 IMPLANT

## 2013-04-03 NOTE — Transfer of Care (Signed)
Immediate Anesthesia Transfer of Care Note  Patient: [name]  Procedure(s) Performed: Procedure(s): CATARACT EXTRACTION PHACO AND INTRAOCULAR LENS PLACEMENT (IOC) (Right)  Patient Location: PACU  Anesthesia Type:MAC  Level of Consciousness: awake, alert  and oriented  Airway & Oxygen Therapy: Patient Spontanous Breathing  Post-op Assessment: Report given to PACU RN and Post -op Vital signs reviewed and stable  Post vital signs: Reviewed and stable  Complications: No apparent anesthesia complications

## 2013-04-03 NOTE — Interval H&P Note (Signed)
History and Physical Interval Note:  04/03/2013 4:22 PM  [name]  has presented today for surgery, with the diagnosis of Combined Cataract Right Eye  The various methods of treatment have been discussed with the patient and family. After consideration of risks, benefits and other options for treatment, the patient has consented to  Procedure(s): CATARACT EXTRACTION PHACO AND INTRAOCULAR LENS PLACEMENT ([place]) (Right) as a surgical intervention .  The patient's history has been reviewed, patient examined, no change in status, stable for surgery.  I have reviewed the patient's chart and labs.  Questions were answered to the patient's satisfaction.     Tiernan Suto, Lavella Hammock

## 2013-04-03 NOTE — Preoperative (Signed)
Beta Blockers   Reason not to administer Beta Blockers:Not Applicable 

## 2013-04-03 NOTE — Progress Notes (Signed)
Pt. Didn't take lopressor this am due to going to dialysis.Last dose was 1830 on 04/02/13.  Notified Dr. Orene Desanctis, stated not to give dose.

## 2013-04-03 NOTE — H&P (View-Only) (Signed)
Patient Record  [name], [name] Patient Number:  [id number] Date of Birth:  [birth year] Age:  67 years old    Gender:  Male Date of Evaluation:  March 20, 2013  Chief Complaint:   67 year old male complains of decreased vision right eye. He is referred for Cataract evaluation.  He is post Cataract surgery OS with a complication at surgery. He reports feeling that seeing for driving is difficult. Driving at night is darker and he reports having glare from headlights. He sees multiple lights at the stop light. His depth perception is off. History of Present Illness:   67 yo male had cataract surgery os 5/12.  c/O blurred vision.  Diabetic.  CHO controlled.  Patient on dialysis.  Presents today for diabetic eye exam.  Has glaucoma.  Lost to follow up.  On xalatan ou.  Presents for evaluation. Past History:  Allergies:  Codiene, nausea, Active Medications:   Ocular Medications:  Bromday  1 gtt OS q d Combigan 1 gtt OS BID Lumigan 1 gtt  OS q HS Xalatan 1 gtt OD q HS Other Medications:  Allopurinol 300mg  daily, Januvia 25mg  daily, Glimepride 4mg  daily, Amlodipine 5mg  daily, Metoprolol 50mg  daily, Doxazosin 8mg  daily, Renvela 800mg  TID, Fosrenal 1500mg  as directed, Sensipar 90mg  daily,  Birth History:  none Past Ocular History:   cataract  diabetes mellitus  glaucoma  Past Medical History:   Hypertension, 250.00 Diabetes mellitus, non-insulin  dependent, Renal Failure Past Surgical History:   Neck/Spinal Surgery Fisqular Insertion Cataract OS Heart Catherization Family History:  no amblyopia, no blindness, no cataracts, no crossed eyes, no diabetic retinopathy, no glaucoma, no macular degeneration, no retinal detachment, + cancer (mother), no diabetes, + heart disease (sister), + high blood pressure (father), + stroke (sister) Social History:   Smoking Status: never smoker  Alcohol:  +  socially   Driving status:  driving Marital status:  married Review of Systems:    Constitutional:  no fever, no weight loss    Eyes: + blurred vision  Ear/Nose/Throat:  no hearing loss, no sinus problems    Cardiovascular:  no chest pain, no irregular heart beats    Respiratory:  no shortness of breath, no wheezing    Gastrointestinal:  no abdominal pain, no nausea    Genitourinary:  no blood in urine, no discomfort    Musculoskeletal: + joint pain  Integumentary skin/breast:  no rashes, no skin tumors    Neurological:  no numbness, no weakness    Psychiatric:  no anxiety, no depression    Endocrine:  no heat intolerance, no thyroid problems    Hematologic/Lymphatic:  no anemia, no unusual bleeding    Allergic/Immunologic:  no hives, no seasonal allergies    Examination:  Visual Acuity:   Distance VA cc:  OD: 20/60    OS: 20/60 IOP:  OD:  15     OS:  16    @ 04:59PM (Goldmann applanation) Manifest Refraction:    Sphere    Cyl Axis       VA         Add       VA Prism Base R:  -0.50  -0.75   75    20/50  AScan predicts +14.50 Diopter Acrysof MA50BM PC IOL OD for emmetropia L:  -0.50               20/40-                                   comments: Will change glasses after cataract surgery od.  Confrontation visual field: OU:  Normal  Motility: OU:  Normal  Pupils:  OU:  Shape, size, direct and consensual reaction normal  Adnexa:  Preauricular LN, lacrimal drainage, lacrimal glands, orbit normal  Eyelids: Eyelids:  normal Conjunctiva: OU:  bulbar, palpebral normal  Cornea:  OU:  epithelium, stroma, endothelium, tear film normal  Anterior Chamber:  OU:  depth normal, no cell, no flare, 3+ deep / clear  Iris: OU:  normal, rubeosis absent Dilation:  OU: AK-Dilate, tropicamide @ 04:12PM  Lens:  OD:  2+ nuclear sclerotic cataract; 2+ cortical cataract OS:  PC IOL in good position  Vitreous: OU:  normal  Optic Disc:  OD:  cupping: 0.65 V x 0.6 H OS:  cupping: 0.85 V x 0.8 H  Macula: OU:  normal   Vessels: OU:  normal  Periphery:  OD:  No H or E OS:  Diffuse laser burns; No new H or E; Disc pale white, post partial PRP  Orientation to person, place and time:  Normal  Mood and affect:  Normal  Impression:  366.19  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 We discussed the related symptoms , visual significance and when we intervene with surgery. We discussed the surgical techniques used, risks and benefits of surgery.  I do feel he is a good candidate for Phaco IOL OD.  He indicated understanding our discussion and felt that his questions had been answered to his satisfaction.  He indicates that hee is comfortable with proceding with the recommended treatment/care plan. He desires to have Cataract surgery OD.  Medications:   Continue Xalatan OU qhs Patient Instructions: Please do not eat anything after mignight the day before surgery. Return to clinic:  June 19th 4:45 PM. for post-operative follow-up  Schedule:  Phacoemulsification, Posterior Chamber Intraocular Lens OD x June  18th, 2014   (electronically signed) Adonis Brook, MD

## 2013-04-03 NOTE — Anesthesia Preprocedure Evaluation (Addendum)
Anesthesia Evaluation  Patient identified by MRN, date of birth, ID band Patient awake    Reviewed: Allergy & Precautions, H&P , NPO status , Patient's Chart, lab work & pertinent test results  History of Anesthesia Complications Negative for: history of anesthetic complications  Airway Mallampati: I TM Distance: >3 FB Neck ROM: Full    Dental   Pulmonary neg pulmonary ROS,  breath sounds clear to auscultation        Cardiovascular hypertension, Rhythm:Regular Rate:Normal     Neuro/Psych    GI/Hepatic negative GI ROS, Neg liver ROS,   Endo/Other  diabetes  Renal/GU ESRF and DialysisRenal disease     Musculoskeletal negative musculoskeletal ROS (+)   Abdominal   Peds  Hematology negative hematology ROS (+)   Anesthesia Other Findings   Reproductive/Obstetrics                          Anesthesia Physical Anesthesia Plan  ASA: III  Anesthesia Plan: MAC   Post-op Pain Management:    Induction: Intravenous  Airway Management Planned: Simple Face Mask  Additional Equipment:   Intra-op Plan:   Post-operative Plan:   Informed Consent: I have reviewed the patients History and Physical, chart, labs and discussed the procedure including the risks, benefits and alternatives for the proposed anesthesia with the patient or authorized representative who has indicated his/her understanding and acceptance.     Plan Discussed with: CRNA and Surgeon  Anesthesia Plan Comments:        Anesthesia Quick Evaluation

## 2013-04-03 NOTE — Op Note (Signed)
Elmyra Ricks 04/03/2013 Cataract: Combined, Nuclear  Procedure: Phacoemulsification, Posterior Chamber Intra-ocular Lens Operative Eye:  right eye  Surgeon: Adonis Brook Estimated Blood Loss: minimal Specimens for Pathology:  None Complications: none  The patient was prepared and draped in the usual manner for ocular surgery on the right eye. A Cook lid speculum was placed. A peripheral clear corneal incision was made at the surgical limbus centered at the 11:00 meridian. A separate clear corneal stab incision was made with a 15 degree blade at the 2:00 meridian to permit bi-manual technique. Viscoat and  Provisc as an underlying layer next to the capsule was instilled into the anterior chamber through that incision.  A keratome was used to create a self sealing incision entering the anterior chamber at the 11:00 meridian. A capsulorhexis was performed using a bent 25g needle. The lens was hydrodissected and the nucleus was hydrodilineated using a Nichammin cannula. The Chang chopper was inserted and used to rotate the lens to insure adequate lens mobility. The phacoemulsification handpiece was inserted and a combined phaco-chop technique was employed, fracturing the lens into separate sections with subsequent removal with the phaco handpiece.   The I/A cannula was used to remove remaining lens cortex. Provisc was instilled and used to deepen the anterior chamber and posterior capsule bag. The Monarch injector was used to place a folded Acrysof MA50BM PC IOL, + 14.50  diopters, into the capsule bag. A Sinskey lens hook was used to dial in the trailing haptic.  The I/A cannula was used to remove the viscoelastic from the anterior chamber. BSS was used to bring IOP to the desired range and the wound was checked to insure it was watertight. Subconjunctival injections of Ancef 100/0.36ml and Dexamethasone 0.5 ml of a 10mg /38ml solution were placed without complication. The lid speculum and drapes were  removed and the patient's eye was patched with Polymixin/Bacitracin ophthalmic ointment. An eye shield was placed and the patient was transferred alert and conversant from the operating room to the post-operative recovery area.   Adonis Brook, MD

## 2013-04-04 NOTE — Anesthesia Postprocedure Evaluation (Signed)
  Anesthesia Post-op Note  Patient: [name]  Procedure(s) Performed: Procedure(s): CATARACT EXTRACTION PHACO AND INTRAOCULAR LENS PLACEMENT (IOC) (Right)  Patient Location: Short Stay  Anesthesia Type:MAC  Level of Consciousness: awake and alert   Airway and Oxygen Therapy: Patient Spontanous Breathing  Post-op Pain: mild  Post-op Assessment: Post-op Vital signs reviewed, Patient's Cardiovascular Status Stable, Respiratory Function Stable, Patent Airway, No signs of Nausea or vomiting and Pain level controlled  Post-op Vital Signs: stable  Complications: No apparent anesthesia complications

## 2013-04-09 ENCOUNTER — Encounter (HOSPITAL_COMMUNITY): Payer: Self-pay | Admitting: Ophthalmology

## 2013-05-21 ENCOUNTER — Ambulatory Visit: Payer: Self-pay | Admitting: Vascular Surgery

## 2013-05-22 ENCOUNTER — Ambulatory Visit: Payer: Self-pay | Admitting: Vascular Surgery

## 2013-05-23 ENCOUNTER — Ambulatory Visit: Payer: Self-pay | Admitting: Vascular Surgery

## 2013-07-04 ENCOUNTER — Ambulatory Visit: Payer: Self-pay | Admitting: Vascular Surgery

## 2013-07-04 LAB — BASIC METABOLIC PANEL
BUN: 26 mg/dL — ABNORMAL HIGH (ref 7–18)
Co2: 32 mmol/L (ref 21–32)
EGFR (African American): 5 — ABNORMAL LOW
EGFR (Non-African Amer.): 4 — ABNORMAL LOW
Glucose: 189 mg/dL — ABNORMAL HIGH (ref 65–99)
Osmolality: 280 (ref 275–301)

## 2013-07-04 LAB — CBC
HGB: 12.7 g/dL — ABNORMAL LOW (ref 13.0–18.0)
MCH: 30.8 pg (ref 26.0–34.0)
MCHC: 33 g/dL (ref 32.0–36.0)
Platelet: 215 10*3/uL (ref 150–440)
RBC: 4.12 10*6/uL — ABNORMAL LOW (ref 4.40–5.90)
WBC: 5.3 10*3/uL (ref 3.8–10.6)

## 2013-07-11 ENCOUNTER — Ambulatory Visit: Payer: Self-pay | Admitting: Vascular Surgery

## 2013-07-11 LAB — POTASSIUM: Potassium: 4.4 mmol/L (ref 3.5–5.1)

## 2013-08-16 ENCOUNTER — Other Ambulatory Visit: Payer: Self-pay | Admitting: Ophthalmology

## 2013-08-16 NOTE — H&P (Signed)
Patient Record  [name], [name] Patient Number:  [id number] Date of Birth:  [birth year] Age:  67 years old    Gender:  Male Date of Evaluation:  August 16, 2013  Chief Complaint:   67 year old malehas had DM since 1989. He presents for PRP OD for PDR with Vitreous Hemorrhage OD. He reporst that his vision is worse OD. History of Present Illness:   67 yo male had cataract surgery os 5/12. surgery os 5/12.  c/O blurred vision.  Diabetic x 1989.  CHO controlled.  Patient on dialysis.  Presents today for diabetic eye exam.  Has glaucoma.  Lost to follow up.  On xalatan ou.  Presents for evaluation. Past History:  Allergies:  Codiene, nausea, Active Medications:   Ocular Medications:  Bromday  1 gtt OS q d Combigan 1 gtt OS BID Lumigan 1 gtt  OS q HS Xalatan 1 gtt OD q HS Other Medications:  Allopurinol 300mg  daily, Januvia 25mg  daily, Glimepride 4mg  daily, Amlodipine 5mg  daily, Metoprolol 50mg  daily, Doxazosin 8mg  daily, Renvela 800mg  TID, Fosrenal 1500mg  as directed, Sensipar 90mg  daily,  Birth History:  none Past Ocular History:   cataract  diabetes mellitus  glaucoma  Past Medical History:   Hypertension, 250.00 Diabetes mellitus, non-insulin  dependent, Renal Failure Past Surgical History:   Neck/Spinal Surgery Fisqular Insertion Cataract OS Heart Catherization Family History:  no amblyopia, no blindness, no cataracts, no crossed eyes, no diabetic retinopathy, no glaucoma, no macular degeneration, no retinal detachment, + cancer (mother), no diabetes, + heart disease (sister), + high blood pressure (father), + stroke (sister) Social History:   Smoking Status: never smoker    Alcohol:  +  socially   Driving status:  driving   Marital status:  married Review of Systems:   Constitutional:  no fever, no weight loss    Eyes: + blurred vision  Ear/Nose/Throat:  no hearing loss, no sinus problems    Cardiovascular:  no chest pain, no irregular heart beats    Respiratory:  no shortness of  breath, no wheezing    Gastrointestinal:  no abdominal pain, no nausea    Genitourinary:  no blood in urine, no discomfort    Musculoskeletal: + joint pain  Integumentary skin/breast:  no rashes, no skin tumors    Neurological:  no numbness, no weakness    Psychiatric:  no anxiety, no depression    Endocrine:  no heat intolerance, no thyroid problems    Hematologic/Lymphatic:  no anemia, no unusual bleeding    Allergic/Immunologic:  no hives, no seasonal allergies    Examination:  Visual Acuity:   Distance VA [place]:  OD: 20/100 ph NI IOP:  OD:  18    @ 03:55PM (Goldmann applanation) comments: Will change glasses after cataract surgery od.  Confrontation visual field: OU:  Normal  Motility: OU:  Normal  Pupils:  OU:  Shape, size, direct and consensual reaction normal  Adnexa:  Preauricular LN, lacrimal drainage, lacrimal glands, orbit normal  Eyelids: Eyelids:  normal Conjunctiva: OD:  Injected  OS:  bulbar, palpebral normal  Cornea:  OD:  spk OS:  epithelium, stroma, endothelium, tear film normal  Anterior Chamber:  OD:  3+ deep, trace flare OS:  depth normal, no cell, no flare, 3+ deep / clear  Iris: OU:  normal, rubeosis absent Dilation:  OU: AK-Dilate, tropicamide @ 04:12PM  Lens: OU:  PC IOL in good position  Vitreous:  OD:  3+, vitreous hemorrhage,    2nd order  vessels visible OS:  normal  Optic Disc:  OD:  cupping: 0.65 V x 0.6 H  OS:  cupping: 0.85 V x 0.8 H  Macula: OU:  normal   Vessels: OU:  PDR  Periphery: OD:  NVE OS:  Diffuse laser burns; No new H or E; Disc pale white, post partial PRP  Orientation to person, place and time:  Normal  Mood and affect:  Normal  Impression:  362.02  Proliferative Diabetic Retinopathy OU 379.23  -Vitreous Hemorrhage OD, increased v43.10  Pseudophakia OU -  -post Phaco IOL OD x 04/04/2013 365.11  Primary Open Angle Glaucoma OU: contolled 365.72  Glaucoma, moderate  250.50  Diabetes, type II, with ophthalmic manifestations (not  stated as controlled) Plan/Treatment:  His vitreous hemorrhage today is too dense for PRP. I have recommended proceeding with Vitrectomy, Membrane Peeling and Endolaser OD x 09/04/2013. He indicated understanding our discussion and felt that his questions had been answered to his satisfaction.  He indicates that he desires to proceed with Vitrectomy and Endolaser OD.   Medications:   Continue Xalatan OU qhs Patient Instructions: Please do not eat anything after mignight the day before surgery. Return to clinic:  November 20th, 2014 for post-operative follow-up  Schedule:  Pars Plana, Vitrectomy, Membrane Peeling, Endolaser OD x 09/04/2013.    (electronically signed)  Adonis Brook, MD

## 2013-08-21 ENCOUNTER — Encounter (HOSPITAL_COMMUNITY): Payer: Self-pay | Admitting: Pharmacy Technician

## 2013-09-03 ENCOUNTER — Encounter (HOSPITAL_COMMUNITY): Payer: Self-pay | Admitting: *Deleted

## 2013-09-04 ENCOUNTER — Encounter (HOSPITAL_COMMUNITY)
Admission: RE | Disposition: A | Discharge status: Home / Self Care | Payer: Self-pay | Source: Ambulatory Visit | Attending: Ophthalmology

## 2013-09-04 ENCOUNTER — Ambulatory Visit (HOSPITAL_COMMUNITY)
Admission: RE | Admit: 2013-09-04 | Discharge: 2013-09-04 | Disposition: A | Discharge status: Home / Self Care | Payer: Medicare Other | Source: Ambulatory Visit | Attending: Ophthalmology | Admitting: Ophthalmology

## 2013-09-04 ENCOUNTER — Encounter (HOSPITAL_COMMUNITY): Payer: Self-pay | Admitting: *Deleted

## 2013-09-04 ENCOUNTER — Ambulatory Visit (HOSPITAL_COMMUNITY): Payer: Medicare Other | Admitting: Anesthesiology

## 2013-09-04 ENCOUNTER — Encounter (HOSPITAL_COMMUNITY): Payer: Medicare Other | Admitting: Anesthesiology

## 2013-09-04 DIAGNOSIS — H4310 Vitreous hemorrhage, unspecified eye: Secondary | ICD-10-CM | POA: Insufficient documentation

## 2013-09-04 DIAGNOSIS — Z9849 Cataract extraction status, unspecified eye: Secondary | ICD-10-CM | POA: Insufficient documentation

## 2013-09-04 DIAGNOSIS — Z961 Presence of intraocular lens: Secondary | ICD-10-CM | POA: Insufficient documentation

## 2013-09-04 DIAGNOSIS — H409 Unspecified glaucoma: Secondary | ICD-10-CM | POA: Insufficient documentation

## 2013-09-04 DIAGNOSIS — E11359 Type 2 diabetes mellitus with proliferative diabetic retinopathy without macular edema: Secondary | ICD-10-CM | POA: Insufficient documentation

## 2013-09-04 DIAGNOSIS — Z87891 Personal history of nicotine dependence: Secondary | ICD-10-CM | POA: Insufficient documentation

## 2013-09-04 DIAGNOSIS — I120 Hypertensive chronic kidney disease with stage 5 chronic kidney disease or end stage renal disease: Secondary | ICD-10-CM | POA: Insufficient documentation

## 2013-09-04 DIAGNOSIS — E1139 Type 2 diabetes mellitus with other diabetic ophthalmic complication: Secondary | ICD-10-CM | POA: Insufficient documentation

## 2013-09-04 DIAGNOSIS — H4010X0 Unspecified open-angle glaucoma, stage unspecified: Secondary | ICD-10-CM | POA: Insufficient documentation

## 2013-09-04 DIAGNOSIS — Z992 Dependence on renal dialysis: Secondary | ICD-10-CM | POA: Insufficient documentation

## 2013-09-04 DIAGNOSIS — N186 End stage renal disease: Secondary | ICD-10-CM | POA: Insufficient documentation

## 2013-09-04 HISTORY — PX: MEMBRANE PEEL: SHX5967

## 2013-09-04 HISTORY — PX: PHOTOCOAGULATION WITH LASER: SHX6027

## 2013-09-04 HISTORY — PX: PARS PLANA VITRECTOMY: SHX2166

## 2013-09-04 LAB — BASIC METABOLIC PANEL
BUN: 22 mg/dL (ref 6–23)
CO2: 27 mEq/L (ref 19–32)
Chloride: 94 mEq/L — ABNORMAL LOW (ref 96–112)
GFR calc Af Amer: 6 mL/min — ABNORMAL LOW (ref >90)
Glucose, Bld: 174 mg/dL — ABNORMAL HIGH (ref 70–99)
Potassium: 4.3 mEq/L (ref 3.5–5.1)
Sodium: 137 mEq/L (ref 135–145)

## 2013-09-04 LAB — CBC
HCT: 44.7 % (ref 39.0–52.0)
Hemoglobin: 14.8 g/dL (ref 13.0–17.0)
RBC: 4.62 MIL/uL (ref 4.22–5.81)
WBC: 7.8 10*3/uL (ref 4.0–10.5)

## 2013-09-04 LAB — GLUCOSE, CAPILLARY: Glucose-Capillary: 147 mg/dL — ABNORMAL HIGH (ref 70–99)

## 2013-09-04 SURGERY — PARS PLANA VITRECTOMY WITH 23 GAUGE
Anesthesia: Monitor Anesthesia Care | Site: Eye | Laterality: Right | Wound class: Clean

## 2013-09-04 MED ORDER — SODIUM HYALURONATE 10 MG/ML IO SOLN
INTRAOCULAR | Status: AC
  Filled 2013-09-04: qty 0.85

## 2013-09-04 MED ORDER — ACETAZOLAMIDE SODIUM 500 MG IJ SOLR
INTRAMUSCULAR | Status: AC
  Filled 2013-09-04: qty 500

## 2013-09-04 MED ORDER — LIDOCAINE HCL 2 % IJ SOLN
INTRAMUSCULAR | Status: AC
  Filled 2013-09-04: qty 20

## 2013-09-04 MED ORDER — BACITRACIN-POLYMYXIN B 500-10000 UNIT/GM OP OINT
TOPICAL_OINTMENT | OPHTHALMIC | Status: AC
  Filled 2013-09-04: qty 3.5

## 2013-09-04 MED ORDER — PREDNISOLONE ACETATE 1 % OP SUSP
1.0000 [drp] | OPHTHALMIC | Status: AC
  Administered 2013-09-04: 1 [drp] via OPHTHALMIC
  Filled 2013-09-04: qty 5

## 2013-09-04 MED ORDER — BUPIVACAINE HCL (PF) 0.75 % IJ SOLN
INTRAMUSCULAR | Status: AC
  Filled 2013-09-04: qty 10

## 2013-09-04 MED ORDER — TETRACAINE HCL 0.5 % OP SOLN
OPHTHALMIC | Status: AC
  Filled 2013-09-04: qty 2

## 2013-09-04 MED ORDER — PROPOFOL 10 MG/ML IV BOLUS
INTRAVENOUS | Status: DC | PRN
  Administered 2013-09-04: 20 mg via INTRAVENOUS
  Administered 2013-09-04: 30 mg via INTRAVENOUS

## 2013-09-04 MED ORDER — LIDOCAINE HCL 2 % IJ SOLN
INTRAMUSCULAR | Status: DC | PRN
  Administered 2013-09-04: 09:00:00 via RETROBULBAR

## 2013-09-04 MED ORDER — FENTANYL CITRATE 0.05 MG/ML IJ SOLN
INTRAMUSCULAR | Status: DC | PRN
  Administered 2013-09-04: 50 ug via INTRAVENOUS

## 2013-09-04 MED ORDER — TRIAMCINOLONE ACETONIDE 40 MG/ML IJ SUSP
INTRAMUSCULAR | Status: AC
  Filled 2013-09-04: qty 5

## 2013-09-04 MED ORDER — GATIFLOXACIN 0.5 % OP SOLN
OPHTHALMIC | Status: AC
  Filled 2013-09-04: qty 2.5

## 2013-09-04 MED ORDER — EPINEPHRINE HCL 1 MG/ML IJ SOLN
INTRAMUSCULAR | Status: AC
  Filled 2013-09-04: qty 1

## 2013-09-04 MED ORDER — BSS PLUS IO SOLN
INTRAOCULAR | Status: AC
  Filled 2013-09-04: qty 500

## 2013-09-04 MED ORDER — SODIUM CHLORIDE 0.9 % IV SOLN
INTRAVENOUS | Status: DC | PRN
  Administered 2013-09-04: 09:00:00 via INTRAVENOUS

## 2013-09-04 MED ORDER — GATIFLOXACIN 0.5 % OP SOLN
1.0000 [drp] | OPHTHALMIC | Status: AC | PRN
  Administered 2013-09-04 (×3): 1 [drp] via OPHTHALMIC

## 2013-09-04 MED ORDER — DEXAMETHASONE SODIUM PHOSPHATE 10 MG/ML IJ SOLN
INTRAMUSCULAR | Status: AC
  Filled 2013-09-04: qty 1

## 2013-09-04 MED ORDER — MIDAZOLAM HCL 2 MG/2ML IJ SOLN: 1.0000 mg | INTRAMUSCULAR | Status: DC | PRN

## 2013-09-04 MED ORDER — NA CHONDROIT SULF-NA HYALURON 40-30 MG/ML IO SOLN
INTRAOCULAR | Status: AC
  Filled 2013-09-04: qty 0.5

## 2013-09-04 MED ORDER — HYPROMELLOSE (GONIOSCOPIC) 2.5 % OP SOLN
OPHTHALMIC | Status: AC
  Filled 2013-09-04: qty 15

## 2013-09-04 MED ORDER — CEFAZOLIN SUBCONJUNCTIVAL INJECTION 100 MG/0.5 ML
INJECTION | SUBCONJUNCTIVAL | Status: DC | PRN
  Administered 2013-09-04: 50 mg via SUBCONJUNCTIVAL

## 2013-09-04 MED ORDER — EPINEPHRINE HCL 1 MG/ML IJ SOLN
INTRAOCULAR | Status: DC | PRN
  Administered 2013-09-04: 09:00:00

## 2013-09-04 MED ORDER — CEFAZOLIN SUBCONJUNCTIVAL INJECTION 100 MG/0.5 ML
200.0000 mg | INJECTION | SUBCONJUNCTIVAL | Status: DC
  Filled 2013-09-04: qty 1

## 2013-09-04 MED ORDER — PHENYLEPHRINE HCL 10 % OP SOLN
1.0000 [drp] | OPHTHALMIC | Status: AC
  Administered 2013-09-04 (×3): 1 [drp] via OPHTHALMIC
  Filled 2013-09-04: qty 5

## 2013-09-04 MED ORDER — TETRACAINE HCL 0.5 % OP SOLN
2.0000 [drp] | OPHTHALMIC | Status: AC
  Administered 2013-09-04: 2 [drp] via OPHTHALMIC
  Filled 2013-09-04: qty 2

## 2013-09-04 MED ORDER — HYPROMELLOSE (GONIOSCOPIC) 2.5 % OP SOLN
OPHTHALMIC | Status: DC | PRN
  Administered 2013-09-04: 2 [drp] via OPHTHALMIC

## 2013-09-04 MED ORDER — BSS IO SOLN
INTRAOCULAR | Status: DC | PRN
  Administered 2013-09-04: 15 mL via INTRAOCULAR

## 2013-09-04 MED ORDER — ONDANSETRON HCL 4 MG/2ML IJ SOLN
INTRAMUSCULAR | Status: DC | PRN
  Administered 2013-09-04: 4 mg via INTRAVENOUS

## 2013-09-04 MED ORDER — BACITRACIN-POLYMYXIN B 500-10000 UNIT/GM OP OINT
TOPICAL_OINTMENT | OPHTHALMIC | Status: DC | PRN
  Administered 2013-09-04: 1 via OPHTHALMIC

## 2013-09-04 MED ORDER — BSS IO SOLN
INTRAOCULAR | Status: AC
  Filled 2013-09-04: qty 15

## 2013-09-04 MED ORDER — PHENYLEPHRINE HCL 2.5 % OP SOLN
1.0000 [drp] | OPHTHALMIC | Status: DC | PRN
  Filled 2013-09-04: qty 2

## 2013-09-04 MED ORDER — MIDAZOLAM HCL 5 MG/5ML IJ SOLN
INTRAMUSCULAR | Status: DC | PRN
  Administered 2013-09-04: 2 mg via INTRAVENOUS

## 2013-09-04 MED ORDER — FENTANYL CITRATE 0.05 MG/ML IJ SOLN: 50.0000 ug | Freq: Once | INTRAMUSCULAR | Status: DC

## 2013-09-04 MED ORDER — DEXAMETHASONE SODIUM PHOSPHATE 10 MG/ML IJ SOLN
INTRAMUSCULAR | Status: DC | PRN
  Administered 2013-09-04: 5 mg

## 2013-09-04 SURGICAL SUPPLY — 76 items
APPLICATOR COTTON TIP 6IN STRL (MISCELLANEOUS) ×2 IMPLANT
APPLICATOR DR MATTHEWS STRL (MISCELLANEOUS) IMPLANT
BAG MINI COLL DRAIN (WOUND CARE) ×2 IMPLANT
BLADE EYE MINI 60D BEAVER (BLADE) IMPLANT
BLADE KERATOME 2.75 (BLADE) IMPLANT
BLADE MVR KNIFE 19G (BLADE) IMPLANT
BLADE STAB KNIFE 15DEG (BLADE) IMPLANT
CANNULA DUAL BORE 23G (CANNULA) IMPLANT
CANNULA VLV SOFT TIP 23GA (OPHTHALMIC) ×2 IMPLANT
CLOTH BEACON ORANGE TIMEOUT ST (SAFETY) IMPLANT
CORDS BIPOLAR (ELECTRODE) IMPLANT
COVER MAYO STAND STRL (DRAPES) ×2 IMPLANT
DRAPE OPHTHALMIC 77X100 STRL (CUSTOM PROCEDURE TRAY) ×2 IMPLANT
DRAPE POUCH INSTRU U-SHP 10X18 (DRAPES) ×2 IMPLANT
DRSG TEGADERM 4X4.75 (GAUZE/BANDAGES/DRESSINGS) ×2 IMPLANT
ERASER HMR WETFIELD 23G BP (MISCELLANEOUS) IMPLANT
FILTER BLUE MILLIPORE (MISCELLANEOUS) IMPLANT
FORCEPS GRIESHABER ILM 25G A (INSTRUMENTS) IMPLANT
GAS AUTO FILL CONSTEL (OPHTHALMIC)
GAS AUTO FILL CONSTELLATION (OPHTHALMIC) IMPLANT
GLOVE SS BIOGEL STRL SZ 6.5 (GLOVE) ×1 IMPLANT
GLOVE SUPERSENSE BIOGEL SZ 6.5 (GLOVE) ×1
GLOVE SURG SS PI 6.5 STRL IVOR (GLOVE) ×2 IMPLANT
GLOVE SURG SS PI 7.0 STRL IVOR (GLOVE) ×2 IMPLANT
GOWN SRG XL XLNG 56XLVL 4 (GOWN DISPOSABLE) ×1 IMPLANT
GOWN STRL NON-REIN LRG LVL3 (GOWN DISPOSABLE) ×4 IMPLANT
GOWN STRL NON-REIN XL XLG LVL4 (GOWN DISPOSABLE) ×1
HANDLE PNEUMATIC FOR CONSTEL (OPHTHALMIC) IMPLANT
ILLUMINATOR WD SAPHIRE 23G (OPHTHALMIC) IMPLANT
KIT BASIN OR (CUSTOM PROCEDURE TRAY) ×2 IMPLANT
KIT ROOM TURNOVER OR (KITS) ×2 IMPLANT
KNIFE CRESCENT 1.75 EDGEAHEAD (BLADE) IMPLANT
KNIFE GRIESHABER SHARP 2.5MM (MISCELLANEOUS) IMPLANT
MASK EYE SHIELD (GAUZE/BANDAGES/DRESSINGS) ×2 IMPLANT
NEEDLE 18GX1X1/2 (RX/OR ONLY) (NEEDLE) ×2 IMPLANT
NEEDLE 22X1 1/2 (OR ONLY) (NEEDLE) ×2 IMPLANT
NEEDLE 25GX 5/8IN NON SAFETY (NEEDLE) ×4 IMPLANT
NEEDLE 27GX1/2 REG BEVEL ECLIP (NEEDLE) IMPLANT
NEEDLE FILTER BLUNT 18X 1/2SAF (NEEDLE) ×1
NEEDLE FILTER BLUNT 18X1 1/2 (NEEDLE) ×1 IMPLANT
NEEDLE HYPO 30X.5 LL (NEEDLE) ×6 IMPLANT
NS IRRIG 1000ML POUR BTL (IV SOLUTION) ×2 IMPLANT
PACK FRAGMATOME (OPHTHALMIC) IMPLANT
PACK VITRECTOMY CUSTOM (CUSTOM PROCEDURE TRAY) ×2 IMPLANT
PAD ARMBOARD 7.5X6 YLW CONV (MISCELLANEOUS) ×4 IMPLANT
PAD EYE OVAL STERILE LF (GAUZE/BANDAGES/DRESSINGS) ×2 IMPLANT
PAK VITRECTOMY PIK  23GA (OPHTHALMIC RELATED) ×2 IMPLANT
PENCIL BIPOLAR 25GA STR DISP (OPHTHALMIC RELATED) IMPLANT
PROBE LASER ILLUM FLEX CVD 23G (OPHTHALMIC) ×2 IMPLANT
ROLLS DENTAL (MISCELLANEOUS) IMPLANT
SCISSORS TIP ADVANCED DSP 25GA (INSTRUMENTS) IMPLANT
SCISSORS TIP GREISHABER 23 VER (OPHTHALMIC) IMPLANT
SCRAPER DIAMOND DUST MEMBRANE (MISCELLANEOUS) IMPLANT
SPEAR EYE SURG WECK-CEL (MISCELLANEOUS) ×4 IMPLANT
SUT ETHILON 10 0 CS140 6 (SUTURE) IMPLANT
SUT ETHILON 4 0 P 3 18 (SUTURE) IMPLANT
SUT ETHILON 5 0 P 3 18 (SUTURE)
SUT ETHILON 9 0 TG140 8 (SUTURE) IMPLANT
SUT NYLON 10.0 BLK (SUTURE) IMPLANT
SUT NYLON ETHILON 5-0 P-3 1X18 (SUTURE) IMPLANT
SUT PLAIN 6 0 TG1408 (SUTURE) IMPLANT
SUT POLY NON ABSORB 10-0 8 STR (SUTURE) IMPLANT
SUT VICRYL 7 0 TG140 8 (SUTURE) IMPLANT
SUT VICRYL ABS 6-0 S29 18IN (SUTURE) IMPLANT
SYR 20CC LL (SYRINGE) IMPLANT
SYR 5ML LL (SYRINGE) IMPLANT
SYR TB 1ML LUER SLIP (SYRINGE) ×2 IMPLANT
SYRINGE 10CC LL (SYRINGE) IMPLANT
TAPE PAPER MEDFIX 1IN X 10YD (GAUZE/BANDAGES/DRESSINGS) ×2 IMPLANT
TAPE SURG TRANSPORE 1 IN (GAUZE/BANDAGES/DRESSINGS) ×1 IMPLANT
TAPE SURGICAL TRANSPORE 1 IN (GAUZE/BANDAGES/DRESSINGS) ×1
TOWEL OR 17X24 6PK STRL BLUE (TOWEL DISPOSABLE) ×4 IMPLANT
TUBE EXTENSION HAMMER (TUBING) IMPLANT
WATER STERILE IRR 1000ML POUR (IV SOLUTION) ×2 IMPLANT
WIPE INSTRUMENT ADHESIVE BACK (MISCELLANEOUS) ×2 IMPLANT
WIPE INSTRUMENT VISIWIPE 73X73 (MISCELLANEOUS) ×2 IMPLANT

## 2013-09-04 NOTE — Interval H&P Note (Signed)
History and Physical Interval Note:  09/04/2013 8:27 AM BP 115/58  Pulse 62  Temp(Src) 97.2 F (36.2 C) (Oral)  Resp 18  Ht 6' (1.829 m)  Wt 90.266 kg (199 lb)  BMI 26.98 kg/m2  SpO2 100%   [name]  has presented today for surgery, with the diagnosis of Vitreous Hemorrhage, Proliferative Diabetic Retinopathy Right Eye  The various methods of treatment have been discussed with the patient and family. After consideration of risks, benefits and other options for treatment, the patient has consented to  Procedure(s): PARS PLANA VITRECTOMY WITH 23 GAUGE (Right) as a surgical intervention .  The patient's history has been reviewed, patient examined, no change in status, stable for surgery.  I have reviewed the patient's chart and labs.  Questions were answered to the patient's satisfaction.     [name], Lavella Hammock

## 2013-09-04 NOTE — Anesthesia Postprocedure Evaluation (Signed)
  Anesthesia Post-op Note  Patient: [name]  Procedure(s) Performed: Procedure(s) with comments: PARS PLANA VITRECTOMY WITH 23 GAUGE (Right) MEMBRANE PEEL (Right) PHOTOCOAGULATION WITH LASER (Right) - ENDOLASER  Patient Location: PACU  Anesthesia Type:MAC  Level of Consciousness: awake  Airway and Oxygen Therapy: Patient Spontanous Breathing  Post-op Pain: mild  Post-op Assessment: Post-op Vital signs reviewed, Patient's Cardiovascular Status Stable, Respiratory Function Stable, Patent Airway, No signs of Nausea or vomiting and Pain level controlled  Post-op Vital Signs: Reviewed and stable  Complications: No apparent anesthesia complications

## 2013-09-04 NOTE — Progress Notes (Signed)
Pharm tech here phenylephrine 2.5% unable to get to at this time, due to broken machine in pharm and out of stock in our pyxis.  Dr. Anderson Malta called and informed and new orders noted. Pharm called with new orders.

## 2013-09-04 NOTE — Preoperative (Signed)
Beta Blockers   Reason not to administer Beta Blockers:Not Applicable 

## 2013-09-04 NOTE — H&P (View-Only) (Signed)
Patient Record  [name], [name] Patient Number:  [id number] Date of Birth:  [birth year] Age:  67 years old    Gender:  Male Date of Evaluation:  August 16, 2013  Chief Complaint:   67 year old malehas had DM since 1989. He presents for PRP OD for PDR with Vitreous Hemorrhage OD. He reporst that his vision is worse OD. History of Present Illness:   67 yo male had cataract surgery os 5/12.  c/O blurred vision.  Diabetic x 1989.  CHO controlled.  Patient on dialysis.  Presents today for diabetic eye exam.  Has glaucoma.  Lost to follow up.  On xalatan ou.  Presents for evaluation. Past History:  Allergies:  Codiene, nausea, Active Medications:   Ocular Medications:  Bromday  1 gtt OS q d Combigan 1 gtt OS BID Lumigan 1 gtt  OS q HS Xalatan 1 gtt OD q HS Other Medications:  Allopurinol 300mg  daily, Januvia 25mg  daily, Glimepride 4mg  daily, Amlodipine 5mg  daily, Metoprolol 50mg  daily, Doxazosin 8mg  daily, Renvela 800mg  TID, Fosrenal 1500mg  as directed, Sensipar 90mg  daily,  Birth History:  none Past Ocular History:   cataract  diabetes mellitus  glaucoma  Past Medical History:   Hypertension, 250.00 Diabetes mellitus, non-insulin  dependent, Renal Failure Past Surgical History:   Neck/Spinal Surgery Fisqular Insertion Cataract OS Heart Catherization Family History:  no amblyopia, no blindness, no cataracts, no crossed eyes, no diabetic retinopathy, no glaucoma, no macular degeneration, no retinal detachment, + cancer (mother), no diabetes, + heart disease (sister), + high blood pressure (father), + stroke (sister) Social History:   Smoking Status: never smoker    Alcohol:  +  socially   Driving status:  driving   Marital status:  married Review of Systems:   Constitutional:  no fever, no weight loss    Eyes: + blurred vision  Ear/Nose/Throat:  no hearing loss, no sinus problems    Cardiovascular:  no chest pain, no irregular heart beats    Respiratory:  no shortness of  breath, no wheezing    Gastrointestinal:  no abdominal pain, no nausea    Genitourinary:  no blood in urine, no discomfort    Musculoskeletal: + joint pain  Integumentary skin/breast:  no rashes, no skin tumors    Neurological:  no numbness, no weakness    Psychiatric:  no anxiety, no depression    Endocrine:  no heat intolerance, no thyroid problems    Hematologic/Lymphatic:  no anemia, no unusual bleeding    Allergic/Immunologic:  no hives, no seasonal allergies    Examination:  Visual Acuity:   Distance VA [place]:  OD: 20/100 ph NI IOP:  OD:  18    @ 03:55PM (Goldmann applanation) comments: Will change glasses after cataract surgery od.  Confrontation visual field: OU:  Normal  Motility: OU:  Normal  Pupils:  OU:  Shape, size, direct and consensual reaction normal  Adnexa:  Preauricular LN, lacrimal drainage, lacrimal glands, orbit normal  Eyelids: Eyelids:  normal Conjunctiva: OD:  Injected  OS:  bulbar, palpebral normal  Cornea:  OD:  spk OS:  epithelium, stroma, endothelium, tear film normal  Anterior Chamber:  OD:  3+ deep, trace flare OS:  depth normal, no cell, no flare, 3+ deep / clear  Iris: OU:  normal, rubeosis absent Dilation:  OU: AK-Dilate, tropicamide @ 04:12PM  Lens: OU:  PC IOL in good position  Vitreous:  OD:  3+, vitreous hemorrhage,    2nd order  vessels visible OS:  normal  Optic Disc:  OD:  cupping: 0.65 V x 0.6 H  OS:  cupping: 0.85 V x 0.8 H  Macula: OU:  normal   Vessels: OU:  PDR  Periphery: OD:  NVE OS:  Diffuse laser burns; No new H or E; Disc pale white, post partial PRP  Orientation to person, place and time:  Normal  Mood and affect:  Normal  Impression:  362.02  Proliferative Diabetic Retinopathy OU 379.23  -Vitreous Hemorrhage OD, increased v43.10  Pseudophakia OU -  -post Phaco IOL OD x 04/04/2013 365.11  Primary Open Angle Glaucoma OU: contolled 365.72  Glaucoma, moderate  250.50  Diabetes, type II, with ophthalmic manifestations (not  stated as controlled) Plan/Treatment:  His vitreous hemorrhage today is too dense for PRP. I have recommended proceeding with Vitrectomy, Membrane Peeling and Endolaser OD x 09/04/2013. He indicated understanding our discussion and felt that his questions had been answered to his satisfaction.  He indicates that he desires to proceed with Vitrectomy and Endolaser OD.   Medications:   Continue Xalatan OU qhs Patient Instructions: Please do not eat anything after mignight the day before surgery. Return to clinic:  November 20th, 2014 for post-operative follow-up  Schedule:  Pars Plana, Vitrectomy, Membrane Peeling, Endolaser OD x 09/04/2013.    (electronically signed)  Adonis Brook, MD

## 2013-09-04 NOTE — Transfer of Care (Signed)
Immediate Anesthesia Transfer of Care Note  Patient: [name]  Procedure(s) Performed: Procedure(s) with comments: PARS PLANA VITRECTOMY WITH 23 GAUGE (Right) MEMBRANE PEEL (Right) PHOTOCOAGULATION WITH LASER (Right) - ENDOLASER  Patient Location: PACU  Anesthesia Type:MAC  Level of Consciousness: awake, alert , oriented and sedated  Airway & Oxygen Therapy: Patient Spontanous Breathing  Post-op Assessment: Report given to PACU RN, Post -op Vital signs reviewed and stable and Patient moving all extremities  Post vital signs: Reviewed and stable  Complications: No apparent anesthesia complications

## 2013-09-04 NOTE — Op Note (Signed)
JEMALE OMLOR 09/04/2013 Diagnosis: Proliferative Diabetic Retinopathy   Procedure: Pars Plana Vitrectomy, Membrane Peeling and Endolaser Operative Eye:  right eye  Surgeon: Adonis Brook Estimated Blood Loss: minimal Specimens for Pathology:  None Complications: none   The  patient was prepped and draped in the usual fashion for ocular surgery on the  right eye .  A solid lid speculum was placed. The conjunctiva was displaced with a cotton tipped applicator at the  [id number]  meridian. A trocar/cannula was placed 3.5 mm from the surgical limbus. The cannula was visualized in the vitreous cavity. The infusion line was allowed to run and then clamped when placed at the cannula opening. The line was inserted and secured to the drape with an adhesive strip. Trocar/Cannulas were then placed at the 9:30 and 2:30 meridian. The light pipe and vitreous cutter were inserted into the vitreous cavity and the wide field lens was placed. The patient was noted to have vitreous hemorrhage which was 3+ dense and NVE emanating from the superior nasal area of the retina with retinal traction noted. Core vitrectomy was carried out uneventfully and the fibrovascular tissue was delaminated with the vitreous cutter.  Care was taken to remove the vitreous up to the vitreous base for 360 degrees.   Full Panretinal Photocoagulation was then applied using a curved Alcon cannula. The was performed without incident.  The cannulas were removed from the 9:30 and 2:30 positions with concommitant tamponade using a cotton tipped applicator. Subconjunctival injections of Ancef 100mg /0.20ml and Dexamethasone 4mg /62ml were placed in the infero-medial quadrant to avoid proximity to the cannula sites. The tip of the needle was visualized during injection to insure that there was no globe penetration. The infusion was turned down to 67mmhg.  The infusion cannula was removed with concomitant tamponade with the cotton tipped applicator  leaving the ocular pressure less than 20 by palpation.  The speculum and drapes were removed and the eye was patched with Polymixin/Bacitracin ophthalmic ointment. An eye shield was placed and the patient was transferred alert and conversant with stable vital signs to the post operative recovery area.  Adonis Brook MD

## 2013-09-04 NOTE — Anesthesia Preprocedure Evaluation (Signed)
Anesthesia Evaluation  Patient identified by MRN, date of birth, ID band Patient awake    Reviewed: Allergy & Precautions, H&P , NPO status , Patient's Chart, lab work & pertinent test results  History of Anesthesia Complications Negative for: history of anesthetic complications  Airway Mallampati: I TM Distance: >3 FB Neck ROM: Full    Dental   Pulmonary neg pulmonary ROS, former smoker,  breath sounds clear to auscultation        Cardiovascular hypertension, Rhythm:Regular Rate:Normal     Neuro/Psych    GI/Hepatic negative GI ROS, Neg liver ROS,   Endo/Other  diabetes  Renal/GU ESRF and DialysisRenal disease     Musculoskeletal negative musculoskeletal ROS (+)   Abdominal   Peds  Hematology negative hematology ROS (+)   Anesthesia Other Findings   Reproductive/Obstetrics                           Anesthesia Physical Anesthesia Plan  ASA: III  Anesthesia Plan: MAC   Post-op Pain Management:    Induction: Intravenous  Airway Management Planned: Nasal Cannula  Additional Equipment:   Intra-op Plan:   Post-operative Plan:   Informed Consent: I have reviewed the patients History and Physical, chart, labs and discussed the procedure including the risks, benefits and alternatives for the proposed anesthesia with the patient or authorized representative who has indicated his/her understanding and acceptance.     Plan Discussed with: CRNA and Surgeon  Anesthesia Plan Comments:         Anesthesia Quick Evaluation

## 2013-09-06 ENCOUNTER — Encounter (HOSPITAL_COMMUNITY): Payer: Self-pay | Admitting: Ophthalmology

## 2013-12-26 ENCOUNTER — Ambulatory Visit: Payer: Self-pay | Admitting: Vascular Surgery

## 2014-01-09 ENCOUNTER — Ambulatory Visit: Payer: Self-pay | Admitting: Vascular Surgery

## 2014-04-24 ENCOUNTER — Emergency Department (HOSPITAL_COMMUNITY)
Admission: EM | Admit: 2014-04-24 | Discharge: 2014-04-24 | Disposition: A | Discharge status: Home / Self Care | Payer: Medicare Other | Attending: Emergency Medicine | Admitting: Emergency Medicine

## 2014-04-24 ENCOUNTER — Emergency Department (HOSPITAL_COMMUNITY): Payer: Medicare Other

## 2014-04-24 ENCOUNTER — Encounter (HOSPITAL_COMMUNITY): Payer: Self-pay | Admitting: Emergency Medicine

## 2014-04-24 DIAGNOSIS — Z992 Dependence on renal dialysis: Secondary | ICD-10-CM | POA: Insufficient documentation

## 2014-04-24 DIAGNOSIS — I959 Hypotension, unspecified: Secondary | ICD-10-CM | POA: Insufficient documentation

## 2014-04-24 DIAGNOSIS — I120 Hypertensive chronic kidney disease with stage 5 chronic kidney disease or end stage renal disease: Secondary | ICD-10-CM | POA: Insufficient documentation

## 2014-04-24 DIAGNOSIS — E119 Type 2 diabetes mellitus without complications: Secondary | ICD-10-CM | POA: Insufficient documentation

## 2014-04-24 DIAGNOSIS — N186 End stage renal disease: Secondary | ICD-10-CM | POA: Insufficient documentation

## 2014-04-24 DIAGNOSIS — M1610 Unilateral primary osteoarthritis, unspecified hip: Secondary | ICD-10-CM | POA: Insufficient documentation

## 2014-04-24 DIAGNOSIS — Z79899 Other long term (current) drug therapy: Secondary | ICD-10-CM | POA: Insufficient documentation

## 2014-04-24 DIAGNOSIS — N400 Enlarged prostate without lower urinary tract symptoms: Secondary | ICD-10-CM | POA: Insufficient documentation

## 2014-04-24 DIAGNOSIS — Z862 Personal history of diseases of the blood and blood-forming organs and certain disorders involving the immune mechanism: Secondary | ICD-10-CM | POA: Insufficient documentation

## 2014-04-24 DIAGNOSIS — Z91040 Latex allergy status: Secondary | ICD-10-CM | POA: Insufficient documentation

## 2014-04-24 DIAGNOSIS — Z87891 Personal history of nicotine dependence: Secondary | ICD-10-CM | POA: Insufficient documentation

## 2014-04-24 DIAGNOSIS — Z9889 Other specified postprocedural states: Secondary | ICD-10-CM | POA: Insufficient documentation

## 2014-04-24 LAB — COMPREHENSIVE METABOLIC PANEL
ALT: 10 U/L (ref 0–53)
ANION GAP: 19 — AB (ref 5–15)
AST: 9 U/L (ref 0–37)
Albumin: 3.6 g/dL (ref 3.5–5.2)
Alkaline Phosphatase: 82 U/L (ref 39–117)
BUN: 32 mg/dL — AB (ref 6–23)
CALCIUM: 10 mg/dL (ref 8.4–10.5)
CO2: 26 mEq/L (ref 19–32)
CREATININE: 11.67 mg/dL — AB (ref 0.50–1.35)
Chloride: 92 mEq/L — ABNORMAL LOW (ref 96–112)
GFR calc non Af Amer: 4 mL/min — ABNORMAL LOW (ref >90)
GFR, EST AFRICAN AMERICAN: 5 mL/min — AB (ref >90)
GLUCOSE: 217 mg/dL — AB (ref 70–99)
Potassium: 5.4 mEq/L — ABNORMAL HIGH (ref 3.7–5.3)
Sodium: 137 mEq/L (ref 137–147)
TOTAL PROTEIN: 7.9 g/dL (ref 6.0–8.3)
Total Bilirubin: 0.2 mg/dL — ABNORMAL LOW (ref 0.3–1.2)

## 2014-04-24 LAB — CBC WITH DIFFERENTIAL/PLATELET
Basophils Absolute: 0 10*3/uL (ref 0.0–0.1)
Basophils Relative: 0 % (ref 0–1)
EOS ABS: 0.2 10*3/uL (ref 0.0–0.7)
EOS PCT: 3 % (ref 0–5)
HCT: 40.8 % (ref 39.0–52.0)
HEMOGLOBIN: 12.8 g/dL — AB (ref 13.0–17.0)
LYMPHS ABS: 1.4 10*3/uL (ref 0.7–4.0)
Lymphocytes Relative: 19 % (ref 12–46)
MCH: 29.2 pg (ref 26.0–34.0)
MCHC: 31.4 g/dL (ref 30.0–36.0)
MCV: 92.9 fL (ref 78.0–100.0)
MONO ABS: 0.7 10*3/uL (ref 0.1–1.0)
MONOS PCT: 10 % (ref 3–12)
Neutro Abs: 5.3 10*3/uL (ref 1.7–7.7)
Neutrophils Relative %: 68 % (ref 43–77)
Platelets: 215 10*3/uL (ref 150–400)
RBC: 4.39 MIL/uL (ref 4.22–5.81)
RDW: 13.8 % (ref 11.5–15.5)
WBC: 7.6 10*3/uL (ref 4.0–10.5)

## 2014-04-24 LAB — I-STAT TROPONIN, ED: TROPONIN I, POC: 0 ng/mL (ref 0.00–0.08)

## 2014-04-24 NOTE — ED Notes (Signed)
Pt wc to lobby. Denies complaints at this time. Respirations easy non labored. Skin warm and dry.

## 2014-04-24 NOTE — ED Notes (Addendum)
Pt at pmd for routine check up had episode of hypotension. Pt c/o feeling hot, and weak. Symptoms resolved after snack. Pt denies cp or sob. cbg 200. Pt denies complaints at this time. Alert x4 respirations easy non labored. Dialysis pt, due tomorrow.  Arrived via [facility]

## 2014-04-24 NOTE — ED Provider Notes (Signed)
Medical screening examination/treatment/procedure(s) were performed by non-physician practitioner and as supervising physician I was immediately available for consultation/collaboration.   EKG Interpretation   Date/Time:  Thursday April 24 2014 16:22:57 EDT Ventricular Rate:  55 PR Interval:  190 QRS Duration: 92 QT Interval:  448 QTC Calculation: 428 R Axis:   18 Text Interpretation:  Sinus rhythm Low voltage, extremity and precordial  leads No significant change since last tracing Confirmed by Lasheba Stevens  MD-J,  Doris Gruhn [id number]) on 04/24/2014 6:49:55 PM      Pt had a presyncopal episode at his doctors office.  Has been feeling well since.  Not orthostatic.  Feels well enough to go home.  Will see his dialysis providers tomorrow for close follow up.  At this time there does not appear to be any evidence of an acute emergency medical condition and the patient appears stable for discharge with appropriate outpatient follow up.   Dorie Rank, MD 04/24/14 [phone]

## 2014-04-24 NOTE — Discharge Instructions (Signed)
Keep up with your meals and fluids. Follow up tomorrow for dialysis. Return if any problems.    Orthostatic Hypotension Orthostatic hypotension is a sudden drop in blood pressure. It happens when you quickly stand up from a seated or lying position. You may feel dizzy or light-headed. This can last for just a few seconds or for up to a few minutes. It is usually not a serious problem. However, if this happens frequently or gets worse, it can be a sign of something more serious. CAUSES  Different things can cause orthostatic hypotension, including:   Loss of body fluids (dehydration).  Medicines that lower blood pressure.  Sudden changes in posture, such as standing up quickly after you have been sitting or lying down.  Taking too much of your medicine. SIGNS AND SYMPTOMS   Light-headedness or dizziness.   Fainting or near-fainting.   A fast heart rate.   Weakness.   Feeling tired (fatigue).  DIAGNOSIS  Your health care provider may do several things to help diagnose your condition and identify the cause. These may include:   Taking a medical history and doing a physical exam.  Checking your blood pressure. Your health care provider will check your blood pressure when you are:  Lying down.  Sitting.  Standing.  Using tilt table testing. In this test, you lie down on a table that moves from a lying position to a standing position. You will be strapped onto the table. This test monitors your blood pressure and heart rate when you are in different positions. TREATMENT  Treatment will vary depending on the cause. Possible treatments include:   Changing the dosage of your medicines.  Wearing compression stockings on your lower legs.  Standing up slowly after sitting or lying down.  Eating more salt.  Eating frequent, small meals.  In some cases, getting IV fluids.  Taking medicine to enhance fluid retention. HOME CARE INSTRUCTIONS  Only take over-the-counter  or prescription medicines as directed by your health care provider.  Follow your health care provider's instructions for changing the dosage of your current medicines.  Do not stop or adjust your medicine on your own.  Stand up slowly after sitting or lying down. This allows your body to adjust to the different position.  Wear compression stockings as directed.  Eat extra salt as directed.  Do not add extra salt to your diet unless directed to by your health care provider.  Eat frequent, small meals.  Avoid standing suddenly after eating.  Avoid hot showers or excessive heat as directed by your health care provider.  Keep all follow-up appointments. SEEK MEDICAL CARE IF:  You continue to feel dizzy or light-headed after standing.  You feel groggy or confused.  You feel cold, clammy, or sick to your stomach (nauseous).  You have blurred vision.  You feel short of breath. SEEK IMMEDIATE MEDICAL CARE IF:   You faint after standing.  You have chest pain.  You have difficulty breathing.   You lose feeling or movement in your arms or legs.   You have slurred speech or difficulty talking, or you are unable to talk.  MAKE SURE YOU:   Understand these instructions.  Will watch your condition.  Will get help right away if you are not doing well or get worse. Document Released: 09/23/2002 Document Revised: 10/08/2013 Document Reviewed: 07/26/2013 [facility] Patient Information 2015 [place], [place]. This information is not intended to replace advice given to you by your health care provider. Make sure  you discuss any questions you have with your health care provider.

## 2014-04-24 NOTE — ED Provider Notes (Signed)
CSN: [id number]     Arrival date & time 04/24/14  1613 History   First MD Initiated Contact with Patient 04/24/14 1614     Chief Complaint  Patient presents with  . Hypotension     (Consider location/radiation/quality/duration/timing/severity/associated sxs/prior Treatment) HPI [name] is a 68 y.o. male with hx of ESRD on dialysis, htn, diabetes, who presents to ED with complaint of near syncopal episode and hypotention. Pt states he had a routine apt with PCP today. He states he has been having some sinus pressure and left ear pain for several days now. States while waiting at his PCP's office he became dizzy, hot, diaphoretic, states felt like going to faint. Reports his PCP layed him down, raised his legs, and brought him some water which helped. At that BP was 80s/40s. States after laying down for some time he felt better. He was transported here for further evaluation. Pt is currently symptom free. States he is diabetic on oral mediations, took his meds today, did not eat lunch. States he had a snack at [employer] office as well. Pt denies any CP or SOB. No swelling in extremities. Denies dehydration. Last dialysis yesterday.   Past Medical History  Diagnosis Date  . Type II or unspecified type diabetes mellitus without mention of complication, not stated as uncontrolled   . Essential hypertension, malignant   . Hypertrophy of prostate without urinary obstruction and other lower urinary tract symptoms (LUTS)   . Pure hypercholesterolemia   . Arthritis     right hip   . Anemia     hx of at beginnning of dialysis- 05/2009  . End stage renal disease     [place]   . Renal insufficiency    Past Surgical History  Procedure Laterality Date  . Cervical discectomy  03/1999  . Dialysis fistula creation  2010  . Eye surgery  2012    cataracts  . Cystoscopy with retrograde pyelogram, ureteroscopy and stent placement  11/01/2012    Procedure: CYSTOSCOPY WITH  RETROGRADE PYELOGRAM, URETEROSCOPY AND STENT PLACEMENT;  Surgeon: Molli Hazard, MD;  Location: [facility];  Service: Urology;  Laterality: Bilateral;  . Holmium laser application  [id number]    Procedure: HOLMIUM LASER APPLICATION;  Surgeon: Molli Hazard, MD;  Location: [facility];  Service: Urology;  Laterality: Bilateral;  . Cystoscopy with biopsy  11/01/2012    Procedure: CYSTOSCOPY WITH BIOPSY;  Surgeon: Molli Hazard, MD;  Location: [facility];  Service: Urology;  Laterality: N/A;  with fulgeration  . Neck surgery  1990  . Cardiac catheterization  04/29/10    normal coronaries, LVEF 45%, mild global LV hypokinesis ([place])  . Transthoracic echocardiogram  06/19/2012    normal LV sys function, EF 55-60%, mild to mod diastolic dysfunction, mild AI ([place])  . Cataract extraction w/phaco Right 04/03/2013    Procedure: CATARACT EXTRACTION PHACO AND INTRAOCULAR LENS PLACEMENT (IOC);  Surgeon: Adonis Brook, MD;  Location: [place];  Service: Ophthalmology;  Laterality: Right;  . Colonoscopy      Hx: of  . Pars plana vitrectomy Right 09/04/2013    Procedure: PARS PLANA VITRECTOMY WITH 23 GAUGE;  Surgeon: Adonis Brook, MD;  Location: [place];  Service: Ophthalmology;  Laterality: Right;  . Membrane peel Right 09/04/2013    Procedure: MEMBRANE PEEL;  Surgeon: Adonis Brook, MD;  Location: [place];  Service: Ophthalmology;  Laterality: Right;  . Photocoagulation with laser Right 09/04/2013    Procedure: PHOTOCOAGULATION WITH  LASER;  Surgeon: Adonis Brook, MD;  Location: [place];  Service: Ophthalmology;  Laterality: Right;  ENDOLASER   Family History  Problem Relation Age of Onset  . Hypertension Brother   . Diabetes Mellitus II Brother   . Breast cancer Mother 103    deceased  . Pneumonia Father 48    deceased   History  Substance Use Topics  . Smoking status: Former Smoker    Types: Cigarettes    Quit date: 10/02/1990  . Smokeless tobacco: Never Used  .  Alcohol Use: Yes     Comment: occasional    Review of Systems  Constitutional: Negative for fever and chills.  Respiratory: Negative for cough, chest tightness and shortness of breath.   Cardiovascular: Negative for chest pain, palpitations and leg swelling.  Gastrointestinal: Negative for nausea, vomiting, abdominal pain, diarrhea and abdominal distention.  Genitourinary: Negative for dysuria, urgency, frequency and hematuria.  Musculoskeletal: Negative for arthralgias, myalgias, neck pain and neck stiffness.  Skin: Negative for rash.  Allergic/Immunologic: Negative for immunocompromised state.  Neurological: Positive for dizziness and light-headedness. Negative for syncope, weakness, numbness and headaches.      Allergies  Codeine; Doxycycline; and Latex  Home Medications   Prior to Admission medications   Medication Sig Start Date End Date Taking? Authorizing Provider  acetaminophen (TYLENOL) 325 MG tablet Take 650 mg by mouth every 6 (six) hours as needed for mild pain.   Yes Historical Provider, MD  allopurinol (ZYLOPRIM) 300 MG tablet Take 300 mg by mouth daily.    Yes Historical Provider, MD  amLODipine (NORVASC) 5 MG tablet Take 5 mg by mouth every morning.    Yes Historical Provider, MD  brimonidine-timolol (COMBIGAN) 0.2-0.5 % ophthalmic solution Place 1 drop into both eyes every 12 (twelve) hours.    Yes Historical Provider, MD  cinacalcet (SENSIPAR) 90 MG tablet Take 90 mg by mouth daily before supper.    Yes Historical Provider, MD  doxazosin (CARDURA) 8 MG tablet Take 8 mg by mouth daily.    Yes Historical Provider, MD  glimepiride (AMARYL) 4 MG tablet Take 4 mg by mouth daily before breakfast.   Yes Historical Provider, MD  lanthanum (FOSRENOL) 500 MG chewable tablet Chew 1,000 mg by mouth 3 (three) times daily with meals. Takes only with snacks   Yes Historical Provider, MD  latanoprost (XALATAN) 0.005 % ophthalmic solution Place 1 drop into both eyes at bedtime.     Yes Historical Provider, MD  linagliptin (TRADJENTA) 5 MG TABS tablet Take 5 mg by mouth every morning.    Yes Historical Provider, MD  metoprolol (LOPRESSOR) 50 MG tablet Take 25 mg by mouth 2 (two) times daily.   Yes Historical Provider, MD  Multiple Vitamin (MULTIVITAMIN WITH MINERALS) TABS Take 1 tablet by mouth daily.   Yes Historical Provider, MD  mupirocin cream (BACTROBAN) 2 % Apply 1 application topically daily as needed (for breakouts).    Yes Historical Provider, MD  OVER THE COUNTER MEDICATION Place 2 drops into both ears as needed (for ear ache). "OTC Ear Pain Relief"   Yes Historical Provider, MD  sevelamer (RENVELA) 800 MG tablet Take 1,600 mg by mouth 3 (three) times daily with meals. Take with meals and snacks   Yes Historical Provider, MD  sodium chloride (OCEAN) 0.65 % SOLN nasal spray Place 2 sprays into left nostril as needed for congestion.   Yes Historical Provider, MD   BP 115/64  Pulse 57  Temp(Src) 97.8 F (36.6 C) (Oral)  Resp 21  Ht 6' (1.829 m)  Wt 204 lb (92.534 kg)  BMI 27.66 kg/m2  SpO2 100% Physical Exam  Nursing note and vitals reviewed. Constitutional: He is oriented to person, place, and time. He appears well-developed and well-nourished. No distress.  HENT:  Head: Normocephalic and atraumatic.  Right Ear: External ear normal.  Left Ear: External ear normal.  Nose: Nose normal.  Mouth/Throat: Oropharynx is clear and moist.  Left maxillary sinus tenderness, TMs normal bilaterally  Eyes: Conjunctivae are normal.  Neck: Neck supple.  Cardiovascular: Normal rate, regular rhythm and normal heart sounds.   Pulmonary/Chest: Effort normal. No respiratory distress. He has no wheezes. He has no rales.  Abdominal: Soft. Bowel sounds are normal. He exhibits no distension. There is no tenderness. There is no rebound.  Musculoskeletal: He exhibits no edema.  Neurological: He is alert and oriented to person, place, and time.  Skin: Skin is warm and dry.     ED Course  Procedures (including critical care time) Labs Review Labs Reviewed  CBC WITH DIFFERENTIAL - Abnormal; Notable for the following:    Hemoglobin 12.8 (*)    All other components within normal limits  COMPREHENSIVE METABOLIC PANEL - Abnormal; Notable for the following:    Potassium 5.4 (*)    Chloride 92 (*)    Glucose, Bld 217 (*)    BUN 32 (*)    Creatinine, Ser 11.67 (*)    Total Bilirubin 0.2 (*)    GFR calc non Af Amer 4 (*)    GFR calc Af Amer 5 (*)    Anion gap 19 (*)    All other components within normal limits  I-STAT TROPOININ, ED    Imaging Review Dg Chest 2 View  04/24/2014   CLINICAL DATA:  Hypotension.  EXAM: CHEST  2 VIEW  COMPARISON:  None.  FINDINGS: The heart size and mediastinal contours are within normal limits. Both lungs are clear. No evidence of pneumothorax or pleural effusion. Thoracic spine degenerative changes noted. Cervical spine fusion hardware again seen.  IMPRESSION: No active cardiopulmonary disease.   Electronically Signed   By: Earle Gell M.D.   On: 04/24/2014 18:12     EKG Interpretation   Date/Time:  Thursday April 24 2014 16:22:57 EDT Ventricular Rate:  55 PR Interval:  190 QRS Duration: 92 QT Interval:  448 QTC Calculation: 428 R Axis:   18 Text Interpretation:  Sinus rhythm Low voltage, extremity and precordial  leads No significant change since last tracing Confirmed by KNAPP  MD-J,  JON [id number]) on 04/24/2014 6:49:55 PM      MDM   Final diagnoses:  Hypotension, unspecified hypotension type    Patient with history of dialysis here from primary care Dr. office where he had an episode of hypotension associated with dizziness and diaphoresis. Patient denies any chest pain or shortness of breath. He is currently feeling back to normal, blood pressure is normal here. Will check EKG, lab work, chest x-ray.   Chest x-ray unremarkable, potassium slightly elevated, patient will have dialysis tomorrow morning. Glucose 217,  and he was given some sweet snacks on the way here. Patient is asymptomatic, and wants to be discharged home. Blood pressure is back to normal. He will have dialysis tomorrow morning. He'll return if symptoms are worsening. Concern for a PE, coronary disease, most likely orthostatic hypotension.  Filed Vitals:   04/24/14 1715 04/24/14 1730 04/24/14 1745 04/24/14 1906  BP: 115/64 107/68 113/64 104/57  Pulse: 57 62 59  61  Temp:      TempSrc:      Resp: 21 20 14    Height:      Weight:      SpO2: 100% 100% 100% 98%       Renold Genta, PA-C 04/24/14 1933

## 2014-07-02 ENCOUNTER — Ambulatory Visit: Payer: Self-pay | Admitting: Vascular Surgery

## 2014-10-16 ENCOUNTER — Ambulatory Visit: Payer: Self-pay | Admitting: Vascular Surgery

## 2014-10-17 HISTORY — PX: KIDNEY TRANSPLANT: SHX239

## 2014-10-30 ENCOUNTER — Encounter (HOSPITAL_COMMUNITY): Payer: Self-pay | Admitting: Urology

## 2014-10-30 ENCOUNTER — Encounter (INDEPENDENT_AMBULATORY_CARE_PROVIDER_SITE_OTHER): Payer: Self-pay | Admitting: Ophthalmology

## 2014-11-17 HISTORY — PX: EYE SURGERY: SHX253

## 2014-11-18 ENCOUNTER — Encounter (INDEPENDENT_AMBULATORY_CARE_PROVIDER_SITE_OTHER): Payer: Self-pay | Admitting: Ophthalmology

## 2014-11-25 ENCOUNTER — Encounter (INDEPENDENT_AMBULATORY_CARE_PROVIDER_SITE_OTHER): Payer: Self-pay | Admitting: Ophthalmology

## 2014-12-11 ENCOUNTER — Encounter (HOSPITAL_COMMUNITY): Payer: Self-pay | Admitting: Family Medicine

## 2014-12-11 ENCOUNTER — Emergency Department (INDEPENDENT_AMBULATORY_CARE_PROVIDER_SITE_OTHER)
Admission: EM | Admit: 2014-12-11 | Discharge: 2014-12-11 | Disposition: A | Discharge status: Home / Self Care | Payer: Medicare Other | Source: Home / Self Care | Attending: Family Medicine | Admitting: Family Medicine

## 2014-12-11 DIAGNOSIS — L0291 Cutaneous abscess, unspecified: Secondary | ICD-10-CM

## 2014-12-11 DIAGNOSIS — L739 Follicular disorder, unspecified: Secondary | ICD-10-CM

## 2014-12-11 HISTORY — DX: End stage renal disease: N18.6

## 2014-12-11 HISTORY — DX: Dependence on renal dialysis: Z99.2

## 2014-12-11 MED ORDER — SULFAMETHOXAZOLE-TRIMETHOPRIM 400-80 MG PO TABS
1.0000 | ORAL_TABLET | Freq: Two times a day (BID) | ORAL | Status: DC
Start: 2014-12-11 — End: 2018-07-03

## 2014-12-11 NOTE — ED Provider Notes (Signed)
CSN: [id number]     Arrival date & time 12/11/14  1155 History   First MD Initiated Contact with Patient 12/11/14 1251     No chief complaint on file.  (Consider location/radiation/quality/duration/timing/severity/associated sxs/prior Treatment) HPI  R arm sore. Started 2 wks ago. No injury to arm and didn't actually see anything bite his arm. Sore getting bigger and more painful. Purulent discharge started yesterday. H2O2 adn vasaline w/ some improvement. Getting worse. Constant.   Past Medical History  Diagnosis Date  . Type II or unspecified type diabetes mellitus without mention of complication, not stated as uncontrolled   . Essential hypertension, malignant   . Hypertrophy of prostate without urinary obstruction and other lower urinary tract symptoms (LUTS)   . Pure hypercholesterolemia   . Arthritis     right hip   . Anemia     hx of at beginnning of dialysis- 05/2009  . End stage renal disease     [place] [facility]   . Renal insufficiency   . End stage renal disease on dialysis    Past Surgical History  Procedure Laterality Date  . Cervical discectomy  03/1999  . Dialysis fistula creation  2010  . Eye surgery  2012    cataracts  . Cystoscopy with retrograde pyelogram, ureteroscopy and stent placement  11/01/2012    Procedure: CYSTOSCOPY WITH RETROGRADE PYELOGRAM, URETEROSCOPY AND STENT PLACEMENT;  Surgeon: Molli Hazard, MD;  [facility];  Service: Urology;  Laterality: Bilateral;  . Holmium laser application  [id number]    Procedure: HOLMIUM LASER APPLICATION;  Surgeon: Molli Hazard, MD;  [facility];  Service: Urology;  Laterality: Bilateral;  . Cystoscopy with biopsy  11/01/2012    Procedure: CYSTOSCOPY WITH BIOPSY;  Surgeon: Molli Hazard, MD;  [facility];  Service: Urology;  Laterality: N/A;  with fulgeration  . Neck surgery  1990  . Cardiac catheterization  04/29/10    normal coronaries, LVEF 45%, mild  global LV hypokinesis ([place])  . Transthoracic echocardiogram  06/19/2012    normal LV sys function, EF 55-60%, mild to mod diastolic dysfunction, mild AI ([place])  . Cataract extraction w/phaco Right 04/03/2013    Procedure: CATARACT EXTRACTION PHACO AND INTRAOCULAR LENS PLACEMENT (IOC);  Surgeon: Adonis Brook, MD;  Location: [place];  Service: Ophthalmology;  Laterality: Right;  . Colonoscopy      Hx: of  . Pars plana vitrectomy Right 09/04/2013    Procedure: PARS PLANA VITRECTOMY WITH 23 GAUGE;  Surgeon: Adonis Brook, MD;  Location: [place];  Service: Ophthalmology;  Laterality: Right;  . Membrane peel Right 09/04/2013    Procedure: MEMBRANE PEEL;  Surgeon: Adonis Brook, MD;  Location: [place];  Service: Ophthalmology;  Laterality: Right;  . Photocoagulation with laser Right 09/04/2013    Procedure: PHOTOCOAGULATION WITH LASER;  Surgeon: Adonis Brook, MD;  Location: [place];  Service: Ophthalmology;  Laterality: Right;  ENDOLASER   Family History  Problem Relation Age of Onset  . Hypertension Brother   . Diabetes Mellitus II Brother   . Breast cancer Mother 23    deceased  . Pneumonia Father 74    deceased   History  Substance Use Topics  . Smoking status: Former Smoker    Types: Cigarettes    Quit date: 10/02/1990  . Smokeless tobacco: Never Used  . Alcohol Use: Yes     Comment: occasional    Review of Systems Per HPI with all other pertinent systems negative.  Allergies  Codeine; Doxycycline; and Latex  Home Medications   Prior to Admission medications   Medication Sig Start Date End Date Taking? Authorizing Provider  acetaminophen (TYLENOL) 325 MG tablet Take 650 mg by mouth every 6 (six) hours as needed for mild pain.    Historical Provider, MD  allopurinol (ZYLOPRIM) 300 MG tablet Take 300 mg by mouth daily.     Historical Provider, MD  amLODipine (NORVASC) 5 MG tablet Take 5 mg by mouth every morning.     Historical Provider, MD   brimonidine-timolol (COMBIGAN) 0.2-0.5 % ophthalmic solution Place 1 drop into both eyes every 12 (twelve) hours.     Historical Provider, MD  cinacalcet (SENSIPAR) 90 MG tablet Take 90 mg by mouth daily before supper.     Historical Provider, MD  doxazosin (CARDURA) 8 MG tablet Take 8 mg by mouth daily.     Historical Provider, MD  glimepiride (AMARYL) 4 MG tablet Take 4 mg by mouth daily before breakfast.    Historical Provider, MD  lanthanum (FOSRENOL) 500 MG chewable tablet Chew 1,000 mg by mouth 3 (three) times daily with meals. Takes only with snacks    Historical Provider, MD  latanoprost (XALATAN) 0.005 % ophthalmic solution Place 1 drop into both eyes at bedtime.     Historical Provider, MD  linagliptin (TRADJENTA) 5 MG TABS tablet Take 5 mg by mouth every morning.     Historical Provider, MD  metoprolol (LOPRESSOR) 50 MG tablet Take 25 mg by mouth 2 (two) times daily.    Historical Provider, MD  Multiple Vitamin (MULTIVITAMIN WITH MINERALS) TABS Take 1 tablet by mouth daily.    Historical Provider, MD  mupirocin cream (BACTROBAN) 2 % Apply 1 application topically daily as needed (for breakouts).     Historical Provider, MD  OVER THE COUNTER MEDICATION Place 2 drops into both ears as needed (for ear ache). "OTC Ear Pain Relief"    Historical Provider, MD  sevelamer (RENVELA) 800 MG tablet Take 1,600 mg by mouth 3 (three) times daily with meals. Take with meals and snacks    Historical Provider, MD  sodium chloride (OCEAN) 0.65 % SOLN nasal spray Place 2 sprays into left nostril as needed for congestion.    Historical Provider, MD  sulfamethoxazole-trimethoprim (BACTRIM) 400-80 MG per tablet Take 1 tablet by mouth 2 (two) times daily. 12/11/14   Waldemar Dickens, MD   There were no vitals taken for this visit. Physical Exam  Constitutional: He appears well-developed and well-nourished.  HENT:  Head: Normocephalic and atraumatic.  Eyes: EOM are normal. Pupils are equal, round, and reactive  to light.  Neck: Normal range of motion.  Cardiovascular: Normal rate, normal heart sounds and intact distal pulses.   Right vascular fistula no longer patent. Left vascular fistula with thrill and bruit.  Pulmonary/Chest: Effort normal and breath sounds normal. No respiratory distress.  Abdominal: Soft. Bowel sounds are normal.  Musculoskeletal: Normal range of motion. He exhibits no edema.  Skin:  Right forearm 1 x 1 cm lesion with central fluctuance. Minimal induration  Psychiatric: He has a normal mood and affect. Thought content normal.    ED Course  INCISION AND DRAINAGE Date/Time: 12/11/2014 1:36 PM Performed by: Marily Memos, Maysin Carstens J Authorized by: Marily Memos, Karissa Meenan J Consent: Verbal consent obtained. Consent given by: patient Patient identity confirmed: verbally with patient Type: abscess Location: R forearm. Anesthesia method: Ethyl chloride spray. Scalpel size: 11 Incision type: single straight Complexity: complex Drainage: purulent Drainage amount: scant Wound  treatment: wound left open   (including critical care time) Labs Review Labs Reviewed - No data to display  Imaging Review No results found.   MDM   1. Abscess   2. Folliculitis     Abscess drained as above Given risks of severe complication given dialysis will start pt on Bactrim (renal dose calculated and given 4mg /kg/day).  Continue dialysis Precautions given and all questions answered  Linna Darner, MD Family Medicine 12/11/2014, 1:35 PM      Waldemar Dickens, MD 12/11/14 1336

## 2014-12-11 NOTE — Discharge Instructions (Signed)
You have a skin infection that was opened in our clinic. This will require antibiotics to fully clear. If it is not better by the time your antiobitics are done, please come back for further evaluation.  Please to go dialysis as scheduled.

## 2014-12-11 NOTE — ED Notes (Signed)
Dr Marily Memos placed patient in treatment room

## 2014-12-11 NOTE — ED Notes (Signed)
Abscess to right upper extremety

## 2014-12-25 ENCOUNTER — Encounter (INDEPENDENT_AMBULATORY_CARE_PROVIDER_SITE_OTHER): Payer: Medicare Other | Admitting: Ophthalmology

## 2014-12-25 DIAGNOSIS — I10 Essential (primary) hypertension: Secondary | ICD-10-CM

## 2014-12-25 DIAGNOSIS — E11319 Type 2 diabetes mellitus with unspecified diabetic retinopathy without macular edema: Secondary | ICD-10-CM | POA: Diagnosis not present

## 2014-12-25 DIAGNOSIS — H43813 Vitreous degeneration, bilateral: Secondary | ICD-10-CM | POA: Diagnosis not present

## 2014-12-25 DIAGNOSIS — H2702 Aphakia, left eye: Secondary | ICD-10-CM | POA: Diagnosis not present

## 2014-12-25 DIAGNOSIS — H35033 Hypertensive retinopathy, bilateral: Secondary | ICD-10-CM

## 2015-01-29 ENCOUNTER — Encounter (INDEPENDENT_AMBULATORY_CARE_PROVIDER_SITE_OTHER): Payer: Medicare Other | Admitting: Ophthalmology

## 2015-01-29 DIAGNOSIS — E11319 Type 2 diabetes mellitus with unspecified diabetic retinopathy without macular edema: Secondary | ICD-10-CM

## 2015-01-29 DIAGNOSIS — H59032 Cystoid macular edema following cataract surgery, left eye: Secondary | ICD-10-CM | POA: Diagnosis not present

## 2015-01-29 DIAGNOSIS — H43813 Vitreous degeneration, bilateral: Secondary | ICD-10-CM | POA: Diagnosis not present

## 2015-01-29 DIAGNOSIS — H35033 Hypertensive retinopathy, bilateral: Secondary | ICD-10-CM | POA: Diagnosis not present

## 2015-01-29 DIAGNOSIS — H27139 Posterior dislocation of lens, unspecified eye: Secondary | ICD-10-CM | POA: Diagnosis present

## 2015-01-29 DIAGNOSIS — E11359 Type 2 diabetes mellitus with proliferative diabetic retinopathy without macular edema: Secondary | ICD-10-CM | POA: Diagnosis not present

## 2015-01-29 DIAGNOSIS — I10 Essential (primary) hypertension: Secondary | ICD-10-CM

## 2015-01-29 DIAGNOSIS — H44752 Retained (nonmagnetic) (old) foreign body in vitreous body, left eye: Secondary | ICD-10-CM

## 2015-01-29 DIAGNOSIS — T8522XA Displacement of intraocular lens, initial encounter: Secondary | ICD-10-CM | POA: Diagnosis present

## 2015-01-29 DIAGNOSIS — E113599 Type 2 diabetes mellitus with proliferative diabetic retinopathy without macular edema, unspecified eye: Secondary | ICD-10-CM | POA: Diagnosis present

## 2015-01-29 NOTE — H&P (Signed)
[name] is an 69 y.o. male.   Chief Complaint: sudden loss of vision left eye HPI: Dislocation of intraocular lens into the vitreous left eye  Past Medical History  Diagnosis Date  . Type II or unspecified type diabetes mellitus without mention of complication, not stated as uncontrolled   . Essential hypertension, malignant   . Hypertrophy of prostate without urinary obstruction and other lower urinary tract symptoms (LUTS)   . Pure hypercholesterolemia   . Arthritis     right hip   . Anemia     hx of at beginnning of dialysis- 05/2009  . End stage renal disease     [place] [facility]   . Renal insufficiency   . End stage renal disease on dialysis     Past Surgical History  Procedure Laterality Date  . Cervical discectomy  03/1999  . Dialysis fistula creation  2010  . Eye surgery  2012    cataracts  . Cystoscopy with retrograde pyelogram, ureteroscopy and stent placement  11/01/2012    Procedure: CYSTOSCOPY WITH RETROGRADE PYELOGRAM, URETEROSCOPY AND STENT PLACEMENT;  Surgeon: Molli Hazard, MD;  Location: [facility];  Service: Urology;  Laterality: Bilateral;  . Holmium laser application  [id number]    Procedure: HOLMIUM LASER APPLICATION;  Surgeon: Molli Hazard, MD;  Location: [facility];  Service: Urology;  Laterality: Bilateral;  . Cystoscopy with biopsy  11/01/2012    Procedure: CYSTOSCOPY WITH BIOPSY;  Surgeon: Molli Hazard, MD;  Location: [facility];  Service: Urology;  Laterality: N/A;  with fulgeration  . Neck surgery  1990  . Cardiac catheterization  04/29/10    normal coronaries, LVEF 45%, mild global LV hypokinesis ([place])  . Transthoracic echocardiogram  06/19/2012    normal LV sys function, EF 55-60%, mild to mod diastolic dysfunction, mild AI ([place])  . Cataract extraction w/phaco Right 04/03/2013    Procedure: CATARACT EXTRACTION PHACO AND INTRAOCULAR LENS PLACEMENT (IOC);  Surgeon: Adonis Brook,  MD;  Location: [place];  Service: Ophthalmology;  Laterality: Right;  . Colonoscopy      Hx: of  . Pars plana vitrectomy Right 09/04/2013    Procedure: PARS PLANA VITRECTOMY WITH 23 GAUGE;  Surgeon: Adonis Brook, MD;  Location: [place];  Service: Ophthalmology;  Laterality: Right;  . Membrane peel Right 09/04/2013    Procedure: MEMBRANE PEEL;  Surgeon: Adonis Brook, MD;  Location: [place];  Service: Ophthalmology;  Laterality: Right;  . Photocoagulation with laser Right 09/04/2013    Procedure: PHOTOCOAGULATION WITH LASER;  Surgeon: Adonis Brook, MD;  Location: [place];  Service: Ophthalmology;  Laterality: Right;  ENDOLASER    Family History  Problem Relation Age of Onset  . Hypertension Brother   . Diabetes Mellitus II Brother   . Breast cancer Mother 36    deceased  . Pneumonia Father 57    deceased   Social History:  reports that he quit smoking about 24 years ago. His smoking use included Cigarettes. He has never used smokeless tobacco. He reports that he drinks alcohol. He reports that he does not use illicit drugs.  Allergies:  Allergies  Allergen Reactions  . Codeine Nausea And Vomiting  . Doxycycline Nausea And Vomiting  . Latex Hives    No prescriptions prior to admission    Review of systems otherwise negative  There were no vitals taken for this visit.  Physical exam: Mental status: oriented x3. Eyes: See eye exam associated with this date of surgery  in media tab.  Scanned in by scanning center Ears, Savage, Throat: within normal limits Neck: Within Normal limits General: within normal limits Chest: Within normal limits Breast: deferred Heart: Within normal limits Abdomen: Within normal limits GU: deferred Extremities: within normal limits Skin: within normal limits  Assessment/Plan Dislocated intraocular lens in the vitreous left eye Plan: To [facility] for Pars plana vitrectomy, laser, placement of secondary intraocular lens with suture left  eye  [name], [name] 01/29/2015, 7:06 PM

## 2015-02-06 NOTE — Op Note (Signed)
PATIENT NAME:  [name], [name] MR#:  [id number] DATE OF BIRTH:  Oct 21, 1945  DATE OF PROCEDURE:  05/22/2013  PREOPERATIVE DIAGNOSES:  1.  End-stage renal disease.  2.  Inability to cannulate his right arm arteriovenous fistula after recent intervention.  3.  Hypertension.   POSTOPERATIVE DIAGNOSES:  1.  End-stage renal disease.  2.  Inability to cannulate his right arm arteriovenous fistula after recent intervention.  3.  Hypertension.    PROCEDURE:  1.  Ultrasound guidance for vascular access, right arm arteriovenous fistula.  2.  Right upper extremity fistulogram, central venogram.   SURGEON: Algernon Huxley, M.D.   ANESTHESIA: Local with monitored conscious sedation.   ESTIMATED BLOOD LOSS: Minimal.   FLUOROSCOPY TIME: Approximately 1 minute.   CONTRAST USED: 20 mL.   INDICATION FOR PROCEDURE: This is a 69 year old African American male with end-stage renal disease. He had an intervention for an aneurysmal fistula earlier this week. They were unable to cannulate him on his dialysis access attempt today.   DESCRIPTION OF PROCEDURE: The patient was brought to the vascular and interventional radiology suite. Her right upper extremity was sterilely prepped and draped and a sterile surgical field was created. Ultrasound was used to visualize the cephalic vein not far beyond the arterial anastomosis and is accessed under direct ultrasound guidance without difficulty with a micropuncture needle, and a permanent image was recorded. A micropuncture wire and sheath were then placed. Imaging was performed through the micropuncture sheath. This showed patency of the fistula and all the stents, central venous circulation and good outflow.   At this point the sheath was removed around a 4-0 Monocryl pursestring suture. Pressure was held and a sterile dressing was placed. I used ultrasound to mark 3 areas where there was not significant aneurysm around the stents. One was in the initial portion of  the fistula beyond the anastomosis, one was in the upper arm near to the shoulder, and one was in the mid arm. These areas were marked with a permanent marker, and I discussed with his dialysis unit these would be the areas to attempt to stick. If they were still unable to cannulate these, we will have to place a PermCath until his aneurysms have shrunk to the point that this could be accessed more readily. The patient was then taken to the recovery room in stable condition.    ____________________________ Algernon Huxley, MD jsd:np D: 05/22/2013 13:17:20 ET T: 05/22/2013 20:41:23 ET JOB#: [id number]  cc: Algernon Huxley, MD, <Dictator> Algernon Huxley MD ELECTRONICALLY SIGNED 05/23/2013 8:18

## 2015-02-06 NOTE — Op Note (Signed)
PATIENT NAME:  [name], [name] MR#:  [id number] DATE OF BIRTH:  07/26/1946  DATE OF PROCEDURE:  05/21/2013  PREOPERATIVE DIAGNOSES: 1.  Complication of arteriovenous fistula, right arm.  2.  End-stage renal disease requiring hemodialysis.   POSTOPERATIVE DIAGNOSES: 1.  Complication of arteriovenous fistula, right arm.  2.  End-stage renal disease requiring hemodialysis.   PROCEDURE PERFORMED:   1.  Contrast injection, right brachiocephalic fistula.  2.  Percutaneous transluminal angioplasty and stent placement venous portion right arm brachiocephalic fistula.  3.  Percutaneous transluminal angioplasty and stent placement of the arterial portion right brachiocephalic fistula.   SURGEON: Hortencia Pilar, MD   SEDATION: Versed 4 mg plus fentanyl 150 mcg administered IV. Continuous ECG, pulse oximetry and cardiopulmonary monitoring is performed throughout the entire procedure by the interventional radiology nurse. Total sedation time is 50 minutes.   ACCESS:  1.  A 7-French sheath, antegrade direction, right arm brachiocephalic fistula.  2.  A 7-French sheath, retrograde direction, right arm brachiocephalic fistula.   CONTRAST USED: Isovue 50 mL.   FLUOROSCOPY TIME: 9.6 minutes.   INDICATIONS: Mr. Martus is a 69 year old gentleman who was sent from the dialysis center secondary to difficulties with access of his fistula and poor dialysis parameters. Risks and benefits for angiography and intervention were reviewed. All questions answered. The patient has agreed to proceed.   DESCRIPTION OF PROCEDURE: The patient is taken to special procedures and placed in the supine position with his right arm extended palm upward. Right arm is prepped and draped in sterile fashion. One percent lidocaine is infiltrated in the soft tissues overlying the palpable fistula near the antecubital fossa. In an antegrade direction, a microneedle is inserted, a microwire followed by a microsheath, J-wire followed  by a 6-French sheath. Hand injection of contrast is utilized to demonstrate the fistula. Previously placed Viabahn stent at the cephalic confluence is widely patent, as is the central veins. 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   In order to adequately treat the stricture, which has not responded to angioplasty in the past, use of covered stents will also entail repairing or correcting the pseudoaneurysms. Heparin, 3000 units, is given and an Advantage Wire with a KMP catheter is negotiated through the pseudoaneurysms and the strictures and into the proximal venous system. Next, an 8 mm wide x 70 mm long flared FLAIR Stent is advanced bareback through the over-the-wire and deployed beginning more proximally. A second 8 x 70 straight FLAIR stent is deployed, and lastly an 8 x 80 Fluency Stent is then deployed serially overlapping each of these stents by approximately 1.5 cm. They are then post dilated with an 8 balloon.   The skin overlying the fistula more proximally at the level of the deltoid is then anesthetized with lidocaine. A microneedle is inserted, a microwire, followed by a microsheath, J-wire followed by a 7-French sheath. Following this, the Advantage Wire and KMP catheter are negotiated into the brachial artery, and then a 9 x 50 flared FLAIR Stent is deployed with the flared portion facing the arterial anastomosis. On final deployment, this stent does lurch forward, and, therefore, an 8 x 40 straight FLAIR Stent is used to bridge the gap that is created between the arterial stent and the previously placed venous stents. From the retrograde sheath, this  is then post dilated with the 8 balloon. KMP catheter is then advanced over the wire into the  brachial artery, and follow-up angiography is obtained which demonstrates seal around the stents both proximally and distally.   Pursestring sutures  are placed around both sheaths.  The sheaths are removed, light pressure is held, and there are no immediate complications.   INTERPRETATION: Initial views of the fistula demonstrate 2 very large pseudoaneurysms with a high-grade stricture between them.  This has not responded to angioplasty, and, therefore, placing a covered stent at this location will entail also treating the pseudoaneurysms; and it is for this reason that both arterial and venous stents are placed in serial order to exclude both pseudoaneurysms as well as treat the central stenosis. This is performed as is explained up above with excellent result on final angiography.  SUMMARY: Successful treatment of the right brachiocephalic fistula.    ____________________________ Katha Cabal, MD ggs:cb D: 05/21/2013 18:10:16 ET T: 05/21/2013 22:34:12 ET JOB#: [id number]  cc: Katha Cabal, MD, <Dictator> Katha Cabal MD ELECTRONICALLY SIGNED 05/28/2013 8:50

## 2015-02-06 NOTE — Op Note (Signed)
PATIENT NAME:  [name], [name] MR#:  [id number] DATE OF BIRTH:  Feb 04, 1946  DATE OF PROCEDURE:  10/30/2012  PREOPERATIVE DIAGNOSES: Complication AV fistula with poor flows, increased venous pressures and prolonged bleeding.   POSTOPERATIVE DIAGNOSES:  Complication AV fistula with poor flows, increased venous pressures and prolonged bleeding.   PROCEDURES PERFORMED: 1.  Contrast injection right arm basilic transposition.  2.  Percutaneous transluminal angioplasty of the confluence of the cephalic and subclavian veins.  3.  Placement of a 10 mm Viabahn-covered stent at the subclavian. Confluence with the cephalic vein for failed angioplasty.   SURGEON: Katha Cabal, M.D.   SEDATION: Versed 3 mg plus fentanyl 100 mcg administered IV. Continuous ECG, pulse oximetry and cardiopulmonary monitoring was performed throughout the entire procedure by the interventional radiology nurse. Total sedation time was 50 minutes.   ACCESS: A 12-French sheath, right arm basilic transposition.   CONTRAST USED: Isovue 20 mL.   FLOUROSCOPY TIME: 5.1 minutes.   INDICATIONS: The patient is a 69 year old gentleman maintained on hemodialysis via a right arm basilic transposition. He has been sent for evaluation of his fistula secondary to increased bleeding, very high venous pressures and falling KT/v. The risks and benefits for treatment of his fistula were reviewed, all questions answered and the patient wishes to proceed.   DESCRIPTION OF PROCEDURE: The patient is taken to special procedures and placed in the supine position. After adequate sedation is achieved, he is positioned supine with his arm extended palm upward. Right arm is then prepped and draped in sterile fashion.   Lidocaine 1% is infiltrated into the soft tissues and access to the fistula is obtained with a micropuncture needle, microwire followed micro sheath, J-wire followed by a 6-French sheath and 6-French pigtail catheter.   Hand  injection of contrast is used to demonstrate the fistula. The fistula has 2 large aneurysmal areas that are free of thrombus. These are in the cannulation zones. It is rather tortuous throughout, but I do not see any hemodynamically significant stenoses within the upper arm portion. At the confluence with the cephalic and subclavian veins, there are tandem lesions, which represents a greater than 80% stenoses individually. This clearly is a flow-limiting spot. Central veins all appear widely patent.   Heparin, 3000 units is given. Magic torque wire is advanced through the lesions into the central venous system and subsequently an 8 x 6 Dorado balloon is used to angioplasty these 2 lesions. Two inflations are made, both to 18 atmospheres. Follow-up angiography demonstrates almost no change in the lesions and, therefore, it is elected stent them for failed angioplasty. A 10 x 50 Viabahn stent is then advanced across the confluence lesion and deployed and subsequently, a second 10 x 50 Viabahn stent is deployed to cover the second lesion. These are then angioplastied to 10 mm using a 10 x 4 Dorado balloon inflated to 28 atmospheres. Follow-up angiography demonstrates rapid flow of contrast through the stented segment with significant improvement in the quality of the thrill noted in the graft clinically.   Pursestring sutures then fashioned around the sheath. It should be noted that with the decision to use the 10 mm Viabahn an 11-French sheath is required. We therefore opened a 12-French sheath onto the field, as we do not have 11s in stock and a 12-French sheath was used to deliver the stents. A pursestring suture of 4-0 Monocryl is placed. The sheath is removed, light pressure is held, and there are no immediate complications.  INTERPRETATION: Initial views demonstrate aneurysmal segments of the fistula, however, the hemodynamically significant lesions identified or at the cephalic confluence with the  subclavian. This did not respond to angioplasty alone and therefore 10-mm Viabahn-covered stents are utilized and they are postdilated to 10 mm with a Dorado balloon.   SUMMARY: Successful salvage of the right arm brachiocephalic fistula.    ____________________________ Katha Cabal, MD ggs:cc D: 10/30/2012 17:10:11 ET T: 10/30/2012 22:33:17 ET JOB#: [id number]  cc: Katha Cabal, MD, <Dictator> Katha Cabal MD ELECTRONICALLY SIGNED 11/09/2012 9:59

## 2015-02-06 NOTE — Op Note (Signed)
PATIENT NAME:  GORJE, ALBU MR#:  [id number] DATE OF BIRTH:  January 30, 1946  DATE OF PROCEDURE:  07/11/2013  PREOPERATIVE DIAGNOSES: 1.  End-stage renal disease. 2.  Failed right arm arteriovenous fistula.  3.  Hypertension.   POSTOPERATIVE DIAGNOSES: 1.  End-stage renal disease. 2.  Failed right arm arteriovenous fistula.  3.  Hypertension.  PROCEDURE:  Left brachiocephalic AV fistula creation.   SURGEON:  Leotis Pain, MD  ASSISTANT:  Jairo Ben, PA-C.   ANESTHESIA:  General.   ESTIMATED BLOOD LOSS:  10 mL   INDICATION FOR PROCEDURE:  This is a 69 year old African American male with end-stage renal disease. 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 Risks and benefits are discussed. Informed consent was obtained. Preoperative vein mapping had showed adequate cephalic vein and brachial artery in the left arm for fistula creation.   DESCRIPTION OF PROCEDURE: The patient is brought to the operative suite and after an adequate level of general anesthesia was obtained, the left upper extremity was sterilely prepped and draped and a sterile surgical field was created. A curvilinear incision was created around the antecubital fossa and I dissected out the brachial artery, which was a nice usable artery for fistula creation. The cephalic vein was dissected out and marked for orientation. A large lateral branch was ligated and divided between silk ties. The patient was then systemically heparinized. Control was pulled up on the vessel loops and an anterior wall arteriotomy was created with #11 blade and extended with Potts scissors. The vein was then ligated distally, cut and beveled to match the arteriotomy and controlled with a bulldog clamp. The anastomosis  was created in the usual fashion with a running 6-0 Prolene suture.  The vessel was flushed and de-aired prior to releasing control. On release, there was a nice, soft thrill within the AV fistula and pulsatile flow in the brachial artery distal to the fistula. At this point, I elected to terminate the procedure. The Surgicel topical hemostatic agent was placed and hemostasis was complete. The wound was then closed with a running 3-0 Vicryl and 4-0 Monocryl. Dermabond was placed as a dressing. The patient tolerated the procedure well and was taken to the recovery room in stable condition.   ____________________________ Algernon Huxley, MD jsd:ce D: 07/11/2013 13:56:27 ET T: 07/11/2013 16:37:55 ET JOB#: [id number]  cc: Algernon Huxley, MD, <Dictator> Donato Heinz, MD  Algernon Huxley MD ELECTRONICALLY SIGNED 07/17/2013 15:08

## 2015-02-06 NOTE — Op Note (Signed)
PATIENT NAME:  [name], [name] MR#:  [id number] DATE OF BIRTH:  12-15-1945  DATE OF OPERATION:  05/23/2013  PREOPERATIVE DIAGNOSES: 1.  End-stage renal disease.  2.  Inability to access right arm arteriovenous fistula.  3.  Hypertension.   POSTOPERATIVE DIAGNOSES: 1.  End-stage renal disease.  2.  Inability to access right arm arteriovenous fistula.  3.  Hypertension.   PROCEDURES: 1.  Ultrasound guidance for vascular access to right and left jugular veins.  2.  Fluoroscopic guidance for placement of catheter.  3.  Placement of a 23 cm tip-to-cuff tunneled hemodialysis catheter to left jugular vein.   SURGEON: Dew.   ANESTHESIA: Local, with moderate conscious sedation.   ESTIMATED BLOOD LOSS: Approximately 25 mL.   INDICATION FOR PROCEDURE: A 69 year old African American male with end-stage renal disease. He has a fistula which had intervention earlier this week. They have been unable to access his fistula. This was re-examined yesterday and found to be patent, and areas were marked for them to use for access. However, they were still unable to use the fistula. For this reason we will have to place a catheter until the area heals and they will be more readily able to access the fistula.  DESCRIPTION OF PROCEDURE: Initially the patient's  right neck and chest were sterilely prepped and draped and a sterile surgical field was created. 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 I then turned my attention to the left jugular. This was widely patent on ultrasound. I accessed under direct ultrasound guidance without difficulty, and a wire passed easily. After skin nick and dilatation a peel-away sheath was placed over the wire and the wire and dilator were removed. I anesthetized an area two fingerbreadths below the clavicle, tunneled from the sub-clavicular incision to the  access site. Using fluoroscopic  guidance, a  23 cm tip-to-cuff tunneled hemodialysis catheter was selected, tunneled, placed through the peel-away sheath with catheters parked into the right atrium, and the sheath was removed. Appropriate distal connectors were placed. Both lumens withdrew blood well and flushed easily with heparinized saline. A concentrated heparin solution was then placed.   It was secured to the skin with 2 Prolene sutures, and a 4-0 Monocryl was used to close the access site, and a 4-0 Monocryl pursestring was placed around the access site.   A sterile dressing was placed and the patient tolerated the procedure well and was taken to the recovery room in stable condition.     ____________________________ Algernon Huxley, MD jsd:dm D: 05/23/2013 12:58:29 ET T: 05/23/2013 13:31:38 ET JOB#: [id number]  cc: Algernon Huxley, MD, <Dictator> Algernon Huxley MD ELECTRONICALLY SIGNED 06/03/2013 16:06

## 2015-02-07 NOTE — Op Note (Signed)
PATIENT NAME:  [name], [name] MR#:  [id number] DATE OF BIRTH:  1946/04/13  DATE OF PROCEDURE:  12/26/2013  PREOPERATIVE DIAGNOSES: 1.  End-stage renal disease.  2.  Non-maturing left arm arteriovenous fistula.  3.  Diabetes.  4.  Hypertension.   POSTOPERATIVE DIAGNOSES:   1.  End-stage renal disease.  2.  Non-maturing left arm arteriovenous fistula.  3.  Diabetes.  4.  Hypertension.   PROCEDURES PERFORMED: 1.  Ultrasound guidance for vascular access to left arm AV fistula.  2.  Left upper extremity fistulogram and central venogram.  3.  Percutaneous transluminal angioplasty with 5, 6, 7 and 8 mm diameter angioplasty balloons for a perianastomotic stenosis.  4.  Placement of 6 mm diameter Viabahn covered stent for residual stenosis after angioplasty to the perianastomotic stenosis.   SURGEON: Algernon Huxley, M.D.   ANESTHESIA: Local with moderate conscious sedation.   ESTIMATED BLOOD LOSS: Minimal.   INDICATION FOR PROCEDURE: This is a 69 year old [place] American male well known to [place] for his dialysis access needs. He had a left arm AV fistula placed last fall, which has not matured, and we are attempting to improve the flow to get this usable for dialysis.   DESCRIPTION OF PROCEDURE: The patient is brought to the vascular suite. The left upper extremity was sterilely prepped and draped and a sterile surgical field was created. The fistula was accessed in a retrograde fashion in the mid upper arm under direct ultrasound guidance without difficulty with a micropuncture needle, and a permanent image was recorded. Micropuncture wire and sheath were placed. Imaging showed a very tight perianastomotic stenosis. The remainder of the fistula did not show any obvious significant stenosis. There was spasm near the access site, but no true stenoses were seen. A 6-French sheath was placed. The patient was heparinized across the lesion without difficulty with a Magic Torque wire. Initially, a 5 mm  diameter conventional and high-pressure angioplasty balloon was inflated with inability to break or crack the waist on the lesion; upsized to a 6 mm balloon, and again was unsuccessful. A 7 mm balloon was also used and there was still a tight waist and a high-grade residual stenosis. A 6 mm diameter Viabahn stent was placed, at this point, just into the cephalic vein just beyond the anastomosis, keeping the brachial artery patent. There was still narrowing. I upsized to a 7 and an 8 mm diameter angioplasty balloon and inflated the 8 mm diameter angioplasty balloon up over 50 atmospheres within the stent, and there was still significant residual narrowing, but there was improved flow and the fistula was now clinically palpable and somewhat improved. At this point, I did not feel there were other endovascular options that were going to improve his flow, and I elected to terminate the procedure. The sheath was removed. A 4-0 Monocryl pursestring suture was placed and pressure was held. A sterile dressing was placed. The patient tolerated the procedure well and was taken to the recovery room in stable condition.     ____________________________ Algernon Huxley, MD jsd:dmm D: 12/26/2013 11:14:37 ET T: 12/26/2013 12:03:04 ET JOB#: [id number]  cc: Algernon Huxley, MD, <Dictator> Algernon Huxley MD ELECTRONICALLY SIGNED 01/20/2014 9:41

## 2015-02-07 NOTE — Op Note (Signed)
PATIENT NAME:  David, Savage MR#:  [id number] DATE OF BIRTH:  January 09, 1946  DATE OF PROCEDURE:  01/09/2014   PREOPERATIVE DIAGNOSES: 1.  End-stage renal disease.  2.  Clotted left arm arteriovenous fistula.   POSTOPERATIVE DIAGNOSES: 1.  End-stage renal disease.  2.  Clotted left arm arteriovenous fistula.   PROCEDURES PERFORMED: 1. Ultrasound guidance for vascular access to left brachiocephalic AV fistula in both an antegrade and retrograde fashion.  2.  Left upper extremity fistulogram and central venogram.  3.  Catheter-directed thrombolysis with 4 mg of tPA.  4.  Mechanical rheolytic thrombectomy to the AV fistula.  5.  PTA of the tight perianastomotic in-stent stenosis with 8 mm diameter high-pressure angioplasty balloon.  6.  Percutaneous transluminal angioplasty of mid upper arm cephalic vein and proximal upper arm cephalic vein with 7 mm diameter angioplasty balloon.    7. An 8 mm diameter x 5 cm length covered stent placed to the cephalic vein for residual stenosis and extravasation after angioplasty.   SURGEON: Algernon Huxley, M.D.   ANESTHESIA: Local with moderate conscious sedation.   ESTIMATED BLOOD LOSS: Approximately 25 mL.   INDICATION FOR PROCEDURE: A 69 year old gentleman with a clotted fistula we are trying to salvage.   DESCRIPTION OF PROCEDURE: The patient is brought to the vascular suite. The left upper extremity was sterilely prepped and draped and a sterile surgical field was created. The fistula was thrombosed and small and access was performed tediously, initially in a retrograde fashion in the mid upper arm with a micropuncture needle, and micropuncture wire and sheath were then placed. Through this, 2 mg of tPA were instilled. The stent in the cephalic vein just beyond the anastomosis previously was clotted, and the fistula was decompressed. I was able to cross the lesion with a Glidewire, and after tPA was instilled,  I ballooned the perianastomotic stenosis  with a very high-pressure angioplasty balloon. We got up to about 60 atmospheres to really relieve the waist in the balloon, and even then there was still some residual narrowing within the stent. The area now is improved. I was able access the fistula in an antegrade fashion just beyond the stent. The vein, again, was quite decompressed. Two more milligrams of tPA was delivered to help clear the thrombosis and mechanical rheolytic thrombectomy was performed throughout the cephalic vein in the left upper arm to help clear the clot. Angioplasty had to be performed in the mid upper arm cephalic vein and in the proximal upper arm cephalic vein. As the vein was small, a 7 mm balloon was inflated, which did restore patency to the lumen. Residual thrombus was treated with the AngioJet AVX mechanical thrombectomy catheter. Just beyond the previously placed stent in the cephalic vein, there was an area of about 70% residual stenosis, thrombus and extravasation. This was treated with a 8 mm diameter x 5 cm length Viabahn covered stent post dilated with an 8 mm balloon. A completion fistulogram following this showed the fistula now to be patent. There did not appear to be significant residual flow limitation. The perianastomotic stenosis was now only in about the 40% to 50% range, and there was an excellent thrill in the AV fistula. At this point, I elected to terminate the procedure. The sheath was removed; 4-0 Monocryl pursestring sutures were placed, pressure was held. Sterile dressings were placed. The patient tolerated the procedure well and was taken to the recovery room in stable condition.     ____________________________ Erskine Squibb  [name], MD jsd:dmm D: 01/10/2014 11:04:31 ET T: 01/10/2014 11:34:51 ET JOB#: [id number]  cc: Algernon Huxley, MD, <Dictator> Algernon Huxley MD ELECTRONICALLY SIGNED 01/20/2014 9:49

## 2015-02-07 NOTE — Op Note (Signed)
PATIENT NAME:  [name], [name] MR#:  [id number] DATE OF BIRTH:  1946/04/14  DATE OF PROCEDURE:  07/02/2014\  PREOPERATIVE DIAGNOSES:  1.  End-stage renal disease.  2.  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  3.  Hypertension.  4.  Diabetes.   POSTOPERATIVE DIAGNOSES: 1.  End-stage renal disease.  2.  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  3.  Hypertension.  4.  Diabetes.    PROCEDURES:  1.  Ultrasound guidance for vascular access to left brachiocephalic arteriovenous fistula.  2.  Left upper extremity fistulogram and central venogram.  3.  Percutaneous transluminal angioplasty of mid upper arm cephalic vein with 8 mm diameter high-pressure angioplasty balloon.  4.  Percutaneous transluminal angioplasty of left cephalic vein and subclavian vein confluence with 8 mm diameter high-pressure angioplasty balloon and 10 mm diameter conventional angioplasty balloon.  5.  Percutaneous transluminal angioplasty of left innominate vein with 10 and 12 mm diameter angioplasty balloon.   SURGEON: Algernon Huxley, MD    ANESTHESIA: Local with moderate conscious sedation.   ESTIMATED BLOOD LOSS: Minimal.   FLUOROSCOPY TIME: Four minutes and 40 mL of contrast were used.  INDICATION FOR PROCEDURE: This is a 69 year old gentleman with end-stage renal disease.  He presented to our office with some difficulty with poor flows on dialysis and several areas of aneurysmal formation in his access sites.  He had a noninvasive study which showed areas of stenosis in the outflow veins and he is brought in for further evaluation and potential treatment.  Risks and benefits were discussed.  Informed consent was obtained.   DESCRIPTION OF PROCEDURE: The patient is brought to the vascular suite. The left upper extremity was sterilely prepped and draped and a sterile surgical field was created. The  fistula was accessed around the arterial access site just beyond previously placed stents near the arterial anastomosis. This was done under direct ultrasound guidance and a patent portion of the vein with a micropuncture needle and a permanent image was recorded.  A micropuncture wire and sheath were then placed.  Imaging was performed after upsizing to a 6 French sheath and given the patient 3000 units of intravenous heparin.  This demonstrated a stenosis at the venous access site that was somewhat surprising.  This was in the 60% to 70% range and was not seen on previous duplex. There was an area at the cephalic vein and subclavian vein confluence with a near occlusive stenosis, which was expected from his noninvasive study.  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  This could not have been seen on the noninvasive study, but was identified well with angiography.  I used a Kumpe catheter and a Magic torque wire to navigate through all 3 separate and distinct areas of stenoses and I initially treated the mid upper arm cephalic vein lesion at the venous access site with an 8 mm diameter high-pressure angioplasty balloon.  A waist was seen in the balloon that did not resolve until 22 atmospheres. Following angioplasty, this area was markedly improved with only about a 10% to 20% residual stenosis.  The cephalic vein and subclavian vein confluence lesion was treated with an 8 mm diameter high-pressure angioplasty balloon as well.  This near occlusive stenosis did not completely resolve again until the balloon was inflated over 20 atmospheres.  Following angioplasty of  the 8 mm balloon in this location, there was still some residual narrowing at the vein valve stenosis and I upsized to a 10 mm diameter angioplasty balloon in this location with a good angiographic completion result and about a 15% to 20% residual stenosis.  I then turned my attention  to the innominate lesion.  Initially, I treated this area with a 10 mm diameter angioplasty balloon, inflated 8 atmospheres, narrowing was seen with balloon inflation, but this balloon was not large enough to fit the profile of the vein at this site.  I then upsized to a 12 mm diameter angioplasty balloon for the innominate vein stenosis and following inflation with a 12 mm diameter angioplasty balloon, there was significantly improved flow, there was about a 30% residual stenosis in the innominate vein, but this was not flow limiting.  At this point, I did not feel upsizing to a 14 mm diameter balloon was indicated.  At this point, I elected to terminate the procedure.  We had treated 3 separate and distinct lesions with the balloon inflated, I had also opacified the arterial portion in the anastomosis, which were widely patent and his previously placed stents near the anastomosis in the arterial portion were patent without significant stenosis. The sheath was removed, 4-0 Monocryl pursestring suture was placed.  Pressure was held. Sterile dressing was placed. The patient tolerated the procedure well and was taken to the recovery room in stable condition.       ____________________________ Algernon Huxley, MD jsd:DT D: 07/02/2014 14:58:16 ET T: 07/02/2014 15:57:24 ET JOB#: [id number]  cc: Algernon Huxley, MD, <Dictator> Algernon Huxley MD ELECTRONICALLY SIGNED 07/03/2014 15:05

## 2015-02-08 NOTE — Op Note (Signed)
PATIENT NAME:  [name], [name] MR#:  [id number] DATE OF BIRTH:  1946-01-26  DATE OF PROCEDURE:  10/27/2011  PREOPERATIVE DIAGNOSES:  1. End-stage renal disease.  2. Poorly functioning right arm AV fistula.   POSTOPERATIVE DIAGNOSES:  1. End-stage renal disease.  2. Poorly functioning right arm AV fistula.   PROCEDURES: 1. Ultrasound guidance for vascular access to right arm AV fistula.  2. Right upper extremity fistulogram and central venogram.  3. Percutaneous transluminal angioplasty of innominate/superior vena cava stenosis with 10 mm diameter angioplasty balloon.  4. Percutaneous transluminal angioplasty of two areas of cephalic vein stenosis with a 10 mm conventional and 8 mm high-pressure angioplasty balloon.   SURGEON: Algernon Huxley, MD  ANESTHESIA: Local with moderate conscious sedation.   ESTIMATED BLOOD LOSS: 25 mL.  FLUOROSCOPY TIME: 3.5 minutes.   CONTRAST USED: 20 mL.   INDICATION FOR PROCEDURE: This is a gentleman well known to me for his dialysis access needs. His current fistula has been used for some time, however, on recent noninvasive study has been significant stenosis in the outflow portion of the cephalic vein near the subclavian vein confluence and I recommended angiography for further evaluation and treatment to maintain the patency and improve the function of this fistula. Risks and benefits were discussed. Informed consent was obtained.   DESCRIPTION OF PROCEDURE: Patient is brought to the vascular interventional radiology suite. Right upper extremity is sterilely prepped and draped and a sterile surgical field was created. The fistula was accessed under direct ultrasound guidance without difficulty with a micropuncture needle and permanent image was recorded. Micropuncture wire and sheath were then placed. Imaging showed a very calcified valve with an approximately 60% stenosis. There was a tortuous stenotic lesion just before the subclavian confluence with an  approximately 80% to 90% stenosis further down the innominate and SVC. There appeared to be some narrowing in the 60% range. Patient was given 2500 units of intravenous heparin for systemic anticoagulation. I crossed all these lesions without difficulty with a Magic torque wire and Kumpe catheter. The superior vena cava and innominate lesion was treated with a 10 mm diameter angioplasty balloon. There was a significant waste taken which resolved and completion venogram showed this area to be patent. The cephalic vein areas were then also treated with a 10 mm diameter angioplasty balloon. At the burst pressure of this balloon there was still a waste at the tighter of the two lesions and so I switched to an 8 mm high-pressure angioplasty balloon inflating this to 20 atmospheres to resolve the waste. Completion angiogram showed an approximately 20% to 30% residual stenosis with less tortuosity in the tighter area and approximately 20% residual stenosis in the calcified valve closer to the shoulder. At this point there was good thrill within the AV fistula. The flow was improved and I elected to terminate the procedure. The sheath was removed around a 4-0 Monocryl pursestring suture. Pressure was held. Sterile dressing was placed. The patient tolerated procedure well and was taken to the recovery room in stable condition.   ____________________________ Algernon Huxley, MD jsd:cms D: 10/27/2011 10:04:22 ET T: 10/27/2011 11:47:13 ET JOB#: [id number]  cc: Algernon Huxley, MD, <Dictator> Donato Heinz, MD Algernon Huxley MD ELECTRONICALLY SIGNED 11/14/2011 9:14

## 2015-02-08 NOTE — Op Note (Signed)
PATIENT NAME:  [name], [name] MR#:  [id number] DATE OF BIRTH:  1946/04/27  DATE OF PROCEDURE:  11/22/2011  PREOPERATIVE DIAGNOSES:  1. Complication arteriovenous dialysis device.  2. End-stage renal disease requiring hemodialysis.   POSTOPERATIVE DIAGNOSES:  1. Complication arteriovenous dialysis device.  2. End-stage renal disease requiring hemodialysis.   PROCEDURES PERFORMED:  1. Contrast injection right brachiocephalic fistula.  2. Percutaneous transluminal angioplasty venous portion right brachiocephalic fistula to 10 mm.  3. Percutaneous transluminal angioplasty of the confluence of the cephalic and subclavian veins to 12 mm.   SURGEON: Hortencia Pilar, MD   SEDATION: Versed 3 mg plus fentanyl 100 mcg administered IV. Continuous ECG, pulse oximetry and cardiopulmonary monitoring is performed throughout the entire procedure by the Interventional Radiology nurse. Total sedation time was one hour.   ACCESS: A 7-French sheath, antegrade direction, right arm brachiocephalic fistula.   CONTRAST USED: Isovue 40 mL.   FLUOROSCOPY TIME:  Approximately 5 minutes.   INDICATIONS: [name] is a 69 year old gentleman who has been having increasing difficulty with his dialysis access, including decreasing Kt/V and increased bleeding. Physical examination as well as noninvasive studies demonstrate multiple areas of stricture, and he is undergoing angiography to confirm this and with the hope for intervention. The risks and benefits were reviewed. All questions are answered. The patient agrees to proceed.   DESCRIPTION OF PROCEDURE: The patient is taken to Special Procedures and placed in the supine position. After adequate sedation is achieved, he is positioned with his arm extended palm upward. The right arm is prepped and draped in sterile fashion. One percent lidocaine is infiltrated in the soft tissue overlying the fistula near the arterial anastomosis; and in an antegrade direction a  micropuncture needle is inserted, a Microwire followed by micro sheath, J-wire followed by a 6-French sheath. Hand injection of contrast is then utilized to demonstrate the fistula as well as the central venous anatomy. Four thousand units of heparin is given after the strictures are confirmed, and a Magic Torque Wire and KMP catheter is negotiated into the central venous anatomy. First, an 8 x 4 balloon is utilized to angioplasty the venous stricture, subsequently a 10 x 4, following which this area looked quite good. The 10 x 4 was then advanced to the cephalic confluence where the high-grade stricture was also noted. Following angioplasty to 10 mm, there was an inadequate result. The sheath was upsized to a 7-French sheath, and a 12 x 4 Conquest Balloon was advanced across the confluence and inflated to 16 atmospheres. Follow-up angiography now demonstrated complete resolution of that stricture.   The wire and balloon were removed. Pursestring suture of 4-0 Monocryl was placed around the sheath, and the sheath was removed, and there were no immediate complications.   INTERPRETATION: Initial images of the fistula demonstrate that it is quite tortuous. 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 At the level of the deltoid, there is a greater than 70% stricture. At the cephalic confluence with the subclavian, there is greater than 90% stricture. Arterial anastomosis is patent. Central venous anatomy is patent.   Following angioplasty first to 8 and then 10 mm of the venous lesion, there is less than 15% residual stenosis, although moderate irregularity is still noted. Following angioplasty, first to 10 and then 12 mm at the confluence, there is complete resolution of this stricture.   SUMMARY: Successful angioplasty for salvage of right arm brachiocephalic fistula.   ____________________________ Katha Cabal, MD ggs:cbb D: 11/22/2011 10:35:18 ET T: 11/22/2011 10:47:34  ET JOB#: [id number]  cc: Katha Cabal, MD, <Dictator> Katha Cabal MD ELECTRONICALLY SIGNED 12/12/2011 10:32

## 2015-02-11 NOTE — Op Note (Signed)
PATIENT NAME:  [name], FAGG MR#:  [id number] DATE OF BIRTH:  10-11-1946  DATE OF PROCEDURE:  10/16/2014  PREOPERATIVE DIAGNOSES: 1.  End-stage renal disease.  2.  Poorly functioning left arm arteriovenous fistula.   POSTOPERATIVE DIAGNOSES: 1.  End-stage renal disease.  2.  Poorly functioning left arm arteriovenous fistula.   PROCEDURES: 1. Ultrasound guidance for vascular access to left brachiocephalic AV fistula.  2. Left upper extremity fistulogram and central venogram.  3. Percutaneous transluminal angioplasty of mid upper arm cephalic vein with 8 mm diameter high-pressure angioplasty balloon.  4. Percutaneous transluminal angioplasty of cephalic vein and subclavian vein confluence with 6 mm diameter Lutonix drug-coated angioplasty balloon, as well as 8 and 9 mm diameter conventional angioplasty balloons.   SURGEON: Algernon Huxley, MD   ANESTHESIA: Local with moderate conscious sedation.   ESTIMATED BLOOD LOSS: Minimal.   INDICATION FOR PROCEDURE: This is a 69 year old gentleman well known to [place] for his dialysis access needs. His flows have been poor on dialysis, and we are asked to evaluate this A fistulogram was performed. Risks and benefits were discussed. Informed consent was obtained.   DESCRIPTION OF PROCEDURE: The patient is brought to the vascular suite. His left upper extremity was sterilely prepped and draped and a sterile surgical field was created. I accessed the fistula near the arterial access site under direct ultrasound guidance without difficulty with a micropuncture needle and a permanent image was recorded. A micropuncture wire and sheath were placed. We upsized to a 6 French sheath and imaging was then performed. This showed about a 65% to 70% stenosis between the access sites in the mid upper arm cephalic vein. There was about 60% stenosis just beyond the venous access site. Further up the arm at the cephalic vein and subclavian vein confluence,  there was about a  90% stenosis. The innominate vein appeared to have some mild stenosis that was not flow limiting. At this point, the patient was given 3000 units of intravenous heparin. The Magic Torque wire was used to cross all lesions. I initially treated the cephalic vein and subclavian vein confluence with a 6 mm diameter Lutonix drug-coated angioplasty balloon. This did not fit to enlarge the vein enough, and I upsized to an 8 and then a 9 mm diameter angioplasty balloon to treat the cephalic vein and subclavian vein confluence. Following this, the flow through there was normal with about a 10% to 15% residual stenosis all that remained. The mid upper arm cephalic vein stenoses could be treated with 1 inflation, as these were nearby. These were separate and distinct from the cephalic vein and subclavian vein confluence. The vein at this location was of reasonable size and an 8 mm diameter high-pressure angioplasty balloon was inflated. A tight narrowing was seen and this resolved at about 16 atmospheres. Completion angiogram following this showed markedly improved flow with about a 20% residual stenosis. At this point, I elected to terminate the procedure. The sheath was removed, 4-0 Monocryl pursestring suture was placed and pressure was held. Sterile dressings were placed. The patient tolerated the procedure well and was taken to the recovery room in stable condition.    ____________________________ Algernon Huxley, MD jsd:mw D: 10/16/2014 09:58:53 ET T: 10/16/2014 13:32:01 ET JOB#: [id number]  cc: Algernon Huxley, MD, <Dictator> Algernon Huxley MD ELECTRONICALLY SIGNED 10/20/2014 14:14

## 2015-02-16 ENCOUNTER — Encounter (HOSPITAL_COMMUNITY): Payer: Self-pay | Admitting: *Deleted

## 2015-02-16 MED ORDER — CYCLOPENTOLATE HCL 1 % OP SOLN
1.0000 [drp] | OPHTHALMIC | Status: AC | PRN
  Administered 2015-02-17 (×3): 1 [drp] via OPHTHALMIC
  Filled 2015-02-16: qty 2

## 2015-02-16 MED ORDER — PHENYLEPHRINE HCL 2.5 % OP SOLN
1.0000 [drp] | OPHTHALMIC | Status: AC | PRN
  Administered 2015-02-17 (×3): 1 [drp] via OPHTHALMIC
  Filled 2015-02-16: qty 2

## 2015-02-16 MED ORDER — CEFAZOLIN SODIUM-DEXTROSE 2-3 GM-% IV SOLR
2.0000 g | INTRAVENOUS | Status: AC
  Administered 2015-02-17: 2 g via INTRAVENOUS
  Filled 2015-02-16: qty 50

## 2015-02-16 MED ORDER — GATIFLOXACIN 0.5 % OP SOLN
1.0000 [drp] | OPHTHALMIC | Status: AC | PRN
  Administered 2015-02-17 (×3): 1 [drp] via OPHTHALMIC
  Filled 2015-02-16: qty 2.5

## 2015-02-16 MED ORDER — TROPICAMIDE 1 % OP SOLN
1.0000 [drp] | OPHTHALMIC | Status: AC | PRN
  Administered 2015-02-17 (×3): 1 [drp] via OPHTHALMIC
  Filled 2015-02-16: qty 3

## 2015-02-16 NOTE — Progress Notes (Signed)
   02/16/15 1235  OBSTRUCTIVE SLEEP APNEA  Have you ever been diagnosed with sleep apnea through a sleep study? No  Do you snore loudly (loud enough to be heard through closed doors)?  1  Do you often feel tired, fatigued, or sleepy during the daytime? 0  Has anyone observed you stop breathing during your sleep? 0  Do you have, or are you being treated for high blood pressure? 1  BMI more than 35 kg/m2? 0  Age over 67 years old? 1  Neck circumference greater than 40 cm/16 inches? 1  Gender: 1

## 2015-02-17 ENCOUNTER — Observation Stay (HOSPITAL_COMMUNITY)
Admission: RE | Admit: 2015-02-17 | Discharge: 2015-02-18 | Disposition: A | Discharge status: Home / Self Care | Payer: Medicare Other | Source: Ambulatory Visit | Attending: Ophthalmology | Admitting: Ophthalmology

## 2015-02-17 ENCOUNTER — Ambulatory Visit (HOSPITAL_COMMUNITY): Payer: Medicare Other | Admitting: Anesthesiology

## 2015-02-17 ENCOUNTER — Ambulatory Visit (HOSPITAL_COMMUNITY): Payer: Medicare Other

## 2015-02-17 ENCOUNTER — Encounter (HOSPITAL_COMMUNITY): Payer: Self-pay | Admitting: Anesthesiology

## 2015-02-17 ENCOUNTER — Encounter (HOSPITAL_COMMUNITY)
Admission: RE | Disposition: A | Discharge status: Home / Self Care | Payer: Self-pay | Source: Ambulatory Visit | Attending: Ophthalmology

## 2015-02-17 DIAGNOSIS — Z87891 Personal history of nicotine dependence: Secondary | ICD-10-CM | POA: Insufficient documentation

## 2015-02-17 DIAGNOSIS — N186 End stage renal disease: Secondary | ICD-10-CM | POA: Diagnosis not present

## 2015-02-17 DIAGNOSIS — E11359 Type 2 diabetes mellitus with proliferative diabetic retinopathy without macular edema: Secondary | ICD-10-CM | POA: Insufficient documentation

## 2015-02-17 DIAGNOSIS — Z886 Allergy status to analgesic agent status: Secondary | ICD-10-CM | POA: Insufficient documentation

## 2015-02-17 DIAGNOSIS — N400 Enlarged prostate without lower urinary tract symptoms: Secondary | ICD-10-CM | POA: Diagnosis not present

## 2015-02-17 DIAGNOSIS — Y9289 Other specified places as the place of occurrence of the external cause: Secondary | ICD-10-CM | POA: Diagnosis not present

## 2015-02-17 DIAGNOSIS — T8522XA Displacement of intraocular lens, initial encounter: Secondary | ICD-10-CM | POA: Diagnosis present

## 2015-02-17 DIAGNOSIS — Z91040 Latex allergy status: Secondary | ICD-10-CM | POA: Insufficient documentation

## 2015-02-17 DIAGNOSIS — Z419 Encounter for procedure for purposes other than remedying health state, unspecified: Secondary | ICD-10-CM

## 2015-02-17 DIAGNOSIS — T8529XS Other mechanical complication of intraocular lens, sequela: Secondary | ICD-10-CM | POA: Diagnosis not present

## 2015-02-17 DIAGNOSIS — I120 Hypertensive chronic kidney disease with stage 5 chronic kidney disease or end stage renal disease: Secondary | ICD-10-CM | POA: Diagnosis not present

## 2015-02-17 DIAGNOSIS — E780 Pure hypercholesterolemia: Secondary | ICD-10-CM | POA: Diagnosis not present

## 2015-02-17 DIAGNOSIS — H27139 Posterior dislocation of lens, unspecified eye: Secondary | ICD-10-CM | POA: Diagnosis present

## 2015-02-17 DIAGNOSIS — Y838 Other surgical procedures as the cause of abnormal reaction of the patient, or of later complication, without mention of misadventure at the time of the procedure: Secondary | ICD-10-CM | POA: Insufficient documentation

## 2015-02-17 DIAGNOSIS — H409 Unspecified glaucoma: Secondary | ICD-10-CM | POA: Diagnosis not present

## 2015-02-17 DIAGNOSIS — E113599 Type 2 diabetes mellitus with proliferative diabetic retinopathy without macular edema, unspecified eye: Secondary | ICD-10-CM | POA: Diagnosis present

## 2015-02-17 HISTORY — PX: PARS PLANA VITRECTOMY: SHX2166

## 2015-02-17 LAB — BASIC METABOLIC PANEL
Anion gap: 16 — ABNORMAL HIGH (ref 5–15)
BUN: 32 mg/dL — AB (ref 6–20)
CO2: 27 mmol/L (ref 22–32)
CREATININE: 11.54 mg/dL — AB (ref 0.61–1.24)
Calcium: 8.8 mg/dL — ABNORMAL LOW (ref 8.9–10.3)
Chloride: 95 mmol/L — ABNORMAL LOW (ref 101–111)
GFR calc non Af Amer: 4 mL/min — ABNORMAL LOW (ref >60)
GFR, EST AFRICAN AMERICAN: 5 mL/min — AB (ref >60)
Glucose, Bld: 180 mg/dL — ABNORMAL HIGH (ref 70–99)
POTASSIUM: 3.7 mmol/L (ref 3.5–5.1)
Sodium: 138 mmol/L (ref 135–145)

## 2015-02-17 LAB — CBC
HEMATOCRIT: 43.9 % (ref 39.0–52.0)
Hemoglobin: 14 g/dL (ref 13.0–17.0)
MCH: 31 pg (ref 26.0–34.0)
MCHC: 31.9 g/dL (ref 30.0–36.0)
MCV: 97.1 fL (ref 78.0–100.0)
Platelets: 231 10*3/uL (ref 150–400)
RBC: 4.52 MIL/uL (ref 4.22–5.81)
RDW: 14.4 % (ref 11.5–15.5)
WBC: 6.4 10*3/uL (ref 4.0–10.5)

## 2015-02-17 LAB — GLUCOSE, CAPILLARY
GLUCOSE-CAPILLARY: 118 mg/dL — AB (ref 70–99)
GLUCOSE-CAPILLARY: 186 mg/dL — AB (ref 70–99)
GLUCOSE-CAPILLARY: 267 mg/dL — AB (ref 70–99)

## 2015-02-17 SURGERY — PARS PLANA VITRECTOMY WITH 25G REMOVAL/SUTURE INTRAOCULAR LENS
Anesthesia: General | Site: Eye | Laterality: Left

## 2015-02-17 MED ORDER — MIDAZOLAM HCL 5 MG/5ML IJ SOLN
INTRAMUSCULAR | Status: DC | PRN
  Administered 2015-02-17: 1 mg via INTRAVENOUS

## 2015-02-17 MED ORDER — ALLOPURINOL 300 MG PO TABS: 300.0000 mg | ORAL_TABLET | Freq: Every day | ORAL | Status: DC

## 2015-02-17 MED ORDER — LIDOCAINE HCL (CARDIAC) 20 MG/ML IV SOLN
INTRAVENOUS | Status: DC | PRN
  Administered 2015-02-17: 60 mg via INTRATRACHEAL

## 2015-02-17 MED ORDER — NEOSTIGMINE METHYLSULFATE 10 MG/10ML IV SOLN
INTRAVENOUS | Status: DC | PRN
  Administered 2015-02-17: 5 mg via INTRAVENOUS

## 2015-02-17 MED ORDER — 0.9 % SODIUM CHLORIDE (POUR BTL) OPTIME
TOPICAL | Status: DC | PRN
  Administered 2015-02-17: 1000 mL

## 2015-02-17 MED ORDER — EPINEPHRINE HCL 1 MG/ML IJ SOLN
INTRAMUSCULAR | Status: DC | PRN
  Administered 2015-02-17: .3 mL

## 2015-02-17 MED ORDER — SODIUM CHLORIDE 0.45 % IV SOLN
INTRAVENOUS | Status: DC
  Administered 2015-02-17 (×2): via INTRAVENOUS

## 2015-02-17 MED ORDER — ONDANSETRON HCL 4 MG/2ML IJ SOLN
INTRAMUSCULAR | Status: DC | PRN
  Administered 2015-02-17: 4 mg via INTRAVENOUS

## 2015-02-17 MED ORDER — PROPOFOL 10 MG/ML IV BOLUS
INTRAVENOUS | Status: AC
  Filled 2015-02-17: qty 20

## 2015-02-17 MED ORDER — ACETAZOLAMIDE SODIUM 500 MG IJ SOLR
500.0000 mg | Freq: Once | INTRAMUSCULAR | Status: AC
  Administered 2015-02-18: 500 mg via INTRAVENOUS
  Filled 2015-02-17: qty 500

## 2015-02-17 MED ORDER — BRIMONIDINE TARTRATE-TIMOLOL 0.2-0.5 % OP SOLN: 1.0000 [drp] | Freq: Two times a day (BID) | OPHTHALMIC | Status: DC

## 2015-02-17 MED ORDER — LINAGLIPTIN 5 MG PO TABS: 5.0000 mg | ORAL_TABLET | Freq: Every day | ORAL | Status: DC

## 2015-02-17 MED ORDER — MAGNESIUM HYDROXIDE 400 MG/5ML PO SUSP: 15.0000 mL | Freq: Four times a day (QID) | ORAL | Status: DC | PRN

## 2015-02-17 MED ORDER — GLIMEPIRIDE 4 MG PO TABS
4.0000 mg | ORAL_TABLET | Freq: Every day | ORAL | Status: DC
  Filled 2015-02-17 (×2): qty 1

## 2015-02-17 MED ORDER — TIMOLOL MALEATE 0.5 % OP SOLN
1.0000 [drp] | Freq: Two times a day (BID) | OPHTHALMIC | Status: DC
  Administered 2015-02-17: 1 [drp] via OPHTHALMIC
  Filled 2015-02-17: qty 5

## 2015-02-17 MED ORDER — SODIUM CHLORIDE 0.9 % IJ SOLN
INTRAMUSCULAR | Status: AC
  Filled 2015-02-17: qty 10

## 2015-02-17 MED ORDER — CINACALCET HCL 30 MG PO TABS
90.0000 mg | ORAL_TABLET | Freq: Every day | ORAL | Status: DC
  Administered 2015-02-17: 90 mg via ORAL
  Filled 2015-02-17: qty 3

## 2015-02-17 MED ORDER — ATROPINE SULFATE 1 % OP SOLN
OPHTHALMIC | Status: AC
  Filled 2015-02-17: qty 5

## 2015-02-17 MED ORDER — ONDANSETRON HCL 4 MG/2ML IJ SOLN
INTRAMUSCULAR | Status: AC
  Filled 2015-02-17: qty 2

## 2015-02-17 MED ORDER — FENTANYL CITRATE (PF) 100 MCG/2ML IJ SOLN
INTRAMUSCULAR | Status: DC | PRN
  Administered 2015-02-17 (×2): 50 ug via INTRAVENOUS

## 2015-02-17 MED ORDER — TEMAZEPAM 15 MG PO CAPS: 15.0000 mg | ORAL_CAPSULE | Freq: Every evening | ORAL | Status: DC | PRN

## 2015-02-17 MED ORDER — BUPIVACAINE HCL (PF) 0.75 % IJ SOLN
INTRAMUSCULAR | Status: AC
  Filled 2015-02-17: qty 10

## 2015-02-17 MED ORDER — GLYCOPYRROLATE 0.2 MG/ML IJ SOLN
INTRAMUSCULAR | Status: AC
  Filled 2015-02-17: qty 3

## 2015-02-17 MED ORDER — PROMETHAZINE HCL 25 MG/ML IJ SOLN: 6.2500 mg | INTRAMUSCULAR | Status: DC | PRN

## 2015-02-17 MED ORDER — NEPAFENAC 0.1 % OP SUSP
1.0000 [drp] | Freq: Every day | OPHTHALMIC | Status: DC
  Filled 2015-02-17: qty 3

## 2015-02-17 MED ORDER — ACETAMINOPHEN 325 MG PO TABS: 650.0000 mg | ORAL_TABLET | Freq: Four times a day (QID) | ORAL | Status: DC | PRN

## 2015-02-17 MED ORDER — BSS PLUS IO SOLN
INTRAOCULAR | Status: AC
  Filled 2015-02-17: qty 500

## 2015-02-17 MED ORDER — GATIFLOXACIN 0.5 % OP SOLN
1.0000 [drp] | Freq: Four times a day (QID) | OPHTHALMIC | Status: DC
  Filled 2015-02-17: qty 2.5

## 2015-02-17 MED ORDER — SODIUM CHLORIDE 0.9 % IV SOLN
INTRAVENOUS | Status: DC
  Administered 2015-02-17: 11:00:00 via INTRAVENOUS

## 2015-02-17 MED ORDER — NEOSTIGMINE METHYLSULFATE 10 MG/10ML IV SOLN
INTRAVENOUS | Status: AC
  Filled 2015-02-17: qty 1

## 2015-02-17 MED ORDER — SODIUM HYALURONATE 10 MG/ML IO SOLN
INTRAOCULAR | Status: DC | PRN
  Administered 2015-02-17: 0.85 mL via INTRAOCULAR

## 2015-02-17 MED ORDER — BSS PLUS IO SOLN
INTRAOCULAR | Status: DC | PRN
  Administered 2015-02-17: 1

## 2015-02-17 MED ORDER — SUCROFERRIC OXYHYDROXIDE 500 MG PO CHEW: 500.0000 mg | CHEWABLE_TABLET | Freq: Three times a day (TID) | ORAL | Status: DC

## 2015-02-17 MED ORDER — BSS IO SOLN
INTRAOCULAR | Status: AC
  Filled 2015-02-17: qty 15

## 2015-02-17 MED ORDER — ROCURONIUM BROMIDE 50 MG/5ML IV SOLN
INTRAVENOUS | Status: AC
  Filled 2015-02-17: qty 1

## 2015-02-17 MED ORDER — ACETAMINOPHEN 325 MG PO TABS: 325.0000 mg | ORAL_TABLET | ORAL | Status: DC | PRN

## 2015-02-17 MED ORDER — PHENYLEPHRINE HCL 10 MG/ML IJ SOLN
INTRAMUSCULAR | Status: DC | PRN
  Administered 2015-02-17 (×3): 80 ug via INTRAVENOUS

## 2015-02-17 MED ORDER — MEPERIDINE HCL 25 MG/ML IJ SOLN: 6.2500 mg | INTRAMUSCULAR | Status: DC | PRN

## 2015-02-17 MED ORDER — METOPROLOL TARTRATE 12.5 MG HALF TABLET
25.0000 mg | ORAL_TABLET | Freq: Once | ORAL | Status: AC
  Administered 2015-02-17: 25 mg via ORAL
  Filled 2015-02-17: qty 2

## 2015-02-17 MED ORDER — MIDAZOLAM HCL 2 MG/2ML IJ SOLN
INTRAMUSCULAR | Status: AC
  Filled 2015-02-17: qty 2

## 2015-02-17 MED ORDER — AMLODIPINE BESYLATE 5 MG PO TABS: 5.0000 mg | ORAL_TABLET | Freq: Every day | ORAL | Status: DC

## 2015-02-17 MED ORDER — BACITRACIN-POLYMYXIN B 500-10000 UNIT/GM OP OINT
TOPICAL_OINTMENT | OPHTHALMIC | Status: AC
  Filled 2015-02-17: qty 3.5

## 2015-02-17 MED ORDER — PROPOFOL 10 MG/ML IV BOLUS
INTRAVENOUS | Status: DC | PRN
  Administered 2015-02-17: 30 mg via INTRAVENOUS
  Administered 2015-02-17: 120 mg via INTRAVENOUS
  Administered 2015-02-17: 30 mg via INTRAVENOUS

## 2015-02-17 MED ORDER — DOXAZOSIN MESYLATE 8 MG PO TABS
8.0000 mg | ORAL_TABLET | Freq: Every day | ORAL | Status: DC
  Filled 2015-02-17 (×2): qty 1

## 2015-02-17 MED ORDER — LATANOPROST 0.005 % OP SOLN
1.0000 [drp] | Freq: Every day | OPHTHALMIC | Status: DC
Start: 2015-02-17 — End: 2015-02-17

## 2015-02-17 MED ORDER — HYDROCODONE-ACETAMINOPHEN 5-325 MG PO TABS
1.0000 | ORAL_TABLET | ORAL | Status: DC | PRN
  Administered 2015-02-17 – 2015-02-18 (×2): 2 via ORAL
  Filled 2015-02-17 (×2): qty 2

## 2015-02-17 MED ORDER — BRIMONIDINE TARTRATE 0.2 % OP SOLN
1.0000 [drp] | Freq: Two times a day (BID) | OPHTHALMIC | Status: DC
  Filled 2015-02-17: qty 5

## 2015-02-17 MED ORDER — MIDAZOLAM HCL 2 MG/2ML IJ SOLN: 0.5000 mg | Freq: Once | INTRAMUSCULAR | Status: DC | PRN

## 2015-02-17 MED ORDER — GENTAMICIN SULFATE 40 MG/ML IJ SOLN
INTRAMUSCULAR | Status: AC
  Filled 2015-02-17: qty 2

## 2015-02-17 MED ORDER — MORPHINE SULFATE 2 MG/ML IJ SOLN
1.0000 mg | INTRAMUSCULAR | Status: DC | PRN
  Administered 2015-02-17: 2 mg via INTRAVENOUS
  Filled 2015-02-17: qty 1

## 2015-02-17 MED ORDER — PREDNISOLONE ACETATE 1 % OP SUSP
1.0000 [drp] | Freq: Four times a day (QID) | OPHTHALMIC | Status: DC
  Filled 2015-02-17: qty 5

## 2015-02-17 MED ORDER — SODIUM CHLORIDE 0.9 % IJ SOLN
INTRAMUSCULAR | Status: DC | PRN
  Administered 2015-02-17: 12:00:00

## 2015-02-17 MED ORDER — TRIAMCINOLONE ACETONIDE 40 MG/ML IJ SUSP
INTRAMUSCULAR | Status: AC
  Filled 2015-02-17: qty 5

## 2015-02-17 MED ORDER — EPINEPHRINE HCL 1 MG/ML IJ SOLN
INTRAMUSCULAR | Status: AC
  Filled 2015-02-17: qty 1

## 2015-02-17 MED ORDER — LATANOPROST 0.005 % OP SOLN
1.0000 [drp] | Freq: Every day | OPHTHALMIC | Status: DC
  Filled 2015-02-17: qty 2.5

## 2015-02-17 MED ORDER — ONDANSETRON HCL 4 MG/2ML IJ SOLN
4.0000 mg | Freq: Four times a day (QID) | INTRAMUSCULAR | Status: DC
  Administered 2015-02-17 – 2015-02-18 (×3): 4 mg via INTRAVENOUS
  Filled 2015-02-17 (×3): qty 2

## 2015-02-17 MED ORDER — DEXAMETHASONE SODIUM PHOSPHATE 10 MG/ML IJ SOLN
INTRAMUSCULAR | Status: AC
  Filled 2015-02-17: qty 1

## 2015-02-17 MED ORDER — LIDOCAINE HCL (CARDIAC) 20 MG/ML IV SOLN
INTRAVENOUS | Status: AC
Start: 2015-02-17 — End: 2015-02-17
  Filled 2015-02-17: qty 5

## 2015-02-17 MED ORDER — SALINE SPRAY 0.65 % NA SOLN: 2.0000 | Freq: Every day | NASAL | Status: DC | PRN

## 2015-02-17 MED ORDER — LIDOCAINE HCL (CARDIAC) 20 MG/ML IV SOLN
INTRAVENOUS | Status: AC
  Filled 2015-02-17: qty 5

## 2015-02-17 MED ORDER — FENTANYL CITRATE (PF) 100 MCG/2ML IJ SOLN: 25.0000 ug | INTRAMUSCULAR | Status: DC | PRN

## 2015-02-17 MED ORDER — TADALAFIL 20 MG PO TABS: 20.0000 mg | ORAL_TABLET | ORAL | Status: DC

## 2015-02-17 MED ORDER — BACITRACIN-POLYMYXIN B 500-10000 UNIT/GM OP OINT
1.0000 | TOPICAL_OINTMENT | Freq: Three times a day (TID) | OPHTHALMIC | Status: DC
Start: 2015-02-18 — End: 2015-02-18
  Filled 2015-02-17: qty 3.5

## 2015-02-17 MED ORDER — POLYMYXIN B SULFATE 500000 UNITS IJ SOLR
INTRAMUSCULAR | Status: AC
  Filled 2015-02-17: qty 1

## 2015-02-17 MED ORDER — BACITRACIN-POLYMYXIN B 500-10000 UNIT/GM OP OINT
TOPICAL_OINTMENT | OPHTHALMIC | Status: DC | PRN
  Administered 2015-02-17: 1 via OPHTHALMIC

## 2015-02-17 MED ORDER — BSS IO SOLN
INTRAOCULAR | Status: DC | PRN
  Administered 2015-02-17: 15 mL

## 2015-02-17 MED ORDER — TETRACAINE HCL 0.5 % OP SOLN
2.0000 [drp] | Freq: Once | OPHTHALMIC | Status: DC
  Filled 2015-02-17: qty 2

## 2015-02-17 MED ORDER — FENTANYL CITRATE (PF) 250 MCG/5ML IJ SOLN
INTRAMUSCULAR | Status: AC
  Filled 2015-02-17: qty 5

## 2015-02-17 MED ORDER — GLYCOPYRROLATE 0.2 MG/ML IJ SOLN
INTRAMUSCULAR | Status: DC | PRN
  Administered 2015-02-17: 0.1 mg via INTRAVENOUS
  Administered 2015-02-17: 0.6 mg via INTRAVENOUS

## 2015-02-17 MED ORDER — PHENYLEPHRINE HCL 10 MG/ML IJ SOLN
10.0000 mg | INTRAVENOUS | Status: DC | PRN
  Administered 2015-02-17: 20 ug/min via INTRAVENOUS

## 2015-02-17 MED ORDER — DEXAMETHASONE SODIUM PHOSPHATE 10 MG/ML IJ SOLN
INTRAMUSCULAR | Status: DC | PRN
  Administered 2015-02-17: 10 mg

## 2015-02-17 MED ORDER — SODIUM HYALURONATE 10 MG/ML IO SOLN
INTRAOCULAR | Status: AC
  Filled 2015-02-17: qty 0.85

## 2015-02-17 MED ORDER — BUPIVACAINE HCL (PF) 0.75 % IJ SOLN
INTRAMUSCULAR | Status: DC | PRN
  Administered 2015-02-17: 10 mL

## 2015-02-17 MED ORDER — ROCURONIUM BROMIDE 100 MG/10ML IV SOLN
INTRAVENOUS | Status: DC | PRN
Start: 2015-02-17 — End: 2015-02-17
  Administered 2015-02-17: 40 mg via INTRAVENOUS
  Administered 2015-02-17: 10 mg via INTRAVENOUS

## 2015-02-17 MED ORDER — METOPROLOL TARTRATE 25 MG PO TABS
25.0000 mg | ORAL_TABLET | Freq: Two times a day (BID) | ORAL | Status: DC
  Administered 2015-02-17: 25 mg via ORAL
  Filled 2015-02-17: qty 1

## 2015-02-17 MED ORDER — BRINZOLAMIDE 1 % OP SUSP
1.0000 [drp] | Freq: Three times a day (TID) | OPHTHALMIC | Status: DC
  Administered 2015-02-17: 1 [drp] via OPHTHALMIC
  Filled 2015-02-17: qty 10

## 2015-02-17 SURGICAL SUPPLY — 39 items
BLADE KERATOME 2.75 (BLADE) ×2 IMPLANT
BLADE KERATOME 2.75MM (BLADE) ×1
CANNULA VLV SOFT TIP 25GA (OPHTHALMIC) ×3 IMPLANT
CORDS BIPOLAR (ELECTRODE) ×3 IMPLANT
COTTONBALL LRG STERILE PKG (GAUZE/BANDAGES/DRESSINGS) ×9 IMPLANT
COVER MAYO STAND STRL (DRAPES) ×3 IMPLANT
DRAPE INCISE 51X51 W/FILM STRL (DRAPES) ×3 IMPLANT
DRAPE OPHTHALMIC 77X100 STRL (CUSTOM PROCEDURE TRAY) ×3 IMPLANT
FORCEPS GRIESHABER ILM 25G A (INSTRUMENTS) ×3 IMPLANT
GLOVE SURG SS PI 7.0 STRL IVOR (GLOVE) ×3 IMPLANT
GOWN STRL REUS W/ TWL LRG LVL3 (GOWN DISPOSABLE) ×3 IMPLANT
GOWN STRL REUS W/TWL LRG LVL3 (GOWN DISPOSABLE) ×6
HANDLE PNEUMATIC FOR CONSTEL (OPHTHALMIC) ×3 IMPLANT
KIT BASIN OR (CUSTOM PROCEDURE TRAY) ×3 IMPLANT
KIT ROOM TURNOVER OR (KITS) ×3 IMPLANT
LENS IOL POST 1PIECE DIOP 15.5 (Intraocular Lens) ×3 IMPLANT
NEEDLE 18GX1X1/2 (RX/OR ONLY) (NEEDLE) ×3 IMPLANT
NEEDLE 25GX 5/8IN NON SAFETY (NEEDLE) ×3 IMPLANT
NEEDLE FILTER BLUNT 18X 1/2SAF (NEEDLE) ×2
NEEDLE FILTER BLUNT 18X1 1/2 (NEEDLE) ×1 IMPLANT
NEEDLE HYPO 30X.5 LL (NEEDLE) ×3 IMPLANT
NS IRRIG 1000ML POUR BTL (IV SOLUTION) ×3 IMPLANT
PACK VITRECTOMY CUSTOM (CUSTOM PROCEDURE TRAY) ×3 IMPLANT
PAD ARMBOARD 7.5X6 YLW CONV (MISCELLANEOUS) ×6 IMPLANT
PAK PIK VITRECTOMY CVS 25GA (OPHTHALMIC) ×3 IMPLANT
PIC ILLUMINATED 25G (OPHTHALMIC) ×3
PIK ILLUMINATED 25G (OPHTHALMIC) ×1 IMPLANT
PROBE LASER ILLUM FLEX CVD 25G (OPHTHALMIC) ×3 IMPLANT
ROLLS DENTAL (MISCELLANEOUS) ×6 IMPLANT
SPONGE SURGIFOAM ABS GEL 12-7 (HEMOSTASIS) ×3 IMPLANT
SUT CHROMIC 7 0 TG140 8 (SUTURE) ×3 IMPLANT
SUT ETHILON 10 0 CS140 6 (SUTURE) ×3 IMPLANT
SUT POLY NON ABSORB 10-0 8 STR (SUTURE) ×6 IMPLANT
SYR 20CC LL (SYRINGE) ×3 IMPLANT
SYR BULB 3OZ (MISCELLANEOUS) ×3 IMPLANT
SYR TB 1ML LUER SLIP (SYRINGE) ×3 IMPLANT
TAPE SURG TRANSPORE 1 IN (GAUZE/BANDAGES/DRESSINGS) ×1 IMPLANT
TAPE SURGICAL TRANSPORE 1 IN (GAUZE/BANDAGES/DRESSINGS) ×2
WATER STERILE IRR 1000ML POUR (IV SOLUTION) ×3 IMPLANT

## 2015-02-17 NOTE — Brief Op Note (Signed)
Brief Operative note   Preoperative diagnosis:  dislocated IOL left eye Postoperative diagnosis  * No Diagnosis Codes entered *  Procedures: Pars plana vitrectomy, removal of dislocated intraocular lens from vitreous, placement of secondary intraocular lens with suture left eye, temporal approach.  Surgeon:  Hayden Pedro, MD...  Assistant:  Deatra Ina SA  Clancy Gourd RN  Anesthesia: General  Specimen: none  Estimated blood loss:  1cc  Complications: none  Patient sent to PACU in good condition  Composed by Hayden Pedro MD  Dictation number: [id number]

## 2015-02-17 NOTE — Anesthesia Postprocedure Evaluation (Signed)
  Anesthesia Post-op Note  Patient: [name]  Procedure(s) Performed: Procedure(s): PARS PLANA VITRECTOMY WITH 25G REMOVAL/SUTURE INTRAOCULAR LENS (Left)  Patient Location: PACU  Anesthesia Type:General  Level of Consciousness: awake, alert , oriented and patient cooperative  Airway and Oxygen Therapy: Patient Spontanous Breathing  Post-op Pain: mild  Post-op Assessment: Post-op Vital signs reviewed, Patient's Cardiovascular Status Stable, Respiratory Function Stable, Patent Airway, No signs of Nausea or vomiting and Pain level controlled  Post-op Vital Signs: Reviewed and stable  Last Vitals:  Filed Vitals:   02/17/15 1538  BP: 129/65  Pulse: 59  Temp: 36.8 C  Resp: 16    Complications: No apparent anesthesia complications

## 2015-02-17 NOTE — Anesthesia Preprocedure Evaluation (Addendum)
Anesthesia Evaluation  Patient identified by MRN, date of birth, ID band Patient awake    Reviewed: Allergy & Precautions, NPO status , Patient's Chart, lab work & pertinent test results, reviewed documented beta blocker date and time   History of Anesthesia Complications Negative for: history of anesthetic complications  Airway Mallampati: I  TM Distance: >3 FB Neck ROM: Full    Dental  (+) Partial Upper, Partial Lower, Dental Advisory Given   Pulmonary COPDformer smoker (quit 1991),  breath sounds clear to auscultation        Cardiovascular hypertension, Pt. on medications and Pt. 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)     Musculoskeletal   Abdominal   Peds  Hematology negative hematology ROS (+)   Anesthesia Other Findings   Reproductive/Obstetrics                           Anesthesia Physical Anesthesia Plan  ASA: III  Anesthesia Plan: General   Post-op Pain Management:    Induction: Intravenous  Airway Management Planned: Oral ETT  Additional Equipment:   Intra-op Plan:   Post-operative Plan: Extubation in OR  Informed Consent: I have reviewed the patients History and Physical, chart, labs and discussed the procedure including the risks, benefits and alternatives for the proposed anesthesia with the patient or authorized representative who has indicated his/her understanding and acceptance.     Plan Discussed with: CRNA and Surgeon  Anesthesia Plan Comments: (Plan routine monitors, GETA)        Anesthesia Quick Evaluation

## 2015-02-17 NOTE — Progress Notes (Signed)
Call to [facility]. For cardiac studies, left voicemail for the transplant clinic to send cardiac records. Dr. Jenita Seashore, aware of efforts to receive records.

## 2015-02-17 NOTE — Transfer of Care (Signed)
Immediate Anesthesia Transfer of Care Note  Patient: [name]  Procedure(s) Performed: Procedure(s): PARS PLANA VITRECTOMY WITH 25G REMOVAL/SUTURE INTRAOCULAR LENS (Left)  Patient Location: PACU  Anesthesia Type:General  Level of Consciousness: awake, oriented and patient cooperative  Airway & Oxygen Therapy: Patient Spontanous Breathing and Patient connected to nasal cannula oxygen  Post-op Assessment: Report given to RN and Post -op Vital signs reviewed and stable  Post vital signs: Reviewed  Last Vitals:  Filed Vitals:   02/17/15 0852  BP: 132/84  Pulse: 73  Temp: 36.6 C  Resp: 20    Complications: No apparent anesthesia complications

## 2015-02-17 NOTE — H&P (Signed)
I examined the patient today and there is no change in the medical status 

## 2015-02-17 NOTE — Anesthesia Procedure Notes (Signed)
Procedure Name: Intubation Date/Time: 02/17/2015 11:58 AM Performed by: Jenne Campus Pre-anesthesia Checklist: Patient identified, Emergency Drugs available, Suction available, Patient being monitored and Timeout performed Patient Re-evaluated:Patient Re-evaluated prior to inductionOxygen Delivery Method: Circle system utilized Preoxygenation: Pre-oxygenation with 100% oxygen Intubation Type: IV induction Ventilation: Mask ventilation without difficulty Laryngoscope Size: Miller and 3 Grade View: Grade I Tube type: Oral Tube size: 7.5 mm Number of attempts: 1 Airway Equipment and Method: Stylet and LTA kit utilized Placement Confirmation: ETT inserted through vocal cords under direct vision,  positive ETCO2,  CO2 detector and breath sounds checked- equal and bilateral Secured at: 23 cm Tube secured with: Tape Dental Injury: Teeth and Oropharynx as per pre-operative assessment

## 2015-02-18 ENCOUNTER — Encounter (HOSPITAL_COMMUNITY): Payer: Self-pay | Admitting: Ophthalmology

## 2015-02-18 DIAGNOSIS — T8522XA Displacement of intraocular lens, initial encounter: Secondary | ICD-10-CM | POA: Diagnosis not present

## 2015-02-18 MED ORDER — BRIMONIDINE TARTRATE 0.2 % OP SOLN: 1.0000 [drp] | Freq: Two times a day (BID) | OPHTHALMIC | Status: DC

## 2015-02-18 MED ORDER — GATIFLOXACIN 0.5 % OP SOLN: 1.0000 [drp] | Freq: Four times a day (QID) | OPHTHALMIC | Status: DC

## 2015-02-18 MED ORDER — PREDNISOLONE ACETATE 1 % OP SUSP: 1.0000 [drp] | Freq: Four times a day (QID) | OPHTHALMIC | Status: DC

## 2015-02-18 MED ORDER — BACITRACIN-POLYMYXIN B 500-10000 UNIT/GM OP OINT: 1.0000 "application " | TOPICAL_OINTMENT | Freq: Three times a day (TID) | OPHTHALMIC | Status: DC

## 2015-02-18 MED ORDER — BRINZOLAMIDE 1 % OP SUSP: 1.0000 [drp] | Freq: Three times a day (TID) | OPHTHALMIC | Status: DC

## 2015-02-18 MED ORDER — HYDROCODONE-ACETAMINOPHEN 5-325 MG PO TABS: 1.0000 | ORAL_TABLET | ORAL | Status: DC | PRN

## 2015-02-18 MED ORDER — TIMOLOL MALEATE 0.5 % OP SOLN: 1.0000 [drp] | Freq: Two times a day (BID) | OPHTHALMIC | Status: DC

## 2015-02-18 NOTE — Progress Notes (Signed)
Pt discharged, discharge instruction given.

## 2015-02-18 NOTE — Op Note (Signed)
NAME:  [name], [name]             ACCOUNT NO.:  [id number]  MEDICAL RECORD NO.:  [id number]  LOCATION:  6N30C                        FACILITY:  [facility]  PHYSICIAN:  Chrystie Nose. Zigmund Daniel, M.D. DATE OF BIRTH:  1946/03/01  DATE OF PROCEDURE:  02/17/2015 DATE OF DISCHARGE:                              OPERATIVE REPORT   ADMISSION DIAGNOSIS:  Dislocated intraocular lens, left eye.  PROCEDURES:  Pars plana vitrectomy, removal of dislocated intraocular lens from the vitreous, placement of secondary intraocular lens with suture all in the left eye.  SURGEON:  Chrystie Nose. Zigmund Daniel, M.D.  ASSISTANT:  Deatra Ina, SA.  ANESTHESIA:  General.  NOTE:  This is a glaucoma patient who had a glaucoma filter at 11 o'clock  done by another surgeon.  The positioning of surgical procedure, the wound and the implant was all dependent upon efforts to save the glaucoma filter at 11 o'clock.  Therefore, the incisions were temporal.  DETAILS OF THE PROCEDURE:  Usual prep and drape, the conjunctival peritomy from 12 o'clock to 6:30 o'clock.  Half thickness scleral flaps were raised at 12:30 o'clock and 6:30 o'clock in anticipation of IOL suture.  A 3 layered corneal scleral wound was created from 12:30 o'clock to 4 o'clock.  The 25-gauge trocars were placed at 9 o'clock, 2 o'clock, and 5 o'clock.  Infusion at 5 o'clock.  The pars plana vitrectomy was begun just behind the pupillary axis.  The BIOM viewing system was moved into place and Provisc was placed on the corneal surface.  The pars plana vitrectomy was carried into the mid vitreous, where a core vitrectomy had previously been performed.  The vitrectomy was carried into the far periphery, where additional vitreous remnants were seen.  The intraocular lens was ensnared in vitreous.  The vitreous was trimmed carefully from the eyelets of the lens and the haptic.  Once the implant became freed, the corneoscleral wound was opened with keratome incision.   Intraocular lens was passed from the vitreous cavity into the anterior chamber and then out through the corneoscleral wound. It was sent for identification purposes.  There was a 3 piece implant with a central clear piece and 2 eyelets.  A new intraocular lens was brought onto the field made by [employer], power 15.5 D, length 12.5 mm, optic 7.0 mm, serial [id number], expiration date Mar 17, 2015. This lens was brought onto the field inspected and cleaned.  Two Prolene sutures were passed beneath the scleral flaps behind the iris and in the ciliary sulcus from 12:30 o'clock to 6:30 o'clock.  A docking suture was used.  The Prolene sutures were drawn out of the corneal wound and attached to the eyelets of the new intraocular lens.  The lens again was inspected and cleaned. Lens was passed into the anterior chamber, then into the posterior chamber and the ciliary sulcus.  It was dialed into place.  Prolene sutures were drawn securely and knotted beneath the scleral flaps.  The implant moved into a proper position centrally.  The Prolene sutures were trimmed and the scleral flaps were allowed to lie over the wound. The corneal wound was closed with 5 interrupted 10-0 nylon sutures.  The  wound was tested and found to be secure.  Additional Provisc was placed on the corneal surface at this time.  Pars plana vitrectomy, again was performed removing some capsular remnants and other remnants in the vitreous cavity.  Once the entire vitreous cavity was clean, it was inspected.  There was extensive panretinal photocoagulation scars present.  The vitrectomy was completed.  The instruments were removed from the eye and the wounds were tested and found to be secure.  The conjunctiva was closed with 7-0 chromic suture.  Polymyxin and gentamicin were irrigated into tenon space. Marcaine was injected around the globe for postop pain.  Decadron 10 mg was injected into the  lower subconjunctival space.  Marcaine was injected around the globe. Polymyxin and gentamicin were irrigated around the globe as well. Closing pressure was 10 with a Barraquer tonometer.  Polysporin ophthalmic ointment a patch and shield were placed.  The patient was awakened and taken to recovery in satisfactory condition.  Operative time 1 hour 30 minutes.     Chrystie Nose. Zigmund Daniel, M.D.     JDM/MEDQ  D:  02/17/2015  T:  02/18/2015  Job:  [id number]

## 2015-02-18 NOTE — Discharge Summary (Signed)
Discharge summary not needed on [facility] patients per medical records. 

## 2015-02-18 NOTE — Progress Notes (Signed)
02/18/2015, 6:14 AM  Mental Status:  Awake, Alert, Oriented  Anterior segment: Cornea  Clear wound intact with flat conjunctiva    Anterior Chamber Clear    Lens:    IOL  Intra Ocular Pressure 24 mmHg with Tonopen  Vitreous: Clear  Retina:  Attached Good laser reaction   Impression: Excellent result Retina attached  Final Diagnosis: Principal Problem:   Dislocated IOL (intraocular lens), posterior Active Problems:   Proliferative diabetic retinopathy   Plan: start post operative eye drops.   Use new glaucoma bottles because of recent surgery.  Discharge to home.  Give post operative instructions  [name] 02/18/2015, 6:14 AM

## 2015-02-24 ENCOUNTER — Encounter (INDEPENDENT_AMBULATORY_CARE_PROVIDER_SITE_OTHER): Payer: Medicare Other | Admitting: Ophthalmology

## 2015-02-24 DIAGNOSIS — H2701 Aphakia, right eye: Secondary | ICD-10-CM

## 2015-03-19 ENCOUNTER — Encounter (INDEPENDENT_AMBULATORY_CARE_PROVIDER_SITE_OTHER): Payer: Medicare Other | Admitting: Ophthalmology

## 2015-03-19 DIAGNOSIS — H2702 Aphakia, left eye: Secondary | ICD-10-CM

## 2015-05-18 ENCOUNTER — Other Ambulatory Visit (HOSPITAL_COMMUNITY): Payer: Self-pay | Admitting: Nephrology

## 2015-05-18 DIAGNOSIS — I10 Essential (primary) hypertension: Secondary | ICD-10-CM

## 2015-05-25 ENCOUNTER — Ambulatory Visit (HOSPITAL_COMMUNITY)
Admission: RE | Admit: 2015-05-25 | Discharge: 2015-05-25 | Disposition: A | Discharge status: Home / Self Care | Payer: Medicare Other | Source: Ambulatory Visit | Attending: Nephrology | Admitting: Nephrology

## 2015-05-25 DIAGNOSIS — I10 Essential (primary) hypertension: Secondary | ICD-10-CM | POA: Insufficient documentation

## 2015-05-25 DIAGNOSIS — N1330 Unspecified hydronephrosis: Secondary | ICD-10-CM | POA: Insufficient documentation

## 2015-05-25 DIAGNOSIS — Z940 Kidney transplant status: Secondary | ICD-10-CM | POA: Insufficient documentation

## 2015-06-02 ENCOUNTER — Encounter (INDEPENDENT_AMBULATORY_CARE_PROVIDER_SITE_OTHER): Payer: Medicare Other | Admitting: Ophthalmology

## 2015-06-03 ENCOUNTER — Encounter (INDEPENDENT_AMBULATORY_CARE_PROVIDER_SITE_OTHER): Payer: Medicare Other | Admitting: Ophthalmology

## 2015-06-03 DIAGNOSIS — H43813 Vitreous degeneration, bilateral: Secondary | ICD-10-CM | POA: Diagnosis not present

## 2015-06-03 DIAGNOSIS — E11359 Type 2 diabetes mellitus with proliferative diabetic retinopathy without macular edema: Secondary | ICD-10-CM | POA: Diagnosis not present

## 2015-06-03 DIAGNOSIS — E10319 Type 1 diabetes mellitus with unspecified diabetic retinopathy without macular edema: Secondary | ICD-10-CM | POA: Diagnosis not present

## 2015-06-03 DIAGNOSIS — H35031 Hypertensive retinopathy, right eye: Secondary | ICD-10-CM

## 2015-06-03 DIAGNOSIS — I10 Essential (primary) hypertension: Secondary | ICD-10-CM | POA: Diagnosis not present

## 2015-12-07 ENCOUNTER — Ambulatory Visit (INDEPENDENT_AMBULATORY_CARE_PROVIDER_SITE_OTHER): Payer: Medicare Other | Admitting: Ophthalmology

## 2016-02-25 ENCOUNTER — Other Ambulatory Visit (HOSPITAL_COMMUNITY): Payer: Self-pay | Admitting: Internal Medicine

## 2016-02-25 DIAGNOSIS — Z940 Kidney transplant status: Secondary | ICD-10-CM

## 2016-02-26 ENCOUNTER — Ambulatory Visit (HOSPITAL_COMMUNITY): Payer: Medicare Other

## 2016-03-15 ENCOUNTER — Encounter: Payer: Self-pay | Admitting: Podiatry

## 2016-03-15 ENCOUNTER — Ambulatory Visit (INDEPENDENT_AMBULATORY_CARE_PROVIDER_SITE_OTHER): Payer: Medicare Other | Admitting: Podiatry

## 2016-03-15 VITALS — BP 159/79 | HR 60 | Resp 12

## 2016-03-15 DIAGNOSIS — M79675 Pain in left toe(s): Secondary | ICD-10-CM

## 2016-03-15 DIAGNOSIS — M79674 Pain in right toe(s): Secondary | ICD-10-CM

## 2016-03-15 DIAGNOSIS — B351 Tinea unguium: Secondary | ICD-10-CM

## 2016-03-15 NOTE — Patient Instructions (Signed)
Diabetes and Foot Care Diabetes may cause you to have problems because of poor blood supply (circulation) to your feet and legs. This may cause the skin on your feet to become thinner, break easier, and heal more slowly. Your skin may become dry, and the skin may peel and crack. You may also have nerve damage in your legs and feet causing decreased feeling in them. You may not notice minor injuries to your feet that could lead to infections or more serious problems. Taking care of your feet is one of the most important things you can do for yourself.  HOME CARE INSTRUCTIONS  Wear shoes at all times, even in the house. Do not go barefoot. Bare feet are easily injured.  Check your feet daily for blisters, cuts, and redness. If you cannot see the bottom of your feet, use a mirror or ask someone for help.  Wash your feet with warm water (do not use hot water) and mild soap. Then pat your feet and the areas between your toes until they are completely dry. Do not soak your feet as this can dry your skin.  Apply a moisturizing lotion or petroleum jelly (that does not contain alcohol and is unscented) to the skin on your feet and to dry, brittle toenails. Do not apply lotion between your toes.  Trim your toenails straight across. Do not dig under them or around the cuticle. File the edges of your nails with an emery board or nail file.  Do not cut corns or calluses or try to remove them with medicine.  Wear clean socks or stockings every day. Make sure they are not too tight. Do not wear knee-high stockings since they may decrease blood flow to your legs.  Wear shoes that fit properly and have enough cushioning. To break in new shoes, wear them for just a few hours a day. This prevents you from injuring your feet. Always look in your shoes before you put them on to be sure there are no objects inside.  Do not cross your legs. This may decrease the blood flow to your feet.  If you find a minor scrape,  cut, or break in the skin on your feet, keep it and the skin around it clean and dry. These areas may be cleansed with mild soap and water. Do not cleanse the area with peroxide, alcohol, or iodine.  When you remove an adhesive bandage, be sure not to damage the skin around it.  If you have a wound, look at it several times a day to make sure it is healing.  Do not use heating pads or hot water bottles. They may burn your skin. If you have lost feeling in your feet or legs, you may not know it is happening until it is too late.  Make sure your health care provider performs a complete foot exam at least annually or more often if you have foot problems. Report any cuts, sores, or bruises to your health care provider immediately. SEEK MEDICAL CARE IF:   You have an injury that is not healing.  You have cuts or breaks in the skin.  You have an ingrown nail.  You notice redness on your legs or feet.  You feel burning or tingling in your legs or feet.  You have pain or cramps in your legs and feet.  Your legs or feet are numb.  Your feet always feel cold. SEEK IMMEDIATE MEDICAL CARE IF:   There is increasing redness,   swelling, or pain in or around a wound.  There is a red line that goes up your leg.  Pus is coming from a wound.  You develop a fever or as directed by your health care provider.  You notice a bad smell coming from an ulcer or wound.   This information is not intended to replace advice given to you by your health care provider. Make sure you discuss any questions you have with your health care provider.   Document Released: 09/30/2000 Document Revised: 06/05/2013 Document Reviewed: 03/12/2013 Elsevier Interactive Patient Education 2016 [employer] Inc[employer]  

## 2016-03-15 NOTE — Progress Notes (Signed)
   Subjective:    Patient ID: [name], male    DOB: 01/08/1946, 70 y.o.   MRN: [id number]  HPI    This patient presents today complaining of elongated and thickened toenails with gradual increasing discomfort walking wearing shoes over the last several months but becoming more symptomatic in the past month. Patient's wife is attentive to trim the toenails, however, not able to do so.  Patient is a diabetic and denies any history of claudication, amputation, foot ulceration Patient's has a history of kidney transplant  Review of Systems  Eyes: Positive for visual disturbance.  Cardiovascular: Positive for leg swelling.       Objective:   Physical Exam  Orientated 3  Vascular: No peripheral edema bilaterally DP and PT pulses 2/4 bilaterally Capillary reflex immediate bilaterally  Neurological: Sensation to 10 g monofilament wire intact 5/5 bilaterally Vibratory sensation reactive bilaterally Ankle reflex equal and reactive bilaterally  Dermatological: No open skin lesions bilaterally The toenails are extremely hypertrophic, elongated, discolored, deformed and tender to direct palpation 6-10  Musculoskeletal: Pes planus bilaterally HAV bilaterally There is no restriction ankle, subtalar, midtarsal joints bilaterally      Assessment & Plan:   Assessment: Satisfactory neurovascular status Diabetic without foot complications Neglected symptomatic onychomycoses 6-10  Plan: Today review the results of the examination today and offered patient nail debridement and he verbally consents. The toenails 6-10 are debrided mechanically and electrically without any bleeding  Reappoint 3 months

## 2016-06-08 DIAGNOSIS — Z961 Presence of intraocular lens: Secondary | ICD-10-CM | POA: Insufficient documentation

## 2016-06-08 DIAGNOSIS — H401190 Primary open-angle glaucoma, unspecified eye, stage unspecified: Secondary | ICD-10-CM | POA: Insufficient documentation

## 2016-06-21 ENCOUNTER — Ambulatory Visit: Payer: Medicare Other | Admitting: Podiatry

## 2016-06-29 ENCOUNTER — Ambulatory Visit (INDEPENDENT_AMBULATORY_CARE_PROVIDER_SITE_OTHER): Payer: Medicare Other | Admitting: Podiatry

## 2016-06-29 ENCOUNTER — Encounter: Payer: Self-pay | Admitting: Podiatry

## 2016-06-29 VITALS — BP 163/83 | HR 73 | Resp 14

## 2016-06-29 DIAGNOSIS — M79674 Pain in right toe(s): Secondary | ICD-10-CM | POA: Diagnosis not present

## 2016-06-29 DIAGNOSIS — M79675 Pain in left toe(s): Secondary | ICD-10-CM

## 2016-06-29 DIAGNOSIS — B351 Tinea unguium: Secondary | ICD-10-CM

## 2016-06-29 NOTE — Patient Instructions (Signed)
Diabetes and Foot Care Diabetes may cause you to have problems because of poor blood supply (circulation) to your feet and legs. This may cause the skin on your feet to become thinner, break easier, and heal more slowly. Your skin may become dry, and the skin may peel and crack. You may also have nerve damage in your legs and feet causing decreased feeling in them. You may not notice minor injuries to your feet that could lead to infections or more serious problems. Taking care of your feet is one of the most important things you can do for yourself.  HOME CARE INSTRUCTIONS  Wear shoes at all times, even in the house. Do not go barefoot. Bare feet are easily injured.  Check your feet daily for blisters, cuts, and redness. If you cannot see the bottom of your feet, use a mirror or ask someone for help.  Wash your feet with warm water (do not use hot water) and mild soap. Then pat your feet and the areas between your toes until they are completely dry. Do not soak your feet as this can dry your skin.  Apply a moisturizing lotion or petroleum jelly (that does not contain alcohol and is unscented) to the skin on your feet and to dry, brittle toenails. Do not apply lotion between your toes.  Trim your toenails straight across. Do not dig under them or around the cuticle. File the edges of your nails with an emery board or nail file.  Do not cut corns or calluses or try to remove them with medicine.  Wear clean socks or stockings every day. Make sure they are not too tight. Do not wear knee-high stockings since they may decrease blood flow to your legs.  Wear shoes that fit properly and have enough cushioning. To break in new shoes, wear them for just a few hours a day. This prevents you from injuring your feet. Always look in your shoes before you put them on to be sure there are no objects inside.  Do not cross your legs. This may decrease the blood flow to your feet.  If you find a minor scrape,  cut, or break in the skin on your feet, keep it and the skin around it clean and dry. These areas may be cleansed with mild soap and water. Do not cleanse the area with peroxide, alcohol, or iodine.  When you remove an adhesive bandage, be sure not to damage the skin around it.  If you have a wound, look at it several times a day to make sure it is healing.  Do not use heating pads or hot water bottles. They may burn your skin. If you have lost feeling in your feet or legs, you may not know it is happening until it is too late.  Make sure your health care provider performs a complete foot exam at least annually or more often if you have foot problems. Report any cuts, sores, or bruises to your health care provider immediately. SEEK MEDICAL CARE IF:   You have an injury that is not healing.  You have cuts or breaks in the skin.  You have an ingrown nail.  You notice redness on your legs or feet.  You feel burning or tingling in your legs or feet.  You have pain or cramps in your legs and feet.  Your legs or feet are numb.  Your feet always feel cold. SEEK IMMEDIATE MEDICAL CARE IF:   There is increasing redness,   swelling, or pain in or around a wound.  There is a red line that goes up your leg.  Pus is coming from a wound.  You develop a fever or as directed by your health care provider.  You notice a bad smell coming from an ulcer or wound.   This information is not intended to replace advice given to you by your health care provider. Make sure you discuss any questions you have with your health care provider.   Document Released: 09/30/2000 Document Revised: 06/05/2013 Document Reviewed: 03/12/2013 Elsevier Interactive Patient Education 2016 [employer] Inc[employer]  

## 2016-06-30 NOTE — Progress Notes (Signed)
Patient ID: [name], male   DOB: 17-Apr-1946, 70 y.o.   MRN: [id number]    Subjective: This patient presents today again complaining of thickened and elongated toenails which are on call for walking wearing shoes and requests toenail debridement.  Patient is a history of diabetes with the history of kidney transplant Denies history of claudication, amputation of foot ulceration  Objective: Orientated 3  Vascular: No peripheral edema bilaterally DP and PT pulses 2/4 bilaterally Capillary reflex immediate bilaterally  Neurological: Sensation to 10 g monofilament wire intact 5/5 bilaterally Vibratory sensation reactive bilaterally Ankle reflex equal and reactive bilaterally  Dermatological: No open skin lesions bilaterally The toenails are extremely hypertrophic, elongated, discolored, deformed and tender to direct palpation 6-10  Musculoskeletal: Pes planus bilaterally HAV bilaterally There is no restriction ankle, subtalar, midtarsal joints bilaterally  Assessment: Satisfactory neurovascular status 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

## 2016-07-06 DIAGNOSIS — Z9889 Other specified postprocedural states: Secondary | ICD-10-CM | POA: Insufficient documentation

## 2016-08-04 ENCOUNTER — Other Ambulatory Visit (INDEPENDENT_AMBULATORY_CARE_PROVIDER_SITE_OTHER): Payer: Self-pay | Admitting: Vascular Surgery

## 2016-08-04 ENCOUNTER — Encounter (INDEPENDENT_AMBULATORY_CARE_PROVIDER_SITE_OTHER): Payer: Self-pay

## 2016-08-08 ENCOUNTER — Encounter
Admission: RE | Disposition: A | Discharge status: Home / Self Care | Payer: Self-pay | Source: Ambulatory Visit | Attending: Vascular Surgery

## 2016-08-08 ENCOUNTER — Other Ambulatory Visit (INDEPENDENT_AMBULATORY_CARE_PROVIDER_SITE_OTHER): Payer: Self-pay | Admitting: Vascular Surgery

## 2016-08-08 ENCOUNTER — Ambulatory Visit
Admission: RE | Admit: 2016-08-08 | Discharge: 2016-08-08 | Disposition: A | Discharge status: Home / Self Care | Payer: Medicare Other | Source: Ambulatory Visit | Attending: Vascular Surgery | Admitting: Vascular Surgery

## 2016-08-08 DIAGNOSIS — Z833 Family history of diabetes mellitus: Secondary | ICD-10-CM | POA: Diagnosis not present

## 2016-08-08 DIAGNOSIS — Z91040 Latex allergy status: Secondary | ICD-10-CM | POA: Diagnosis not present

## 2016-08-08 DIAGNOSIS — N186 End stage renal disease: Secondary | ICD-10-CM | POA: Insufficient documentation

## 2016-08-08 DIAGNOSIS — Z940 Kidney transplant status: Secondary | ICD-10-CM | POA: Diagnosis not present

## 2016-08-08 DIAGNOSIS — E1122 Type 2 diabetes mellitus with diabetic chronic kidney disease: Secondary | ICD-10-CM | POA: Insufficient documentation

## 2016-08-08 DIAGNOSIS — E7800 Pure hypercholesterolemia, unspecified: Secondary | ICD-10-CM | POA: Insufficient documentation

## 2016-08-08 DIAGNOSIS — N400 Enlarged prostate without lower urinary tract symptoms: Secondary | ICD-10-CM | POA: Diagnosis not present

## 2016-08-08 DIAGNOSIS — Z9841 Cataract extraction status, right eye: Secondary | ICD-10-CM | POA: Insufficient documentation

## 2016-08-08 DIAGNOSIS — I120 Hypertensive chronic kidney disease with stage 5 chronic kidney disease or end stage renal disease: Secondary | ICD-10-CM | POA: Insufficient documentation

## 2016-08-08 DIAGNOSIS — Y832 Surgical operation with anastomosis, bypass or graft as the cause of abnormal reaction of the patient, or of later complication, without mention of misadventure at the time of the procedure: Secondary | ICD-10-CM | POA: Diagnosis not present

## 2016-08-08 DIAGNOSIS — E119 Type 2 diabetes mellitus without complications: Secondary | ICD-10-CM | POA: Diagnosis not present

## 2016-08-08 DIAGNOSIS — I10 Essential (primary) hypertension: Secondary | ICD-10-CM | POA: Diagnosis not present

## 2016-08-08 DIAGNOSIS — M1990 Unspecified osteoarthritis, unspecified site: Secondary | ICD-10-CM | POA: Insufficient documentation

## 2016-08-08 DIAGNOSIS — Z885 Allergy status to narcotic agent status: Secondary | ICD-10-CM | POA: Insufficient documentation

## 2016-08-08 DIAGNOSIS — Z9842 Cataract extraction status, left eye: Secondary | ICD-10-CM | POA: Diagnosis not present

## 2016-08-08 DIAGNOSIS — Z87891 Personal history of nicotine dependence: Secondary | ICD-10-CM | POA: Diagnosis not present

## 2016-08-08 DIAGNOSIS — Z881 Allergy status to other antibiotic agents status: Secondary | ICD-10-CM | POA: Insufficient documentation

## 2016-08-08 DIAGNOSIS — T82858A Stenosis of vascular prosthetic devices, implants and grafts, initial encounter: Secondary | ICD-10-CM | POA: Insufficient documentation

## 2016-08-08 DIAGNOSIS — Z992 Dependence on renal dialysis: Secondary | ICD-10-CM | POA: Diagnosis not present

## 2016-08-08 DIAGNOSIS — Z8249 Family history of ischemic heart disease and other diseases of the circulatory system: Secondary | ICD-10-CM | POA: Diagnosis not present

## 2016-08-08 HISTORY — PX: PERIPHERAL VASCULAR CATHETERIZATION: SHX172C

## 2016-08-08 LAB — POTASSIUM (ARMC VASCULAR LAB ONLY): Potassium (ARMC vascular lab): 4 (ref 3.5–5.1)

## 2016-08-08 SURGERY — A/V SHUNTOGRAM/FISTULAGRAM
Anesthesia: Moderate Sedation | Site: Arm Upper

## 2016-08-08 MED ORDER — ACETAMINOPHEN 325 MG RE SUPP
325.0000 mg | RECTAL | Status: DC | PRN
  Filled 2016-08-08: qty 2

## 2016-08-08 MED ORDER — HEPARIN (PORCINE) IN NACL 2-0.9 UNIT/ML-% IJ SOLN
INTRAMUSCULAR | Status: AC
  Filled 2016-08-08: qty 1000

## 2016-08-08 MED ORDER — FENTANYL CITRATE (PF) 100 MCG/2ML IJ SOLN
INTRAMUSCULAR | Status: AC
  Filled 2016-08-08: qty 2

## 2016-08-08 MED ORDER — FENTANYL CITRATE (PF) 100 MCG/2ML IJ SOLN
INTRAMUSCULAR | Status: DC | PRN
  Administered 2016-08-08: 50 ug via INTRAVENOUS

## 2016-08-08 MED ORDER — LABETALOL HCL 5 MG/ML IV SOLN: 10.0000 mg | INTRAVENOUS | Status: DC | PRN

## 2016-08-08 MED ORDER — METOPROLOL TARTRATE 5 MG/5ML IV SOLN: 2.0000 mg | INTRAVENOUS | Status: DC | PRN

## 2016-08-08 MED ORDER — HYDRALAZINE HCL 20 MG/ML IJ SOLN: 5.0000 mg | INTRAMUSCULAR | Status: DC | PRN

## 2016-08-08 MED ORDER — PHENOL 1.4 % MT LIQD: 1.0000 | OROMUCOSAL | Status: DC | PRN

## 2016-08-08 MED ORDER — HEPARIN SODIUM (PORCINE) 1000 UNIT/ML IJ SOLN
INTRAMUSCULAR | Status: AC
  Filled 2016-08-08: qty 1

## 2016-08-08 MED ORDER — ONDANSETRON HCL 4 MG/2ML IJ SOLN: 4.0000 mg | Freq: Four times a day (QID) | INTRAMUSCULAR | Status: DC | PRN

## 2016-08-08 MED ORDER — DEXTROSE 5 % IV SOLN: 1.5000 g | INTRAVENOUS | Status: DC

## 2016-08-08 MED ORDER — HEPARIN SODIUM (PORCINE) 1000 UNIT/ML IJ SOLN
INTRAMUSCULAR | Status: DC | PRN
  Administered 2016-08-08: 3000 [IU] via INTRAVENOUS

## 2016-08-08 MED ORDER — SODIUM CHLORIDE 0.9 % IV SOLN
INTRAVENOUS | Status: DC
  Administered 2016-08-08: 14:00:00 via INTRAVENOUS

## 2016-08-08 MED ORDER — OXYCODONE-ACETAMINOPHEN 5-325 MG PO TABS: 1.0000 | ORAL_TABLET | ORAL | Status: DC | PRN

## 2016-08-08 MED ORDER — FAMOTIDINE 20 MG PO TABS
40.0000 mg | ORAL_TABLET | ORAL | Status: DC | PRN
Start: 2016-08-08 — End: 2016-08-08

## 2016-08-08 MED ORDER — MIDAZOLAM HCL 2 MG/2ML IJ SOLN
INTRAMUSCULAR | Status: DC | PRN
  Administered 2016-08-08: 2 mg via INTRAVENOUS

## 2016-08-08 MED ORDER — ACETAMINOPHEN 325 MG PO TABS: 325.0000 mg | ORAL_TABLET | ORAL | Status: DC | PRN

## 2016-08-08 MED ORDER — METHYLPREDNISOLONE SODIUM SUCC 125 MG IJ SOLR: 125.0000 mg | INTRAMUSCULAR | Status: DC | PRN

## 2016-08-08 MED ORDER — ONDANSETRON 4 MG PO TBDP
ORAL_TABLET | ORAL | Status: AC
  Filled 2016-08-08: qty 1

## 2016-08-08 MED ORDER — ALUM & MAG HYDROXIDE-SIMETH 200-200-20 MG/5ML PO SUSP: 15.0000 mL | ORAL | Status: DC | PRN

## 2016-08-08 MED ORDER — IOPAMIDOL (ISOVUE-300) INJECTION 61%
INTRAVENOUS | Status: DC | PRN
  Administered 2016-08-08: 30 mL via INTRAVENOUS

## 2016-08-08 MED ORDER — ONDANSETRON 4 MG PO TBDP
4.0000 mg | ORAL_TABLET | Freq: Once | ORAL | Status: AC
  Administered 2016-08-08: 4 mg via ORAL

## 2016-08-08 MED ORDER — MORPHINE SULFATE (PF) 4 MG/ML IV SOLN: 2.0000 mg | INTRAVENOUS | Status: DC | PRN

## 2016-08-08 MED ORDER — GUAIFENESIN-DM 100-10 MG/5ML PO SYRP: 15.0000 mL | ORAL_SOLUTION | ORAL | Status: DC | PRN

## 2016-08-08 MED ORDER — MIDAZOLAM HCL 2 MG/2ML IJ SOLN
INTRAMUSCULAR | Status: AC
  Filled 2016-08-08: qty 2

## 2016-08-08 MED ORDER — LIDOCAINE HCL (PF) 1 % IJ SOLN
INTRAMUSCULAR | Status: AC
  Filled 2016-08-08: qty 30

## 2016-08-08 MED ORDER — SODIUM CHLORIDE 0.9 % IV SOLN: 500.0000 mL | Freq: Once | INTRAVENOUS | Status: DC | PRN

## 2016-08-08 SURGICAL SUPPLY — 11 items
BALLN DORADO 7X60X80 (BALLOONS) ×4
BALLN LUTONIX AV 8X60X75 (BALLOONS) ×4
BALLOON DORADO 7X60X80 (BALLOONS) ×2 IMPLANT
BALLOON LUTONIX AV 8X60X75 (BALLOONS) ×2 IMPLANT
CANNULA 5F STIFF (CANNULA) ×4 IMPLANT
DEVICE PRESTO INFLATION (MISCELLANEOUS) ×4 IMPLANT
DRAPE BRACHIAL (DRAPES) ×4 IMPLANT
PACK ANGIOGRAPHY (CUSTOM PROCEDURE TRAY) ×4 IMPLANT
SHEATH BRITE TIP 6FRX5.5 (SHEATH) ×4 IMPLANT
TOWEL OR 17X26 4PK STRL BLUE (TOWEL DISPOSABLE) ×4 IMPLANT
WIRE MAGIC TORQUE 260C (WIRE) ×4 IMPLANT

## 2016-08-08 NOTE — Discharge Instructions (Signed)
Fistulogram, Care After °Refer to this sheet in the next few weeks. These instructions provide you with information on caring for yourself after your procedure. Your health care provider may also give you more specific instructions. Your treatment has been planned according to current medical practices, but problems sometimes occur. Call your health care provider if you have any problems or questions after your procedure. °WHAT TO EXPECT AFTER THE PROCEDURE °After your procedure, it is typical to have the following: °· A small amount of discomfort in the area where the catheters were placed. °· A small amount of bruising around the fistula. °· Sleepiness and fatigue. °HOME CARE INSTRUCTIONS °· Rest at home for the day following your procedure. °· Do not drive or operate heavy machinery while taking pain medicine. °· Take medicines only as directed by your health care provider. °· Do not take baths, swim, or use a hot tub until your health care provider approves. You may shower 24 hours after the procedure or as directed by your health care provider. °· There are many different ways to close and cover an incision, including stitches, skin glue, and adhesive strips. Follow your health care provider's instructions on: °¨ Incision care. °¨ Bandage (dressing) changes and removal. °¨ Incision closure removal. °· Monitor your dialysis fistula carefully. °SEEK MEDICAL CARE IF: °· You have drainage, redness, swelling, or pain at your catheter site. °· You have a fever. °· You have chills. °SEEK IMMEDIATE MEDICAL CARE IF: °· You feel weak. °· You have trouble balancing. °· You have trouble moving your arms or legs. °· You have problems with your speech or vision. °· You can no longer feel a vibration or buzz when you put your fingers over your dialysis fistula. °· The limb that was used for the procedure: °¨ Swells. °¨ Is painful. °¨ Is cold. °¨ Is discolored, such as blue or pale white. °  °This information is not intended  to replace advice given to you by your health care provider. Make sure you discuss any questions you have with your health care provider. °  °Document Released: 02/17/2014 Document Reviewed: 02/17/2014 °Elsevier Interactive Patient Education ©2016 [employer]vier Inc. ° °

## 2016-08-08 NOTE — H&P (Signed)
[facility] Admission History & Physical  MRN : [id number]  [name] is a 70 y.o. ([birth year]) male who presents with chief complaint of No chief complaint on file. [name]  History of Present Illness: Patient is a 70 year old male who has end-stage renal disease for about 8-10 years. 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 His nephrologist noticed that the fistula had become quite pulsatile and requested to have this evaluated with a limited amount of contrast if possible. He is doing well and does not have specific complaints today.  Current Facility-Administered Medications  Medication Dose Route Frequency Provider Last Rate Last Dose  . cefUROXime (ZINACEF) 1.5 g in dextrose 5 % 50 mL IVPB  1.5 g Intravenous 30 min Pre-Op Sela Hua, PA-C        Past Medical History:  Diagnosis Date  . Anemia    hx of at beginnning of dialysis- 05/2009  . Arthritis    right hip   . End stage renal disease ([place])    [place]   . End stage renal disease on dialysis ([place])   . Essential hypertension, malignant   . Hypertrophy of prostate without urinary obstruction and other lower urinary tract symptoms (LUTS)   . Pure hypercholesterolemia   . Renal insufficiency   . Type II or unspecified type diabetes mellitus without mention of complication, not stated as uncontrolled    TYPE 2     Past Surgical History:  Procedure Laterality Date  . CARDIAC CATHETERIZATION  04/29/10   normal coronaries, LVEF 45%, mild global LV hypokinesis ([place])  . CATARACT EXTRACTION W/PHACO Right 04/03/2013   Procedure: CATARACT EXTRACTION PHACO AND INTRAOCULAR LENS PLACEMENT (IOC);  Surgeon: Adonis Brook, MD;  Location: [facility];  Service: Ophthalmology;  Laterality: Right;  . CERVICAL DISCECTOMY  03/1999  . COLONOSCOPY     Hx: of  . CYSTOSCOPY WITH BIOPSY   11/01/2012   Procedure: CYSTOSCOPY WITH BIOPSY;  Surgeon: Molli Hazard, MD;  Location: [facility];  Service: Urology;  Laterality: N/A;  with fulgeration  . CYSTOSCOPY WITH RETROGRADE PYELOGRAM, URETEROSCOPY AND STENT PLACEMENT  11/01/2012   Procedure: CYSTOSCOPY WITH RETROGRADE PYELOGRAM, URETEROSCOPY AND STENT PLACEMENT;  Surgeon: Molli Hazard, MD;  Location: [facility];  Service: Urology;  Laterality: Bilateral;  . DIALYSIS FISTULA CREATION  2010  . EYE SURGERY  2012   cataracts  . EYE SURGERY  11/2014  . HOLMIUM LASER APPLICATION  2/95/1884   Procedure: HOLMIUM LASER APPLICATION;  Surgeon: Molli Hazard, MD;  Location: [facility];  Service: Urology;  Laterality: Bilateral;  . KIDNEY TRANSPLANT  2016  . MEMBRANE PEEL Right 09/04/2013   Procedure: MEMBRANE PEEL;  Surgeon: Adonis Brook, MD;  Location: [facility];  Service: Ophthalmology;  Laterality: Right;  . NECK SURGERY  1990  . PARS PLANA VITRECTOMY Right 09/04/2013   Procedure: PARS PLANA VITRECTOMY WITH 23 GAUGE;  Surgeon: Adonis Brook, MD;  Location: [facility];  Service: Ophthalmology;  Laterality: Right;  . PARS PLANA VITRECTOMY Left 02/17/2015  . PARS PLANA VITRECTOMY Left 02/17/2015   Procedure: PARS PLANA VITRECTOMY WITH 25G REMOVAL/SUTURE INTRAOCULAR LENS;  Surgeon: Hayden Pedro, MD;  Location: [facility];  Service: Ophthalmology;  Laterality: Left;  . PHOTOCOAGULATION WITH LASER Right 09/04/2013   Procedure: PHOTOCOAGULATION WITH LASER;  Surgeon: Adonis Brook, MD;  Location: [facility];  Service: Ophthalmology;  Laterality: Right;  ENDOLASER  . TRANSTHORACIC ECHOCARDIOGRAM  06/19/2012   normal LV sys function, EF 55-60%, mild to mod diastolic dysfunction, mild AI ([place])    Social History Social History  Substance Use Topics  . Smoking status: Former Smoker    Types: Cigarettes    Quit date: 10/02/1990  . Smokeless tobacco: Never Used  . Alcohol use Yes     Comment: occasional    Family History Family History   Problem Relation Age of Onset  . Breast cancer Mother 44    deceased  . Pneumonia Father 74    deceased  . Hypertension Brother   . Diabetes Mellitus II Brother     Allergies  Allergen Reactions  . Codeine Nausea And Vomiting  . Doxycycline Nausea And Vomiting  . Latex Hives     REVIEW OF SYSTEMS (Negative unless checked)  Constitutional: [] Weight loss  [] Fever  [] Chills Cardiac: [] Chest pain   [] Chest pressure   [] Palpitations   [] Shortness of breath when laying flat   [] Shortness of breath at rest   [] Shortness of breath with exertion. Vascular:  [] Pain in legs with walking   [] Pain in legs at rest   [] Pain in legs when laying flat   [] Claudication   [] Pain in feet when walking  [] Pain in feet at rest  [] Pain in feet when laying flat   [] History of DVT   [] Phlebitis   [] Swelling in legs   [] Varicose veins   [] Non-healing ulcers Pulmonary:   [] Uses home oxygen   [] Productive cough   [] Hemoptysis   [] Wheeze  [] COPD   [] Asthma Neurologic:  [] Dizziness  [] Blackouts   [] Seizures   [] History of stroke   [] History of TIA  [] Aphasia   [] Temporary blindness   [] Dysphagia   [] Weakness or numbness in arms   [] Weakness or numbness in legs Musculoskeletal:  [] Arthritis   [] Joint swelling   [] Joint pain   [] Low back pain Hematologic:  [] Easy bruising  [] Easy bleeding   [] Hypercoagulable state   [] Anemic  [] Hepatitis Gastrointestinal:  [] Blood in stool   [] Vomiting blood  [] Gastroesophageal reflux/heartburn   [] Difficulty swallowing. Genitourinary:  [x] Chronic kidney disease   [] Difficult urination  [] Frequent urination  [] Burning with urination   [] Blood in urine Skin:  [] Rashes   [] Ulcers   [] Wounds Psychological:  [] History of anxiety   []  History of major depression.  Physical Examination  There were no vitals filed for this visit. There is no height or weight on file to calculate BMI. Gen: WD/WN, NAD Head: [place]/AT, No temporalis wasting. Prominent temp pulse not noted. Ear/Nose/Throat:  Hearing grossly intact, nares w/o erythema or drainage, oropharynx w/o Erythema/Exudate,  Eyes: Sclera nonicteric, conjunctivae clear Neck: Supple, no nuchal rigidity.  No JVD.  Pulmonary:  Good air movement, no use of accessory muscles.  Cardiac: RRR, normal S1, S2. Vascular: A/V fistula is pulsatile in nature Vessel Right Left  Radial Palpable Palpable                                   Gastrointestinal: soft, non-tender/non-distended. No guarding/reflex.  Musculoskeletal: M/S 5/5 throughout.  Extremities without ischemic changes.  No deformity or atrophy.  Neurologic: CN 2-12 intact. Pain and light touch intact in extremities.  Symmetrical.  Speech is fluent. Motor exam as listed above. Psychiatric: Judgment intact, Mood & affect appropriate for pt's clinical situation. Dermatologic: No rashes or ulcers noted.  No cellulitis or open  wounds. Lymph : No Cervical, Axillary, or Inguinal lymphadenopathy.      CBC Lab Results  Component Value Date   WBC 6.4 02/17/2015   HGB 14.0 02/17/2015   HCT 43.9 02/17/2015   MCV 97.1 02/17/2015   PLT 231 02/17/2015    BMET    Component Value Date/Time   NA 138 02/17/2015 0936   NA 135 (L) 07/04/2013 1256   K 3.7 02/17/2015 0936   K 4.4 07/11/2013 1108   CL 95 (L) 02/17/2015 0936   CL 96 (L) 07/04/2013 1256   CO2 27 02/17/2015 0936   CO2 32 07/04/2013 1256   GLUCOSE 180 (H) 02/17/2015 0936   GLUCOSE 189 (H) 07/04/2013 1256   BUN 32 (H) 02/17/2015 0936   BUN 26 (H) 07/04/2013 1256   CREATININE 11.54 (H) 02/17/2015 0936   CREATININE 10.86 (H) 07/04/2013 1256   CALCIUM 8.8 (L) 02/17/2015 0936   CALCIUM 8.8 07/04/2013 1256   GFRNONAA 4 (L) 02/17/2015 0936   GFRNONAA 4 (L) 07/04/2013 1256   GFRAA 5 (L) 02/17/2015 0936   GFRAA 5 (L) 07/04/2013 1256   CrCl cannot be calculated (Unknown ideal weight.).  COAG No results found for: INR, PROTIME  Radiology No results found.    Assessment/Plan 1. Complication of dialysis  access. Patient has a pulsatile AV fistula and needs this as a backup option as he is likely to outlive his transplant. Fistulogram today with a limited amount of contrast. 2. End-stage renal disease. Status post cadaveric renal transplant and doing well. 3. Hypertension. Stable. On outpatient medications.   Leotis Pain, MD  08/08/2016 1:11 PM

## 2016-08-08 NOTE — Op Note (Signed)
[facility]    OPERATIVE NOTE   PROCEDURE: 1.   Left brachiocephalic arteriovenous fistula cannulation under ultrasound guidance 2.   Left arm fistulagram including central venogram 3.   Percutaneous transluminal angioplasty of the cephalic vein subclavian vein confluence with 8 mm diameter drug-coated angioplasty balloon and 7 mm diameter high pressure angioplasty balloon  PRE-OPERATIVE DIAGNOSIS: 1. ESRD, status post cadaveric renal transplant 2. Aneurysmal, pulsatile left arm AV fistula  POST-OPERATIVE DIAGNOSIS: same as above   SURGEON: Leotis Pain, MD  ANESTHESIA: local with MCS  ESTIMATED BLOOD LOSS: 10 cc  FINDING(S): 1. 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 male who presents with malfunctioning  left brachiocephalic arteriovenous fistula with worsening aneurysmal degeneration and a pulsatile exam.  The patient is scheduled for  left arm fistulagram.  The patient is aware the risks include but are not limited to: bleeding, infection, thrombosis of the cannulated access, and possible anaphylactic reaction to the contrast.  The patient is aware of the risks of the procedure and elects to proceed forward.  DESCRIPTION: After full informed written consent was obtained, the patient was brought back to the angiography suite and placed supine upon the angiography table.  The patient was connected to monitoring equipment. Moderate conscious sedation was administered with a face to face encounter with the patient throughout the procedure with my supervision of the RN administering medicines and monitoring the patient's vital signs and  mental status throughout from the start of the procedure until the patient was taken to the recovery room. The  left arm was prepped and draped in the standard fashion for a percutaneous access intervention.  Under ultrasound guidance, the  left brachiocephalic arteriovenous fistula was cannulated with a micropuncture needle under direct ultrasound guidance and a permanent image was performed.  The microwire was advanced into the fistula and the needle was exchanged for the a microsheath.  I then upsized to a 6 Fr Sheath and imaging was performed.  Hand injections were completed to image the access including the central venous system. This demonstrated 85-90% stenosis of the cephalic vein at the cephalic vein subclavian vein confluence with an otherwise patent left brachiocephalic AV fistula.  Based on the images, this patient will need intervention to this area. I then gave the patient 3000 units of intravenous heparin.  I then crossed the stenosis with a Magic Tourqe wire.  Based on the imaging, a 8 mm x 6 cm  Lutonix drug-coated angioplasty balloon was selected.  The balloon was centered around the cephalic vein/subclavian vein confluence stenosis and inflated to 14 ATM for 1 minute(s).  The waist never broke even at burst pressure, so I elected to use a high pressure angioplasty balloon. A 7 mm diameter by 6 cm length high pressure angioplasty balloon was then selected. This is placed across the stenosis and inflated to 32 atm before the waist broke. This was held for 1 minute. On completion imaging, a 30-35 % residual stenosis was present that did not appear flow limiting.     Based on the completion imaging, no further intervention is necessary.  The wire and balloon were removed from the sheath.  A 4-0 Monocryl purse-string suture was sewn around the sheath.  The sheath was removed while tying down the suture.  A sterile bandage was applied to the puncture site.  COMPLICATIONS: None  CONDITION:  Stable   Leotis Pain  08/08/2016 3:20 PM   This note was created with Dragon Medical transcription system. Any errors in dictation are purely unintentional.

## 2016-08-09 ENCOUNTER — Encounter: Payer: Self-pay | Admitting: Vascular Surgery

## 2016-09-16 DIAGNOSIS — H532 Diplopia: Secondary | ICD-10-CM | POA: Insufficient documentation

## 2016-09-20 ENCOUNTER — Other Ambulatory Visit (INDEPENDENT_AMBULATORY_CARE_PROVIDER_SITE_OTHER): Payer: Self-pay | Admitting: Vascular Surgery

## 2016-09-20 DIAGNOSIS — N186 End stage renal disease: Secondary | ICD-10-CM

## 2016-09-20 DIAGNOSIS — Z992 Dependence on renal dialysis: Principal | ICD-10-CM

## 2016-09-23 ENCOUNTER — Encounter (INDEPENDENT_AMBULATORY_CARE_PROVIDER_SITE_OTHER): Payer: Medicare Other | Admitting: Vascular Surgery

## 2016-09-23 ENCOUNTER — Encounter (INDEPENDENT_AMBULATORY_CARE_PROVIDER_SITE_OTHER): Payer: Medicare Other

## 2016-10-05 ENCOUNTER — Ambulatory Visit (INDEPENDENT_AMBULATORY_CARE_PROVIDER_SITE_OTHER): Payer: Medicare Other | Admitting: Podiatry

## 2016-10-05 ENCOUNTER — Encounter: Payer: Self-pay | Admitting: Podiatry

## 2016-10-05 VITALS — BP 95/41 | HR 64 | Resp 18

## 2016-10-05 DIAGNOSIS — M79674 Pain in right toe(s): Secondary | ICD-10-CM | POA: Diagnosis not present

## 2016-10-05 DIAGNOSIS — B351 Tinea unguium: Secondary | ICD-10-CM

## 2016-10-05 DIAGNOSIS — M79675 Pain in left toe(s): Secondary | ICD-10-CM

## 2016-10-05 NOTE — Patient Instructions (Signed)

## 2016-10-05 NOTE — Progress Notes (Signed)
Patient ID: [name], male   DOB: 05/25/1946, 70 y.o.   MRN: [id number]    Subjective: This patient presents today again complaining of thickened and elongated toenails which are on call for walking wearing shoes and requests toenail debridement.  Patient is a history of diabetes with the history of kidney transplant Denies history of claudication, amputation of foot ulceration  Objective: Orientated 3  Vascular: No peripheral edema bilaterally DP and PT pulses 2/4 bilaterally Capillary reflex immediate bilaterally  Neurological: Sensation to 10 g monofilament wire intact 5/5 bilaterally Vibratory sensation reactive bilaterally Ankle reflex equal and reactive bilaterally  Dermatological: No open skin lesions bilaterally The toenails are extremely hypertrophic, elongated, discolored, deformed and tender to direct palpation 6-10  Musculoskeletal: Pes planus bilaterally HAV bilaterally There is no restriction ankle, subtalar, midtarsal joints bilaterally  Assessment: Satisfactory neurovascular status 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

## 2016-11-18 ENCOUNTER — Encounter (INDEPENDENT_AMBULATORY_CARE_PROVIDER_SITE_OTHER): Payer: Self-pay | Admitting: Vascular Surgery

## 2016-11-18 ENCOUNTER — Ambulatory Visit (INDEPENDENT_AMBULATORY_CARE_PROVIDER_SITE_OTHER): Payer: Medicare Other

## 2016-11-18 ENCOUNTER — Ambulatory Visit (INDEPENDENT_AMBULATORY_CARE_PROVIDER_SITE_OTHER): Payer: Medicare Other | Admitting: Vascular Surgery

## 2016-11-18 VITALS — BP 145/76 | HR 67 | Resp 16 | Wt 228.4 lb

## 2016-11-18 DIAGNOSIS — N186 End stage renal disease: Secondary | ICD-10-CM

## 2016-11-18 DIAGNOSIS — I10 Essential (primary) hypertension: Secondary | ICD-10-CM | POA: Diagnosis not present

## 2016-11-18 DIAGNOSIS — E1122 Type 2 diabetes mellitus with diabetic chronic kidney disease: Secondary | ICD-10-CM | POA: Diagnosis not present

## 2016-11-18 DIAGNOSIS — Z992 Dependence on renal dialysis: Secondary | ICD-10-CM

## 2016-11-18 NOTE — Assessment & Plan Note (Signed)
blood pressure control important in reducing the progression of atherosclerotic disease. On appropriate oral medications.  

## 2016-11-18 NOTE — Assessment & Plan Note (Signed)
Duplex shows that his AV fistula is patent with mild narrowing at the cephalic vein subclavian vein confluence which has been stable from previous studies. I would continue to use this access as current. I will plan to see him back in 6 months for repeat ultrasound given the mild stenosis seen on studies. He will contact our office with any problems in the interim.

## 2016-11-18 NOTE — Assessment & Plan Note (Signed)
blood glucose control important in reducing the progression of atherosclerotic disease. Also, involved in wound healing. On appropriate medications.  

## 2016-11-18 NOTE — Progress Notes (Signed)
MRN : [id number]  [name] is a 71 y.o. (03/25/1946) male who presents with chief complaint of  Chief Complaint  Patient presents with  . Follow-up  .  History of Present Illness: Patient returns today in follow up of Dialysis access for renal failure. He reports no major changes or problems since his last visit. He says his access is working well, but the tacks at the Center did not like how his access felt. He was sent for evaluation. Duplex shows that his AV fistula is patent with mild narrowing at the cephalic vein subclavian vein confluence which has been stable from previous studies.  Current Outpatient Prescriptions  Medication Sig Dispense Refill  . acetaminophen (TYLENOL) 325 MG tablet Take 650 mg by mouth every 6 (six) hours as needed (pain).     [name] allopurinol (ZYLOPRIM) 300 MG tablet Take 300 mg by mouth daily with breakfast.     . amLODipine (NORVASC) 5 MG tablet Take 5 mg by mouth daily with breakfast.     . aspirin 81 MG chewable tablet Chew 81 mg by mouth daily.    . B Complex-C-Folic Acid (DIALYVITE 932) 0.8 MG TABS Take 0.8 mg by mouth daily with breakfast.     . bacitracin-polymyxin b (POLYSPORIN) ophthalmic ointment Place 1 application into the left eye 3 (three) times daily. apply to eye every 8 hours while awake 3.5 g 0  . brimonidine (ALPHAGAN) 0.2 % ophthalmic solution Place 1 drop into the left eye 2 (two) times daily. 5 mL 12  . brimonidine-timolol (COMBIGAN) 0.2-0.5 % ophthalmic solution Place 1 drop into the right eye 2 (two) times daily.     . brinzolamide (AZOPT) 1 % ophthalmic suspension Place 1 drop into the right eye 3 (three) times daily.    . brinzolamide (AZOPT) 1 % ophthalmic suspension Place 1 drop into the right eye 3 (three) times daily. 10 mL 12  . cinacalcet (SENSIPAR) 90 MG tablet Take 90 mg by mouth daily with supper.     . cloNIDine (CATAPRES) 0.1 MG tablet Take 0.1 mg by mouth 2 (two) times daily.    [name] doxazosin (CARDURA) 8 MG  tablet Take 4 mg by mouth daily with breakfast.     . famotidine (PEPCID) 20 MG tablet Take 20 mg by mouth 2 (two) times daily.    . furosemide (LASIX) 40 MG tablet Take 40 mg by mouth 2 (two) times daily.    [name] gatifloxacin (ZYMAXID) 0.5 % SOLN Place 1 drop into the left eye 4 (four) times daily.    [name] glimepiride (AMARYL) 4 MG tablet Take 4 mg by mouth daily with breakfast.     . HYDROcodone-acetaminophen (NORCO/VICODIN) 5-325 MG per tablet Take 1-2 tablets by mouth every 4 (four) hours as needed for moderate pain. 30 tablet 0  . latanoprost (XALATAN) 0.005 % ophthalmic solution Place 1 drop into the right eye at bedtime.     [name] linagliptin (TRADJENTA) 5 MG TABS tablet Take 5 mg by mouth daily with breakfast.     . losartan (COZAAR) 100 MG tablet Take 100 mg by mouth daily.    . metoprolol (LOPRESSOR) 50 MG tablet Take 25 mg by mouth 2 (two) times daily.    . nepafenac (ILEVRO) 0.3 % ophthalmic suspension Place 1 drop into the left eye at bedtime.    . prednisoLONE acetate (PRED FORTE) 1 % ophthalmic suspension Place 1 drop into the left eye 4 (four) times daily. 5 mL 0  .  sodium chloride (OCEAN) 0.65 % SOLN nasal spray Place 2 sprays into both nostrils daily as needed for congestion.     . sucroferric oxyhydroxide (VELPHORO) 500 MG chewable tablet Chew 500 mg by mouth 3 (three) times daily with meals.    [name] sulfamethoxazole-trimethoprim (BACTRIM) 400-80 MG per tablet Take 1 tablet by mouth 2 (two) times daily. 10 tablet 0  . tadalafil (CIALIS) 20 MG tablet Take 20 mg by mouth once a week. Monday evenings after supper    . timolol (TIMOPTIC) 0.5 % ophthalmic solution Place 1 drop into the right eye 2 (two) times daily. 10 mL 12   No current facility-administered medications for this visit.     Past Medical History:  Diagnosis Date  . Anemia    hx of at beginnning of dialysis- 05/2009  . Arthritis    right hip   . End stage renal disease ([place])    [place]   .  End stage renal disease on dialysis ([place])   . Essential hypertension, malignant   . Hypertrophy of prostate without urinary obstruction and other lower urinary tract symptoms (LUTS)   . Pure hypercholesterolemia   . Renal insufficiency   . Type II or unspecified type diabetes mellitus without mention of complication, not stated as uncontrolled    TYPE 2     Past Surgical History:  Procedure Laterality Date  . CARDIAC CATHETERIZATION  04/29/10   normal coronaries, LVEF 45%, mild global LV hypokinesis ([place])  . CATARACT EXTRACTION W/PHACO Right 04/03/2013   Procedure: CATARACT EXTRACTION PHACO AND INTRAOCULAR LENS PLACEMENT (IOC);  Surgeon: Adonis Brook, MD;  Location: [facility];  Service: Ophthalmology;  Laterality: Right;  . CERVICAL DISCECTOMY  03/1999  . COLONOSCOPY     Hx: of  . CYSTOSCOPY WITH BIOPSY  11/01/2012   Procedure: CYSTOSCOPY WITH BIOPSY;  Surgeon: Molli Hazard, MD;  Location: [facility];  Service: Urology;  Laterality: N/A;  with fulgeration  . CYSTOSCOPY WITH RETROGRADE PYELOGRAM, URETEROSCOPY AND STENT PLACEMENT  11/01/2012   Procedure: CYSTOSCOPY WITH RETROGRADE PYELOGRAM, URETEROSCOPY AND STENT PLACEMENT;  Surgeon: Molli Hazard, MD;  Location: [facility];  Service: Urology;  Laterality: Bilateral;  . DIALYSIS FISTULA CREATION  2010  . EYE SURGERY  2012   cataracts  . EYE SURGERY  11/2014  . HOLMIUM LASER APPLICATION  5/59/7416   Procedure: HOLMIUM LASER APPLICATION;  Surgeon: Molli Hazard, MD;  Location: [facility];  Service: Urology;  Laterality: Bilateral;  . KIDNEY TRANSPLANT  2016  . MEMBRANE PEEL Right 09/04/2013   Procedure: MEMBRANE PEEL;  Surgeon: Adonis Brook, MD;  Location: [facility];  Service: Ophthalmology;  Laterality: Right;  . NECK SURGERY  1990  . PARS PLANA VITRECTOMY Right 09/04/2013   Procedure: PARS PLANA VITRECTOMY WITH 23 GAUGE;  Surgeon: Adonis Brook, MD;  Location: [facility];  Service: Ophthalmology;  Laterality: Right;  . PARS  PLANA VITRECTOMY Left 02/17/2015  . PARS PLANA VITRECTOMY Left 02/17/2015   Procedure: PARS PLANA VITRECTOMY WITH 25G REMOVAL/SUTURE INTRAOCULAR LENS;  Surgeon: Hayden Pedro, MD;  Location: [facility];  Service: Ophthalmology;  Laterality: Left;  . PERIPHERAL VASCULAR CATHETERIZATION Left 08/08/2016   Procedure: A/V Shuntogram/Fistulagram;  Surgeon: Algernon Huxley, MD;  Location: [facility];  Service: Cardiovascular;  Laterality: Left;  . PERIPHERAL VASCULAR CATHETERIZATION N/A 08/08/2016   Procedure: A/V Shunt Intervention;  Surgeon: Algernon Huxley, MD;  Location: [facility];  Service: Cardiovascular;  Laterality: N/A;  .  PHOTOCOAGULATION WITH LASER Right 09/04/2013   Procedure: PHOTOCOAGULATION WITH LASER;  Surgeon: Adonis Brook, MD;  Location: [facility];  Service: Ophthalmology;  Laterality: Right;  ENDOLASER  . TRANSTHORACIC ECHOCARDIOGRAM  06/19/2012   normal LV sys function, EF 55-60%, mild to mod diastolic dysfunction, mild AI ([place])    Social History Social History  Substance Use Topics  . Smoking status: Former Smoker    Types: Cigarettes    Quit date: 10/02/1990  . Smokeless tobacco: Never Used  . Alcohol use Yes     Comment: occasional     Family History Family History  Problem Relation Age of Onset  . Breast cancer Mother 35    deceased  . Pneumonia Father 52    deceased  . Hypertension Brother   . Diabetes Mellitus II Brother     Allergies  Allergen Reactions  . Codeine Nausea And Vomiting  . Doxycycline Nausea And Vomiting  . Latex Hives     REVIEW OF SYSTEMS (Negative unless checked)  Constitutional: [] Weight loss  [] Fever  [] Chills Cardiac: [] Chest pain   [] Chest pressure   [] Palpitations   [] Shortness of breath when laying flat   [] Shortness of breath at rest   [] Shortness of breath with exertion. Vascular:  [] Pain in legs with walking   [] Pain in legs at rest   [] Pain in legs when laying flat   [] Claudication   [] Pain in feet when  walking  [] Pain in feet at rest  [] Pain in feet when laying flat   [] History of DVT   [] Phlebitis   [] Swelling in legs   [] Varicose veins   [] Non-healing ulcers Pulmonary:   [] Uses home oxygen   [] Productive cough   [] Hemoptysis   [] Wheeze  [] COPD   [] Asthma Neurologic:  [] Dizziness  [] Blackouts   [] Seizures   [] History of stroke   [] History of TIA  [] Aphasia   [] Temporary blindness   [] Dysphagia   [] Weakness or numbness in arms   [] Weakness or numbness in legs Musculoskeletal:  [x] Arthritis   [] Joint swelling   [] Joint pain   [] Low back pain Hematologic:  [] Easy bruising  [] Easy bleeding   [] Hypercoagulable state   [] Anemic   Gastrointestinal:  [] Blood in stool   [] Vomiting blood  [] Gastroesophageal reflux/heartburn   [] Abdominal pain Genitourinary:  [x] Chronic kidney disease   [] Difficult urination  [] Frequent urination  [] Burning with urination   [] Hematuria Skin:  [] Rashes   [] Ulcers   [] Wounds Psychological:  [] History of anxiety   []  History of major depression.  Physical Examination  BP (!) 145/76   Pulse 67   Resp 16   Wt 228 lb 6.4 oz (103.6 kg)   BMI 30.98 kg/m  Gen:  WD/WN, NAD Head: [place]/AT, No temporalis wasting. Ear/Nose/Throat: Hearing grossly intact, nares w/o erythema or drainage, trachea midline Eyes: Conjunctiva clear. Sclera non-icteric Neck: Supple.  No JVD.  Pulmonary:  Good air movement, no use of accessory muscles.  Cardiac: RRR, normal S1, S2 Vascular: Thrill is present and aneurysmal left arm AV fistula. Vessel Right Left  Radial Palpable Palpable                                   Gastrointestinal: soft, non-tender/non-distended. No guarding/reflex.  Musculoskeletal: M/S 5/5 throughout.  No deformity or atrophy.  Neurologic: Sensation grossly intact in extremities.  Symmetrical.  Speech is fluent.  Psychiatric: Judgment intact, Mood & affect appropriate for pt's clinical situation. Dermatologic: No  rashes or ulcers noted.  No cellulitis or open  wounds. Lymph : No Cervical, Axillary, or Inguinal lymphadenopathy.      Labs No results found for this or any previous visit (from the past 2160 hour(s)).  Radiology No results found.    Assessment/Plan  Diabetes mellitus blood glucose control important in reducing the progression of atherosclerotic disease. Also, involved in wound healing. On appropriate medications.   HTN (hypertension) blood pressure control important in reducing the progression of atherosclerotic disease. On appropriate oral medications.   ESRD (end stage renal disease) on dialysis Duplex shows that his AV fistula is patent with mild narrowing at the cephalic vein subclavian vein confluence which has been stable from previous studies. I would continue to use this access as current. I will plan to see him back in 6 months for repeat ultrasound given the mild stenosis seen on studies. He will contact our office with any problems in the interim.    Leotis Pain, MD  11/18/2016 3:57 PM    This note was created with Dragon medical transcription system.  Any errors from dictation are purely unintentional

## 2017-01-04 ENCOUNTER — Ambulatory Visit: Payer: Medicare Other | Admitting: Podiatry

## 2017-02-21 ENCOUNTER — Ambulatory Visit (INDEPENDENT_AMBULATORY_CARE_PROVIDER_SITE_OTHER): Payer: Medicare Other | Admitting: Podiatry

## 2017-02-21 ENCOUNTER — Encounter: Payer: Self-pay | Admitting: Podiatry

## 2017-02-21 DIAGNOSIS — B351 Tinea unguium: Secondary | ICD-10-CM | POA: Diagnosis not present

## 2017-02-21 DIAGNOSIS — M79674 Pain in right toe(s): Secondary | ICD-10-CM

## 2017-02-21 DIAGNOSIS — E119 Type 2 diabetes mellitus without complications: Secondary | ICD-10-CM

## 2017-02-21 DIAGNOSIS — M79675 Pain in left toe(s): Secondary | ICD-10-CM

## 2017-02-21 NOTE — Progress Notes (Signed)
Patient ID: [name], male   DOB: 1946/08/14, 71 y.o.   MRN: [id number]    Subjective: This patient presents today again complaining of thickened and elongated toenails which are on call for walking wearing shoes and requests toenail debridement. The patient's daughter is present in the treatment room today.  Patient is a history of diabetes with the history of kidney transplant Denies history of claudication, amputation of foot ulceration  Objective: Orientated 3  Vascular: No peripheral edema bilaterally DP and PT pulses 2/4 bilaterally Capillary reflex immediate bilaterally  Neurological: Sensation to 10 g monofilament wire intact 5/5 bilaterally Vibratory sensation reactive bilaterally Ankle reflex equal and reactive bilaterally  Dermatological: Absent hair growth bilaterally No open skin lesions bilaterally The toenails are extremely hypertrophic, elongated, discolored, deformed and tender to direct palpation 6-10  Musculoskeletal: Pes planus bilaterally HAV bilaterally There is no restriction ankle, subtalar, midtarsal joints bilaterally  Assessment: Satisfactory neurovascular status 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

## 2017-02-21 NOTE — Patient Instructions (Signed)

## 2017-05-19 ENCOUNTER — Ambulatory Visit (INDEPENDENT_AMBULATORY_CARE_PROVIDER_SITE_OTHER): Payer: Medicare Other | Admitting: Vascular Surgery

## 2017-05-19 ENCOUNTER — Encounter (INDEPENDENT_AMBULATORY_CARE_PROVIDER_SITE_OTHER): Payer: Medicare Other

## 2017-05-24 ENCOUNTER — Encounter: Payer: Self-pay | Admitting: Podiatry

## 2017-05-24 ENCOUNTER — Ambulatory Visit (INDEPENDENT_AMBULATORY_CARE_PROVIDER_SITE_OTHER): Payer: Medicare Other | Admitting: Podiatry

## 2017-05-24 DIAGNOSIS — M79674 Pain in right toe(s): Secondary | ICD-10-CM

## 2017-05-24 DIAGNOSIS — B351 Tinea unguium: Secondary | ICD-10-CM | POA: Diagnosis not present

## 2017-05-24 DIAGNOSIS — M79675 Pain in left toe(s): Secondary | ICD-10-CM | POA: Diagnosis not present

## 2017-05-24 DIAGNOSIS — E119 Type 2 diabetes mellitus without complications: Secondary | ICD-10-CM | POA: Diagnosis not present

## 2017-05-24 NOTE — Progress Notes (Signed)
Patient ID: [name], male   DOB: 04/17/1946, 71 y.o.   MRN: [id number]    Subjective: This patient presents today again complaining of thickened and elongated toenails which are on call for walking wearing shoes and requests toenail debridement. The patient's daughter is present in the treatment room today.  Patient is a history of diabetes with the history of kidney transplant Denies history of claudication, amputation of foot ulceration  Objective: Orientated 3  Vascular: No peripheral edema bilaterally DP and PT pulses 2/4 bilaterally Capillary reflex immediate bilaterally  Neurological: Sensation to 10 g monofilament wire intact 5/5 bilaterally Vibratory sensation reactive bilaterally Ankle reflex equal and reactive bilaterally  Dermatological: Absent hair growth bilaterally No open skin lesions bilaterally The toenails are extremely hypertrophic, elongated, discolored, deformed and tender to direct palpation 6-10  Musculoskeletal: Pes planus bilaterally HAV bilaterally There is no restriction ankle, subtalar, midtarsal joints bilaterally  Assessment: Satisfactory neurovascular status 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

## 2017-05-24 NOTE — Patient Instructions (Signed)

## 2017-06-09 ENCOUNTER — Encounter (INDEPENDENT_AMBULATORY_CARE_PROVIDER_SITE_OTHER): Payer: Self-pay | Admitting: Vascular Surgery

## 2017-06-09 ENCOUNTER — Ambulatory Visit (INDEPENDENT_AMBULATORY_CARE_PROVIDER_SITE_OTHER): Payer: Medicare Other | Admitting: Vascular Surgery

## 2017-06-09 ENCOUNTER — Other Ambulatory Visit (INDEPENDENT_AMBULATORY_CARE_PROVIDER_SITE_OTHER): Payer: Self-pay

## 2017-06-09 ENCOUNTER — Ambulatory Visit (INDEPENDENT_AMBULATORY_CARE_PROVIDER_SITE_OTHER): Payer: Medicare Other

## 2017-06-09 ENCOUNTER — Encounter (INDEPENDENT_AMBULATORY_CARE_PROVIDER_SITE_OTHER): Payer: Self-pay

## 2017-06-09 VITALS — BP 130/78 | HR 55 | Resp 16 | Wt 226.0 lb

## 2017-06-09 DIAGNOSIS — I10 Essential (primary) hypertension: Secondary | ICD-10-CM

## 2017-06-09 DIAGNOSIS — N186 End stage renal disease: Secondary | ICD-10-CM

## 2017-06-09 DIAGNOSIS — Z992 Dependence on renal dialysis: Secondary | ICD-10-CM

## 2017-06-09 DIAGNOSIS — E1122 Type 2 diabetes mellitus with diabetic chronic kidney disease: Secondary | ICD-10-CM | POA: Diagnosis not present

## 2017-06-09 NOTE — Patient Instructions (Signed)
End-Stage Kidney Disease °End-stage kidney disease occurs when the kidneys are so damaged that they cannot do their job. The kidneys are two organs that do many important jobs in the body, which include: °· Removing wastes and extra fluids from the blood. °· Making hormones that maintain the amount of fluid in your tissues and blood vessels. °· Maintaining the right amount of fluids and chemicals in the body. ° °When the kidneys are damaged and cannot do their job, life-threatening problems occur. Without the help of the kidneys, toxins build up in the blood. In end-stage kidney disease, the kidneys cannot get better. °What are the causes? °End-stage kidney disease usually occurs when a long-lasting (chronic) kidney disease gets worse. It may also occur after the kidneys are suddenly damaged (acute kidney injury). °What increases the risk? °This condition is more likely to develop in people who are: °· Older than age 60. °· Male. °· Of African-American descent. °· Current smokers or former smokers. °· Obese. ° °You may also have an increased risk for end-stage kidney disease if you: °· Have a family history of chronic kidney disease (CKD). °· Have had kidney disease for many years. °· Have other longstanding medical conditions that affect the kidneys, such as: °? Cardiovascular disease including high blood pressure. °? Diabetes. °? Certain diseases that affect the immune system. ° °What are the signs or symptoms? °· Swelling (edema) of the face, legs, ankles, or feet. °· Numbness, tingling, or loss of feeling (sensation) in your hands or feet. °· Tiredness (lethargy). °· Nausea or vomiting. °· Confusion, trouble concentrating, or loss of consciousness. °· Chest pain. °· Shortness of breath. °· Little to no urine production. °· Muscle twitches and cramps, especially in the legs. °· Constant itchiness. °· Loss of appetite. °· Pale skin and tissue lining your eyelids (conjunctiva). °· Headaches. °· Abnormally dark or  light skin. °· Decrease in muscle size (muscle wasting). °· Easy bruising. °· Frequent hiccups. °· Stopping of menstruation in women. °· Seizures. °How is this diagnosed? °Your health care provider will measure your blood pressure and do some tests. These may include: °· Urine tests. °· Blood tests. °· Imaging tests. °· A test in which a sample of tissue is removed from the kidneys to be looked at under a microscope (kidney biopsy). ° °How is this treated? °There are two treatments for end-stage kidney disease: °· A procedure that removes toxic wastes from the body (dialysis). Depending on the type of dialysis you choose, it may be performed more than one time a day (peritoneal dialysis) or several times a week (hemodialysis). °· Surgery to receive a new kidney (kidney transplant). ° °In addition to having dialysis or a kidney transplant, you may need to take medicines: °· To control high blood pressure (hypertension). °· To control cholesterol. °· To maintain healthy electrolyte levels in your blood. ° °You may also be given a specific diet to follow that includes requirements or limits for: °· Salt (sodium). °· Protein. °· Phosphorous. °· Potassium. °· Calcium. ° °Follow these instructions at home: °· Follow your prescribed diet. °· Take over-the-counter and prescription medicines only as told by your health care provider. °? Do not take any new medicines unless approved by your health care provider. Many medicines can worsen your kidney damage. °? Do not take any vitamin and mineral supplements unless approved by your health care provider. Many nutritional supplements can worsen your kidney damage. °? The dose of some medicines that you take may need to be   adjusted. °· Do not use any tobacco products, such as cigarettes, chewing tobacco, and e-cigarettes. If you need help quitting, ask your health care provider. °· Keep all follow-up visits as told by your health care provider. This is important. °· Keep track of  your blood pressure. Report changes in your blood pressure as told by your health care provider. °· Achieve and maintain a healthy weight. If you need help with this, ask your health care provider. °· Start or continue an exercise plan. Try to exercise at least 30 minutes a day, 5 days a week. °· Stay current with immunizations as told by your health care provider. °Where to find more information: °· American Association of Kidney Patients: www.aakp.org °· National Kidney Foundation: www.kidney.org °· American Kid[employer] Fund: www.akfinc.org °· [employer] [facility]: www.lifeoptions.org and www.kidneyschool.org °Contact a health care provider if: °· Your symptoms get worse. °· You develop new symptoms. °Get help right away if: °· You have weakness in an arm or leg on one side of your body. °· You have difficulty speaking or you are slurring your speech. °· You have a sudden change in your vision. °· You have a sudden, severe headache. °· You have a sudden weight increase. °· You have difficulty breathing. °· Your symptoms suddenly get worse. °This information is not intended to replace advice given to you by your health care provider. Make sure you discuss any questions you have with your health care provider. °Document Released: 12/24/2003 Document Revised: 03/10/2016 Document Reviewed: 06/01/2012 °Elsevier Interactive Patient Education © 2017 [employer] ° °

## 2017-06-09 NOTE — Assessment & Plan Note (Signed)
His duplex today shows markedly elevated velocities at the cephalic vein subclavian vein confluence with associated thrombus in this area. There is low flow through the fistula. 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 Even though he now has a kidney transplant and is not on dialysis, this is his backup access option and it is certainly at risk of thrombosis without intervention. I think we can do an intervention with a very limited amount of contrast and would recommend that at this point. I discussed the risks and benefits the procedure. I have told him if the fistula thrombosis, we would not likely be able to salvage a due to the aneurysmal nature of it. He voices his understanding and is agreeable with our plan of care.

## 2017-06-09 NOTE — Assessment & Plan Note (Signed)
blood pressure control important in reducing the progression of atherosclerotic disease. On appropriate oral medications.  

## 2017-06-09 NOTE — Progress Notes (Signed)
MRN : [id number]  [name] is a 71 y.o. (20-Feb-1946) male who presents with chief complaint of  Chief Complaint  Patient presents with  . ultrasound follow up  .  History of Present Illness: Patient returns today in follow up of his left arm AVF. He has gotten a kidney transplant and the access has not had to be used in some time now. The access has become more aneurysmal. His duplex today shows markedly elevated velocities at the cephalic vein subclavian vein confluence with associated thrombus in this area. There is low flow through the fistula. 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  Current Outpatient Prescriptions  Medication Sig Dispense Refill  . acetaminophen (TYLENOL) 325 MG tablet Take 650 mg by mouth every 6 (six) hours as needed (pain).     [name] allopurinol (ZYLOPRIM) 300 MG tablet Take 300 mg by mouth daily with breakfast.     . amLODipine (NORVASC) 5 MG tablet Take 5 mg by mouth daily with breakfast.     . aspirin 81 MG chewable tablet Chew 81 mg by mouth daily.    . B Complex-C-Folic Acid (DIALYVITE 888) 0.8 MG TABS Take 0.8 mg by mouth daily with breakfast.     . bacitracin-polymyxin b (POLYSPORIN) ophthalmic ointment Place 1 application into the left eye 3 (three) times daily. apply to eye every 8 hours while awake 3.5 g 0  . brimonidine (ALPHAGAN) 0.2 % ophthalmic solution Place 1 drop into the left eye 2 (two) times daily. 5 mL 12  . brimonidine-timolol (COMBIGAN) 0.2-0.5 % ophthalmic solution Place 1 drop into the right eye 2 (two) times daily.     . brinzolamide (AZOPT) 1 % ophthalmic suspension Place 1 drop into the right eye 3 (three) times daily.    . brinzolamide (AZOPT) 1 % ophthalmic suspension Place 1 drop into the right eye 3 (three) times daily. 10 mL 12  . cinacalcet (SENSIPAR) 90 MG tablet Take 90 mg by mouth daily with supper.     . cloNIDine (CATAPRES) 0.1 MG tablet Take 0.1 mg by mouth 2 (two) times daily.    [name]  doxazosin (CARDURA) 8 MG tablet Take 4 mg by mouth daily with breakfast.     . famotidine (PEPCID) 20 MG tablet Take 20 mg by mouth 2 (two) times daily.    . furosemide (LASIX) 40 MG tablet Take 40 mg by mouth 2 (two) times daily.    [name] gatifloxacin (ZYMAXID) 0.5 % SOLN Place 1 drop into the left eye 4 (four) times daily.    [name] glimepiride (AMARYL) 4 MG tablet Take 4 mg by mouth daily with breakfast.     . HYDROcodone-acetaminophen (NORCO/VICODIN) 5-325 MG per tablet Take 1-2 tablets by mouth every 4 (four) hours as needed for moderate pain. 30 tablet 0  . latanoprost (XALATAN) 0.005 % ophthalmic solution Place 1 drop into the right eye at bedtime.     [name] linagliptin (TRADJENTA) 5 MG TABS tablet Take 5 mg by mouth daily with breakfast.     . losartan (COZAAR) 100 MG tablet Take 100 mg by mouth daily.    . metoprolol (LOPRESSOR) 50 MG tablet Take 25 mg by mouth 2 (two) times daily.    . nepafenac (ILEVRO) 0.3 % ophthalmic suspension Place 1 drop into the left eye at bedtime.    . prednisoLONE acetate (PRED FORTE) 1 % ophthalmic suspension Place 1 drop into the left eye 4 (four) times daily. 5 mL  0  . sodium chloride (OCEAN) 0.65 % SOLN nasal spray Place 2 sprays into both nostrils daily as needed for congestion.     . sucroferric oxyhydroxide (VELPHORO) 500 MG chewable tablet Chew 500 mg by mouth 3 (three) times daily with meals.    [name] sulfamethoxazole-trimethoprim (BACTRIM) 400-80 MG per tablet Take 1 tablet by mouth 2 (two) times daily. 10 tablet 0  . tadalafil (CIALIS) 20 MG tablet Take 20 mg by mouth once a week. Monday evenings after supper    . timolol (TIMOPTIC) 0.5 % ophthalmic solution Place 1 drop into the right eye 2 (two) times daily. 10 mL 12   No current facility-administered medications for this visit.     Past Medical History:  Diagnosis Date  . Anemia    hx of at beginnning of dialysis- 05/2009  . Arthritis    right hip   . End stage renal disease ([place])    [place]   . End stage renal disease on dialysis ([place])   . Essential hypertension, malignant   . Hypertrophy of prostate without urinary obstruction and other lower urinary tract symptoms (LUTS)   . Pure hypercholesterolemia   . Renal insufficiency   . Type II or unspecified type diabetes mellitus without mention of complication, not stated as uncontrolled    TYPE 2     Past Surgical History:  Procedure Laterality Date  . CARDIAC CATHETERIZATION  04/29/10   normal coronaries, LVEF 45%, mild global LV hypokinesis ([place])  . CATARACT EXTRACTION W/PHACO Right 04/03/2013   Procedure: CATARACT EXTRACTION PHACO AND INTRAOCULAR LENS PLACEMENT (IOC);  Surgeon: Adonis Brook, MD;  Location: [facility];  Service: Ophthalmology;  Laterality: Right;  . CERVICAL DISCECTOMY  03/1999  . COLONOSCOPY     Hx: of  . CYSTOSCOPY WITH BIOPSY  11/01/2012   Procedure: CYSTOSCOPY WITH BIOPSY;  Surgeon: Molli Hazard, MD;  Location: [facility];  Service: Urology;  Laterality: N/A;  with fulgeration  . CYSTOSCOPY WITH RETROGRADE PYELOGRAM, URETEROSCOPY AND STENT PLACEMENT  11/01/2012   Procedure: CYSTOSCOPY WITH RETROGRADE PYELOGRAM, URETEROSCOPY AND STENT PLACEMENT;  Surgeon: Molli Hazard, MD;  Location: [facility];  Service: Urology;  Laterality: Bilateral;  . DIALYSIS FISTULA CREATION  2010  . EYE SURGERY  2012   cataracts  . EYE SURGERY  11/2014  . HOLMIUM LASER APPLICATION  5/39/7673   Procedure: HOLMIUM LASER APPLICATION;  Surgeon: Molli Hazard, MD;  Location: [facility];  Service: Urology;  Laterality: Bilateral;  . KIDNEY TRANSPLANT  2016  . MEMBRANE PEEL Right 09/04/2013   Procedure: MEMBRANE PEEL;  Surgeon: Adonis Brook, MD;  Location: [facility];  Service: Ophthalmology;  Laterality: Right;  . NECK SURGERY  1990  . PARS PLANA VITRECTOMY Right 09/04/2013   Procedure: PARS PLANA VITRECTOMY WITH 23 GAUGE;  Surgeon: Adonis Brook, MD;  Location: [facility];  Service: Ophthalmology;   Laterality: Right;  . PARS PLANA VITRECTOMY Left 02/17/2015  . PARS PLANA VITRECTOMY Left 02/17/2015   Procedure: PARS PLANA VITRECTOMY WITH 25G REMOVAL/SUTURE INTRAOCULAR LENS;  Surgeon: Hayden Pedro, MD;  Location: [facility];  Service: Ophthalmology;  Laterality: Left;  . PERIPHERAL VASCULAR CATHETERIZATION Left 08/08/2016   Procedure: A/V Shuntogram/Fistulagram;  Surgeon: Algernon Huxley, MD;  Location: [facility];  Service: Cardiovascular;  Laterality: Left;  . PERIPHERAL VASCULAR CATHETERIZATION N/A 08/08/2016   Procedure: A/V Shunt Intervention;  Surgeon: Algernon Huxley, MD;  Location: [facility];  Service: Cardiovascular;  Laterality: N/A;  . PHOTOCOAGULATION WITH LASER Right 09/04/2013   Procedure: PHOTOCOAGULATION WITH LASER;  Surgeon: Adonis Brook, MD;  Location: [facility];  Service: Ophthalmology;  Laterality: Right;  ENDOLASER  . TRANSTHORACIC ECHOCARDIOGRAM  06/19/2012   normal LV sys function, EF 55-60%, mild to mod diastolic dysfunction, mild AI ([place])    Social History Social History  Substance Use Topics  . Smoking status: Former Smoker    Types: Cigarettes    Quit date: 10/02/1990  . Smokeless tobacco: Never Used  . Alcohol use Yes     Comment: occasional    Family History Family History  Problem Relation Age of Onset  . Breast cancer Mother 61       deceased  . Pneumonia Father 9       deceased  . Hypertension Brother   . Diabetes Mellitus II Brother     Allergies  Allergen Reactions  . Codeine Nausea And Vomiting and Nausea Only  . Doxycycline Nausea And Vomiting and Nausea Only  . Latex Hives and Rash     REVIEW OF SYSTEMS (Negative unless checked)  Constitutional: [] Weight loss  [] Fever  [] Chills Cardiac: [] Chest pain   [] Chest pressure   [] Palpitations   [] Shortness of breath when laying flat   [] Shortness of breath at rest   [] Shortness of breath with exertion. Vascular:  [] Pain in legs with walking   [] Pain in legs at rest    [] Pain in legs when laying flat   [] Claudication   [] Pain in feet when walking  [] Pain in feet at rest  [] Pain in feet when laying flat   [] History of DVT   [] Phlebitis   [] Swelling in legs   [] Varicose veins   [] Non-healing ulcers Pulmonary:   [] Uses home oxygen   [] Productive cough   [] Hemoptysis   [] Wheeze  [] COPD   [] Asthma Neurologic:  [] Dizziness  [] Blackouts   [] Seizures   [] History of stroke   [] History of TIA  [] Aphasia   [] Temporary blindness   [] Dysphagia   [] Weakness or numbness in arms   [] Weakness or numbness in legs Musculoskeletal:  [x] Arthritis   [] Joint swelling   [] Joint pain   [] Low back pain Hematologic:  [] Easy bruising  [] Easy bleeding   [] Hypercoagulable state   [] Anemic   Gastrointestinal:  [] Blood in stool   [] Vomiting blood  [] Gastroesophageal reflux/heartburn   [] Abdominal pain Genitourinary:  [x] Chronic kidney disease   [] Difficult urination  [] Frequent urination  [] Burning with urination   [] Hematuria Skin:  [] Rashes   [] Ulcers   [] Wounds Psychological:  [] History of anxiety   []  History of major depression.  Physical Examination  BP 130/78   Pulse (!) 55   Resp 16   Wt 226 lb (102.5 kg)   BMI 30.65 kg/m  Gen:  WD/WN, NAD Head: [place]/AT, No temporalis wasting. Ear/Nose/Throat: Hearing grossly intact, nares w/o erythema or drainage, trachea midline Eyes: Conjunctiva clear. Sclera non-icteric Neck: Supple.  No JVD.  Pulmonary:  Good air movement, no use of accessory muscles.  Cardiac: RRR, normal S1, S2 Vascular: aneurysmal left arm AVF with weak thrill Vessel Right Left  Radial Palpable Palpable                                    Musculoskeletal: M/S 5/5 throughout.  No deformity or atrophy.  Neurologic: Sensation grossly intact in extremities.  Symmetrical.  Speech is fluent.  Psychiatric: Judgment intact, Mood &  affect appropriate for pt's clinical situation. Dermatologic: No rashes or ulcers noted.  No cellulitis or open  wounds.       Labs No results found for this or any previous visit (from the past 2160 hour(s)).  Radiology No results found.    Assessment/Plan  Diabetes mellitus blood glucose control important in reducing the progression of atherosclerotic disease. Also, involved in wound healing. On appropriate medications.   HTN (hypertension) blood pressure control important in reducing the progression of atherosclerotic disease. On appropriate oral medications.   End stage renal disease ([place]) His duplex today shows markedly elevated velocities at the cephalic vein subclavian vein confluence with associated thrombus in this area. There is low flow through the fistula. 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 Even though he now has a kidney transplant and is not on dialysis, this is his backup access option and it is certainly at risk of thrombosis without intervention. I think we can do an intervention with a very limited amount of contrast and would recommend that at this point. I discussed the risks and benefits the procedure. I have told him if the fistula thrombosis, we would not likely be able to salvage a due to the aneurysmal nature of it. He voices his understanding and is agreeable with our plan of care.    Leotis Pain, MD  06/09/2017 12:39 PM    This note was created with Dragon medical transcription system.  Any errors from dictation are purely unintentional

## 2017-06-09 NOTE — Assessment & Plan Note (Signed)
blood glucose control important in reducing the progression of atherosclerotic disease. Also, involved in wound healing. On appropriate medications.  

## 2017-06-11 MED ORDER — CEFAZOLIN SODIUM-DEXTROSE 2-4 GM/100ML-% IV SOLN
2.0000 g | Freq: Once | INTRAVENOUS | Status: AC
  Administered 2017-06-12: 2 g via INTRAVENOUS

## 2017-06-12 ENCOUNTER — Encounter: Payer: Self-pay | Admitting: Vascular Surgery

## 2017-06-12 ENCOUNTER — Encounter
Admission: RE | Disposition: A | Discharge status: Home / Self Care | Payer: Self-pay | Source: Ambulatory Visit | Attending: Vascular Surgery

## 2017-06-12 ENCOUNTER — Ambulatory Visit
Admission: RE | Admit: 2017-06-12 | Discharge: 2017-06-12 | Disposition: A | Discharge status: Home / Self Care | Payer: Medicare Other | Source: Ambulatory Visit | Attending: Vascular Surgery | Admitting: Vascular Surgery

## 2017-06-12 DIAGNOSIS — E1122 Type 2 diabetes mellitus with diabetic chronic kidney disease: Secondary | ICD-10-CM | POA: Diagnosis not present

## 2017-06-12 DIAGNOSIS — Y832 Surgical operation with anastomosis, bypass or graft as the cause of abnormal reaction of the patient, or of later complication, without mention of misadventure at the time of the procedure: Secondary | ICD-10-CM | POA: Insufficient documentation

## 2017-06-12 DIAGNOSIS — T82858A Stenosis of vascular prosthetic devices, implants and grafts, initial encounter: Secondary | ICD-10-CM | POA: Insufficient documentation

## 2017-06-12 DIAGNOSIS — Z992 Dependence on renal dialysis: Secondary | ICD-10-CM | POA: Diagnosis not present

## 2017-06-12 DIAGNOSIS — Z833 Family history of diabetes mellitus: Secondary | ICD-10-CM | POA: Insufficient documentation

## 2017-06-12 DIAGNOSIS — E7800 Pure hypercholesterolemia, unspecified: Secondary | ICD-10-CM | POA: Diagnosis not present

## 2017-06-12 DIAGNOSIS — Z9841 Cataract extraction status, right eye: Secondary | ICD-10-CM | POA: Insufficient documentation

## 2017-06-12 DIAGNOSIS — N400 Enlarged prostate without lower urinary tract symptoms: Secondary | ICD-10-CM | POA: Insufficient documentation

## 2017-06-12 DIAGNOSIS — Z885 Allergy status to narcotic agent status: Secondary | ICD-10-CM | POA: Insufficient documentation

## 2017-06-12 DIAGNOSIS — Z8249 Family history of ischemic heart disease and other diseases of the circulatory system: Secondary | ICD-10-CM | POA: Insufficient documentation

## 2017-06-12 DIAGNOSIS — I120 Hypertensive chronic kidney disease with stage 5 chronic kidney disease or end stage renal disease: Secondary | ICD-10-CM | POA: Diagnosis not present

## 2017-06-12 DIAGNOSIS — Z940 Kidney transplant status: Secondary | ICD-10-CM | POA: Diagnosis not present

## 2017-06-12 DIAGNOSIS — Z91040 Latex allergy status: Secondary | ICD-10-CM | POA: Diagnosis not present

## 2017-06-12 DIAGNOSIS — Z87891 Personal history of nicotine dependence: Secondary | ICD-10-CM | POA: Diagnosis not present

## 2017-06-12 DIAGNOSIS — M1611 Unilateral primary osteoarthritis, right hip: Secondary | ICD-10-CM | POA: Diagnosis not present

## 2017-06-12 DIAGNOSIS — N186 End stage renal disease: Secondary | ICD-10-CM

## 2017-06-12 DIAGNOSIS — Z803 Family history of malignant neoplasm of breast: Secondary | ICD-10-CM | POA: Insufficient documentation

## 2017-06-12 DIAGNOSIS — Z881 Allergy status to other antibiotic agents status: Secondary | ICD-10-CM | POA: Insufficient documentation

## 2017-06-12 DIAGNOSIS — T82868A Thrombosis of vascular prosthetic devices, implants and grafts, initial encounter: Secondary | ICD-10-CM | POA: Diagnosis not present

## 2017-06-12 DIAGNOSIS — Z9889 Other specified postprocedural states: Secondary | ICD-10-CM | POA: Diagnosis not present

## 2017-06-12 HISTORY — PX: A/V FISTULAGRAM: CATH118298

## 2017-06-12 LAB — POTASSIUM (ARMC VASCULAR LAB ONLY): Potassium (ARMC vascular lab): 3.7 (ref 3.5–5.1)

## 2017-06-12 SURGERY — A/V FISTULAGRAM
Anesthesia: Moderate Sedation | Laterality: Left

## 2017-06-12 MED ORDER — LIDOCAINE-EPINEPHRINE (PF) 2 %-1:200000 IJ SOLN
INTRAMUSCULAR | Status: AC
  Filled 2017-06-12: qty 20

## 2017-06-12 MED ORDER — MIDAZOLAM HCL 5 MG/5ML IJ SOLN
INTRAMUSCULAR | Status: AC
  Filled 2017-06-12: qty 5

## 2017-06-12 MED ORDER — MIDAZOLAM HCL 2 MG/2ML IJ SOLN
INTRAMUSCULAR | Status: DC | PRN
  Administered 2017-06-12: 2 mg via INTRAVENOUS

## 2017-06-12 MED ORDER — HEPARIN (PORCINE) IN NACL 2-0.9 UNIT/ML-% IJ SOLN
INTRAMUSCULAR | Status: AC
  Filled 2017-06-12: qty 1000

## 2017-06-12 MED ORDER — HEPARIN SODIUM (PORCINE) 1000 UNIT/ML IJ SOLN
INTRAMUSCULAR | Status: DC | PRN
  Administered 2017-06-12: 3000 [IU] via INTRAVENOUS

## 2017-06-12 MED ORDER — FENTANYL CITRATE (PF) 100 MCG/2ML IJ SOLN
INTRAMUSCULAR | Status: AC
  Filled 2017-06-12: qty 2

## 2017-06-12 MED ORDER — HEPARIN SODIUM (PORCINE) 1000 UNIT/ML IJ SOLN
INTRAMUSCULAR | Status: AC
Start: 2017-06-12 — End: ?
  Filled 2017-06-12: qty 1

## 2017-06-12 MED ORDER — IOPAMIDOL (ISOVUE-300) INJECTION 61%
INTRAVENOUS | Status: DC | PRN
  Administered 2017-06-12: 23 mL via INTRAVENOUS

## 2017-06-12 MED ORDER — FENTANYL CITRATE (PF) 100 MCG/2ML IJ SOLN
INTRAMUSCULAR | Status: DC | PRN
  Administered 2017-06-12: 50 ug via INTRAVENOUS

## 2017-06-12 MED ORDER — SODIUM CHLORIDE 0.9 % IV SOLN
INTRAVENOUS | Status: DC
  Administered 2017-06-12: 10:00:00 via INTRAVENOUS

## 2017-06-12 SURGICAL SUPPLY — 12 items
BALLN DORADO 7X40X80 (BALLOONS) ×3
BALLN DORADO 8X60X80 (BALLOONS) ×3
BALLN LUTONIX DCB 7X60X130 (BALLOONS) ×3
BALLOON DORADO 7X40X80 (BALLOONS) ×1 IMPLANT
BALLOON DORADO 8X60X80 (BALLOONS) ×1 IMPLANT
BALLOON LUTONIX DCB 7X60X130 (BALLOONS) ×1 IMPLANT
CANNULA 5F STIFF (CANNULA) ×3 IMPLANT
DEVICE PRESTO INFLATION (MISCELLANEOUS) ×3 IMPLANT
PACK ANGIOGRAPHY (CUSTOM PROCEDURE TRAY) ×3 IMPLANT
SHEATH BRITE TIP 6FRX5.5 (SHEATH) ×3 IMPLANT
SHIELD RADPAD SCOOP 12X17 (MISCELLANEOUS) ×3 IMPLANT
WIRE MAGIC TORQUE 260C (WIRE) ×3 IMPLANT

## 2017-06-12 NOTE — H&P (Signed)
[facility] History & Physical Update  The patient was interviewed and re-examined.  The patient's previous History and Physical has been reviewed and is unchanged.  There is no change in the plan of care. We plan to proceed with the scheduled procedure.  Leotis Pain, MD  06/12/2017, 9:18 AM

## 2017-06-12 NOTE — Op Note (Addendum)
[facility]    OPERATIVE NOTE   PROCEDURE: 1.   Left brachiocephalic arteriovenous fistula cannulation under ultrasound guidance 2.   Left arm fistulagram including central venogram 3.   Percutaneous transluminal angioplasty of the left cephalic vein at the cephalic vein subclavian vein confluence with 7 mm diameter drug-coated and high pressure angioplasty balloon and 8 mm diameter high pressure angioplasty balloon  PRE-OPERATIVE DIAGNOSIS: 1. ESRD 2. 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 Large left brachiocephalic AV fistula without focal stenosis at the cephalic vein 1-2 cm from the cephalic vein subclavian vein confluence had a high-grade near occlusive stenosis of over 90%. There was then about a 40% stenosis in the left innominate vein and the did not appear flow limiting but was not entirely normal.  SPECIMEN(S):  None  CONTRAST: 23 cc  FLUORO TIME: 2 minutes  MODERATE CONSCIOUS SEDATION TIME: Approximately 20 minutes with 2 mg of Versed and 50 mcg of Fentanyl   INDICATIONS: [name] is a 71 y.o. male who presents with an aneurysmal left brachiocephalic arteriovenous fistula with poor flow on duplex.  The patient is scheduled for left arm fistulagram.  The patient is aware the risks include but are not limited to: bleeding, infection, thrombosis of the cannulated access, and possible anaphylactic reaction to the contrast.  The patient is aware of the risks of the procedure and elects to proceed forward.  DESCRIPTION: After full informed written consent was obtained, the patient was brought back to the angiography suite and placed supine upon the angiography table.  The patient was connected to monitoring equipment. Moderate conscious sedation was administered with a face to  face encounter with the patient throughout the procedure with my supervision of the RN administering medicines and monitoring the patient's vital signs and mental status throughout from the start of the procedure until the patient was taken to the recovery room. The left arm was prepped and draped in the standard fashion for a percutaneous access intervention.  Under ultrasound guidance, the left brachiocephalic arteriovenous fistula was cannulated with a micropuncture needle under direct ultrasound guidance and a permanent image was performed.  The microwire was advanced into the fistula and the needle was exchanged for the a microsheath.  I then upsized to a 6 Fr Sheath and imaging was performed.  Hand injections were completed to image the access including the central venous system. This demonstrated a large left brachiocephalic AV fistula without focal stenosis at the cephalic vein 1-2 cm from the cephalic vein subclavian vein confluence had a high-grade near occlusive stenosis of over 90%. There was then about a 40% stenosis in the left innominate vein and the did not appear flow limiting but was not entirely normal..  Based on the images, this patient will need intervention to the cephalic vein near the subclavian vein confluence. I then gave the patient 3000 units of intravenous heparin.  I then crossed the stenosis with a Magic Tourqe wire.  Based on the imaging, a 7 mm x 6 cm  Lutonix drug coated angioplasty balloon was selected.  The balloon was centered around the cephalic vein stenosis near the subclavian vein confluence and inflated to burst inflation without the resolution of the waist. The balloon actually burst. I then used a 7 mm diameter by 4 cm length high pressure angioplasty balloon  and inflated this to 26 atm for 1 minute.  On completion imaging, a greater than 50% residual stenosis was present.  I then upsized to an 8 mm diameter by 6 cm length high pressure angioplasty balloon and inflated  this to 20 atm for 1 minute. The waist was resolved and completion angiogram showed only about a 25-30% residual stenosis.   Based on the completion imaging, no further intervention is necessary.  The wire and balloon were removed from the sheath.  A 4-0 Monocryl purse-string suture was sewn around the sheath.  The sheath was removed while tying down the suture.  A sterile bandage was applied to the puncture site.  COMPLICATIONS: None  CONDITION: Stable   Leotis Pain  06/12/2017 11:00 AM   This note was created with Dragon Medical transcription system. Any errors in dictation are purely unintentional.

## 2017-06-12 NOTE — Discharge Instructions (Signed)

## 2017-07-27 ENCOUNTER — Other Ambulatory Visit (INDEPENDENT_AMBULATORY_CARE_PROVIDER_SITE_OTHER): Payer: Self-pay | Admitting: Vascular Surgery

## 2017-07-27 DIAGNOSIS — N186 End stage renal disease: Secondary | ICD-10-CM

## 2017-07-27 DIAGNOSIS — T82898D Other specified complication of vascular prosthetic devices, implants and grafts, subsequent encounter: Secondary | ICD-10-CM

## 2017-07-28 ENCOUNTER — Ambulatory Visit (INDEPENDENT_AMBULATORY_CARE_PROVIDER_SITE_OTHER): Payer: Medicare Other

## 2017-07-28 ENCOUNTER — Ambulatory Visit (INDEPENDENT_AMBULATORY_CARE_PROVIDER_SITE_OTHER): Payer: Medicare Other | Admitting: Vascular Surgery

## 2017-07-28 ENCOUNTER — Encounter (INDEPENDENT_AMBULATORY_CARE_PROVIDER_SITE_OTHER): Payer: Self-pay | Admitting: Vascular Surgery

## 2017-07-28 VITALS — BP 167/73 | HR 60 | Resp 16 | Wt 233.6 lb

## 2017-07-28 DIAGNOSIS — T82898D Other specified complication of vascular prosthetic devices, implants and grafts, subsequent encounter: Secondary | ICD-10-CM | POA: Diagnosis not present

## 2017-07-28 DIAGNOSIS — I10 Essential (primary) hypertension: Secondary | ICD-10-CM

## 2017-07-28 DIAGNOSIS — E1122 Type 2 diabetes mellitus with diabetic chronic kidney disease: Secondary | ICD-10-CM

## 2017-07-28 DIAGNOSIS — N186 End stage renal disease: Secondary | ICD-10-CM

## 2017-07-28 DIAGNOSIS — Z992 Dependence on renal dialysis: Secondary | ICD-10-CM

## 2017-07-28 NOTE — Patient Instructions (Signed)
AV Fistula Placement, Care After °Refer to this sheet in the next few weeks. These instructions provide you with information about caring for yourself after your procedure. Your health care provider may also give you more specific instructions. Your treatment has been planned according to current medical practices, but problems sometimes occur. Call your health care provider if you have any problems or questions after your procedure. °What can I expect after the procedure? °After your procedure, it is common to: °· Feel sore. °· Have numbness. °· Feel cold. °· Feel a vibration (thrill) over the fistula. ° °Follow these instructions at home: ° °Incision care °· Do not take baths or showers, swim, or use a hot tub until your health care provider approves. °· Keep the area around your cut from surgery (incision) clean and dry. °· Follow instructions from your health care provider about how to take care of your incision. Make sure you: °? Wash your hands with soap and water before you change your bandage (dressing). If soap and water are not available, use hand sanitizer. °? Change your dressing as told by your health care provider. °? Leave stitches (sutures) and clips in place. They may need to stay in place for 2 weeks or longer. °Fistula Care °· Check your fistula site every day to make sure the thrill feels the same. °· Check your fistula site every day for signs of infection. Watch for: °? Redness. °? Swelling. °? Discharge. °? Tenderness. °? Enlargement. °· Keep your arm raised (elevated) while you rest. °· Do not lift anything that is heavier than a gallon of milk with the arm that has the fistula. °· Do not lie down on your fistula arm. °· Do not let anyone draw blood or take a blood pressure reading on your fistula arm. °· Do not wear tight jewelry or clothing over your fistula arm. °General instructions °· Rest at home for a day or two. °· Gradually start doing your usual activities again. Ask your surgeon  when you can return to work or school. °· Take over-the-counter and prescription medicines only as told by your health care provider. °· Keep all follow-up visits as told by your health care provider. This is important. °Contact a health care provider if: °· You have chills or a fever. °· You have pain at your fistula site that is not going away. °· You have numbness or coldness at your fistula site that is not going away. °· You feel a decrease or a change in the thrill. °· You have swelling in your arm or hand. °· You have redness, swelling, discharge, tenderness, or enlargement at your fistula site. °Get help right away if: °· You are bleeding from your fistula site. °· You have chest pain. °· You have trouble breathing. °This information is not intended to replace advice given to you by your health care provider. Make sure you discuss any questions you have with your health care provider. °Document Released: 10/03/2005 Document Revised: 02/10/2016 Document Reviewed: 12/24/2014 °Elsevier Interactive Patient Education © 2018 [employer] ° °

## 2017-07-28 NOTE — Progress Notes (Signed)
MRN : [id number]  [name] is a 71 y.o. (12-26-1945) male who presents with chief complaint of  Chief Complaint  Patient presents with  . Follow-up    6wk HDA  .  History of Present Illness: Patient returns today in follow up of his AVF.  He had an intervention several weeks ago, and had no periprocedural complications.  He has gotten a kidney transplant so he is not currently using his fistula.  His duplex today does show some elevated velocities at the cephalic vein subclavian vein confluence.  The remainder of the AV fistula appears to be patent.  Current Outpatient Prescriptions  Medication Sig Dispense Refill  . acetaminophen (TYLENOL) 325 MG tablet Take 650 mg by mouth every 6 (six) hours as needed (pain).     [name] allopurinol (ZYLOPRIM) 300 MG tablet Take 300 mg by mouth daily with breakfast.     . amLODipine (NORVASC) 5 MG tablet Take 5 mg by mouth daily with breakfast.     . aspirin 81 MG chewable tablet Chew 81 mg by mouth daily.    . B Complex-C-Folic Acid (DIALYVITE 177) 0.8 MG TABS Take 0.8 mg by mouth daily with breakfast.     . bacitracin-polymyxin b (POLYSPORIN) ophthalmic ointment Place 1 application into the left eye 3 (three) times daily. apply to eye every 8 hours while awake 3.5 g 0  . brimonidine (ALPHAGAN) 0.2 % ophthalmic solution Place 1 drop into the left eye 2 (two) times daily. 5 mL 12  . brimonidine-timolol (COMBIGAN) 0.2-0.5 % ophthalmic solution Place 1 drop into the right eye 2 (two) times daily.     . brinzolamide (AZOPT) 1 % ophthalmic suspension Place 1 drop into the right eye 3 (three) times daily.    . brinzolamide (AZOPT) 1 % ophthalmic suspension Place 1 drop into the right eye 3 (three) times daily. 10 mL 12  . cinacalcet (SENSIPAR) 90 MG tablet Take 90 mg by mouth daily with supper.     . cloNIDine (CATAPRES) 0.1 MG tablet Take 0.1 mg by mouth 2 (two) times daily.    [name] doxazosin (CARDURA) 8 MG tablet Take 4 mg by mouth  daily with breakfast.     . famotidine (PEPCID) 20 MG tablet Take 20 mg by mouth 2 (two) times daily.    . furosemide (LASIX) 40 MG tablet Take 40 mg by mouth 2 (two) times daily.    [name] gatifloxacin (ZYMAXID) 0.5 % SOLN Place 1 drop into the left eye 4 (four) times daily.    [name] glimepiride (AMARYL) 4 MG tablet Take 4 mg by mouth daily with breakfast.     . HYDROcodone-acetaminophen (NORCO/VICODIN) 5-325 MG per tablet Take 1-2 tablets by mouth every 4 (four) hours as needed for moderate pain. 30 tablet 0  . latanoprost (XALATAN) 0.005 % ophthalmic solution Place 1 drop into the right eye at bedtime.     [name] linagliptin (TRADJENTA) 5 MG TABS tablet Take 5 mg by mouth daily with breakfast.     . losartan (COZAAR) 100 MG tablet Take 100 mg by mouth daily.    . metoprolol (LOPRESSOR) 50 MG tablet Take 25 mg by mouth 2 (two) times daily.    . nepafenac (ILEVRO) 0.3 % ophthalmic suspension Place 1 drop into the left eye at bedtime.    . prednisoLONE acetate (PRED FORTE) 1 % ophthalmic suspension Place 1 drop into the left eye 4 (four) times daily. 5 mL 0  . sodium chloride (OCEAN)  0.65 % SOLN nasal spray Place 2 sprays into both nostrils daily as needed for congestion.     . sucroferric oxyhydroxide (VELPHORO) 500 MG chewable tablet Chew 500 mg by mouth 3 (three) times daily with meals.    [name] sulfamethoxazole-trimethoprim (BACTRIM) 400-80 MG per tablet Take 1 tablet by mouth 2 (two) times daily. 10 tablet 0  . tadalafil (CIALIS) 20 MG tablet Take 20 mg by mouth once a week. Monday evenings after supper    . timolol (TIMOPTIC) 0.5 % ophthalmic solution Place 1 drop into the right eye 2 (two) times daily. 10 mL 12   No current facility-administered medications for this visit.         Past Medical History:  Diagnosis Date  . Anemia    hx of at beginnning of dialysis- 05/2009  . Arthritis    right hip   . End stage renal disease ([place])    [place] [facility]   . End stage renal disease on dialysis ([place])   . Essential hypertension, malignant   . Hypertrophy of prostate without urinary obstruction and other lower urinary tract symptoms (LUTS)   . Pure hypercholesterolemia   . Renal insufficiency   . Type II or unspecified type diabetes mellitus without mention of complication, not stated as uncontrolled    TYPE 2          Past Surgical History:  Procedure Laterality Date  . CARDIAC CATHETERIZATION  04/29/10   normal coronaries, LVEF 45%, mild global LV hypokinesis ([place])  . CATARACT EXTRACTION W/PHACO Right 04/03/2013   Procedure: CATARACT EXTRACTION PHACO AND INTRAOCULAR LENS PLACEMENT (IOC);  Surgeon: Adonis Brook, MD;  Location: [place];  Service: Ophthalmology;  Laterality: Right;  . CERVICAL DISCECTOMY  03/1999  . COLONOSCOPY     Hx: of  . CYSTOSCOPY WITH BIOPSY  11/01/2012   Procedure: CYSTOSCOPY WITH BIOPSY;  Surgeon: Molli Hazard, MD;  Location: [facility];  Service: Urology;  Laterality: N/A;  with fulgeration  . CYSTOSCOPY WITH RETROGRADE PYELOGRAM, URETEROSCOPY AND STENT PLACEMENT  11/01/2012   Procedure: CYSTOSCOPY WITH RETROGRADE PYELOGRAM, URETEROSCOPY AND STENT PLACEMENT;  Surgeon: Molli Hazard, MD;  Location: [facility];  Service: Urology;  Laterality: Bilateral;  . DIALYSIS FISTULA CREATION  2010  . EYE SURGERY  2012   cataracts  . EYE SURGERY  11/2014  . HOLMIUM LASER APPLICATION  3/79/0240   Procedure: HOLMIUM LASER APPLICATION;  Surgeon: Molli Hazard, MD;  Location: [facility];  Service: Urology;  Laterality: Bilateral;  . KIDNEY TRANSPLANT  2016  . MEMBRANE PEEL Right 09/04/2013   Procedure: MEMBRANE PEEL;  Surgeon: Adonis Brook, MD;  Location: [place];  Service: Ophthalmology;  Laterality: Right;  . NECK SURGERY  1990  . PARS PLANA VITRECTOMY Right 09/04/2013   Procedure: PARS PLANA VITRECTOMY WITH 23 GAUGE;  Surgeon: Adonis Brook, MD;  Location: [place];   Service: Ophthalmology;  Laterality: Right;  . PARS PLANA VITRECTOMY Left 02/17/2015  . PARS PLANA VITRECTOMY Left 02/17/2015   Procedure: PARS PLANA VITRECTOMY WITH 25G REMOVAL/SUTURE INTRAOCULAR LENS;  Surgeon: Hayden Pedro, MD;  Location: [place];  Service: Ophthalmology;  Laterality: Left;  . PERIPHERAL VASCULAR CATHETERIZATION Left 08/08/2016   Procedure: A/V Shuntogram/Fistulagram;  Surgeon: Algernon Huxley, MD;  Location: [facility];  Service: Cardiovascular;  Laterality: Left;  . PERIPHERAL VASCULAR CATHETERIZATION N/A 08/08/2016   Procedure: A/V Shunt Intervention;  Surgeon: Algernon Huxley, MD;  Location: [facility];  Service: Cardiovascular;  Laterality: N/A;  . PHOTOCOAGULATION WITH LASER Right 09/04/2013   Procedure: PHOTOCOAGULATION WITH LASER;  Surgeon: Adonis Brook, MD;  Location: [place];  Service: Ophthalmology;  Laterality: Right;  ENDOLASER  . TRANSTHORACIC ECHOCARDIOGRAM  06/19/2012   normal LV sys function, EF 55-60%, mild to mod diastolic dysfunction, mild AI ([place])    Social History        Social History  Substance Use Topics  . Smoking status: Former Smoker    Types: Cigarettes    Quit date: 10/02/1990  . Smokeless tobacco: Never Used  . Alcohol use Yes      Comment: occasional    Family History      Family History  Problem Relation Age of Onset  . Breast cancer Mother 16       deceased  . Pneumonia Father 24       deceased  . Hypertension Brother   . Diabetes Mellitus II Brother         Allergies  Allergen Reactions  . Codeine Nausea And Vomiting and Nausea Only  . Doxycycline Nausea And Vomiting and Nausea Only  . Latex Hives and Rash     REVIEW OF SYSTEMS (Negative unless checked)  Constitutional: [] Weight loss  [] Fever  [] Chills Cardiac: [] Chest pain   [] Chest pressure   [] Palpitations   [] Shortness of breath when laying flat   [] Shortness of breath at rest   [] Shortness of breath with  exertion. Vascular:  [] Pain in legs with walking   [] Pain in legs at rest   [] Pain in legs when laying flat   [] Claudication   [] Pain in feet when walking  [] Pain in feet at rest  [] Pain in feet when laying flat   [] History of DVT   [] Phlebitis   [] Swelling in legs   [] Varicose veins   [] Non-healing ulcers Pulmonary:   [] Uses home oxygen   [] Productive cough   [] Hemoptysis   [] Wheeze  [] COPD   [] Asthma Neurologic:  [] Dizziness  [] Blackouts   [] Seizures   [] History of stroke   [] History of TIA  [] Aphasia   [] Temporary blindness   [] Dysphagia   [] Weakness or numbness in arms   [] Weakness or numbness in legs Musculoskeletal:  [x] Arthritis   [] Joint swelling   [] Joint pain   [] Low back pain Hematologic:  [] Easy bruising  [] Easy bleeding   [] Hypercoagulable state   [] Anemic   Gastrointestinal:  [] Blood in stool   [] Vomiting blood  [] Gastroesophageal reflux/heartburn   [] Abdominal pain Genitourinary:  [x] Chronic kidney disease   [] Difficult urination  [] Frequent urination  [] Burning with urination   [] Hematuria Skin:  [] Rashes   [] Ulcers   [] Wounds Psychological:  [] History of anxiety   []  History of major depression.   Physical Examination  BP (!) 167/73 (BP Location: Right Arm)   Pulse 60   Resp 16   Wt 106 kg (233 lb 9.6 oz)   BMI 31.68 kg/m  Gen:  WD/WN, NAD Head: [place]/AT, No temporalis wasting. Ear/Nose/Throat: Hearing grossly intact, nares w/o erythema or drainage, trachea midline Eyes: Conjunctiva clear. Sclera non-icteric Neck: Supple.  No JVD.  Pulmonary:  Good air movement, no use of accessory muscles.  Cardiac: RRR, normal S1, S2 Vascular: Aneurysmal left upper arm AV fistula with good thrill present Vessel Right Left  Radial Palpable Palpable  Musculoskeletal: M/S 5/5 throughout.  No deformity or atrophy.  Neurologic: Sensation grossly intact in extremities.  Symmetrical.  Speech is fluent.  Psychiatric: Judgment intact, Mood & affect  appropriate for pt's clinical situation. Dermatologic: No rashes or ulcers noted.  No cellulitis or open wounds.       Labs Recent Results (from the past 2160 hour(s))  Potassium [facility] vascular lab only)     Status: None   Collection Time: 06/12/17  9:50 AM  Result Value Ref Range   Potassium [facility] vascular lab) 3.7 3.5 - 5.1    Radiology No results found.    Assessment/Plan Diabetes mellitus blood glucose control important in reducing the progression of atherosclerotic disease. Also, involved in wound healing. On appropriate medications.   HTN (hypertension) blood pressure control important in reducing the progression of atherosclerotic disease. On appropriate oral medications.  End stage renal disease ([place]) Now status post renal transplant no longer on dialysis.  We are trying to keep his fistula as a backup option in the future. His duplex today does show some elevated velocities at the cephalic vein subclavian vein confluence.  The remainder of the AV fistula appears to be patent.  At this point, we will continue to observe this and recheck in 6 months with noninvasive studies.  He will contact our office with any problems in the interim.    Leotis Pain, MD  07/28/2017 11:13 AM    This note was created with Dragon medical transcription system.  Any errors from dictation are purely unintentional

## 2017-07-28 NOTE — Assessment & Plan Note (Signed)
Now status post renal transplant no longer on dialysis.  We are trying to keep his fistula as a backup option in the future. His duplex today does show some elevated velocities at the cephalic vein subclavian vein confluence.  The remainder of the AV fistula appears to be patent.  At this point, we will continue to observe this and recheck in 6 months with noninvasive studies.  He will contact our office with any problems in the interim.

## 2017-08-28 ENCOUNTER — Encounter: Payer: Self-pay | Admitting: Podiatry

## 2017-08-28 ENCOUNTER — Ambulatory Visit (INDEPENDENT_AMBULATORY_CARE_PROVIDER_SITE_OTHER): Payer: Medicare Other | Admitting: Podiatry

## 2017-08-28 DIAGNOSIS — M79674 Pain in right toe(s): Secondary | ICD-10-CM

## 2017-08-28 DIAGNOSIS — E119 Type 2 diabetes mellitus without complications: Secondary | ICD-10-CM

## 2017-08-28 DIAGNOSIS — B351 Tinea unguium: Secondary | ICD-10-CM

## 2017-08-28 DIAGNOSIS — M79675 Pain in left toe(s): Secondary | ICD-10-CM | POA: Diagnosis not present

## 2017-08-28 NOTE — Progress Notes (Signed)
Patient ID: [name], male   DOB: 01-14-1946, 71 y.o.   MRN: [id number]    Subjective: This patient presents today again complaining of thickened and elongated toenails which are uncomfortable when  walking wearing shoes and requests toenail debridement.  Patient is a history of diabetes with the history of kidney transplant Denies history of claudication, amputation of foot ulceration  Objective: Orientated 3  Vascular: No peripheral edema bilaterally DP and PT pulses 2/4 bilaterally Capillary reflex immediate bilaterally  Neurological: Sensation to 10 g monofilament wire intact 5/5 bilaterally Vibratory sensation reactive bilaterally Ankle reflex equal and reactive bilaterally  Dermatological: Absent hair growth bilaterally No open skin lesions bilaterally The toenails are extremely hypertrophic, elongated, discolored, deformed and tender to direct palpation 6-10  Musculoskeletal: Pes planus bilaterally HAV bilaterally There is no restriction ankle, subtalar, midtarsal joints bilaterally  Assessment: Satisfactory neurovascular status 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

## 2017-08-28 NOTE — Patient Instructions (Signed)

## 2017-09-13 DIAGNOSIS — H26491 Other secondary cataract, right eye: Secondary | ICD-10-CM | POA: Insufficient documentation

## 2017-11-27 ENCOUNTER — Ambulatory Visit (INDEPENDENT_AMBULATORY_CARE_PROVIDER_SITE_OTHER): Payer: Medicare Other | Admitting: Podiatry

## 2017-11-27 ENCOUNTER — Encounter: Payer: Self-pay | Admitting: Podiatry

## 2017-11-27 DIAGNOSIS — M79674 Pain in right toe(s): Secondary | ICD-10-CM | POA: Diagnosis not present

## 2017-11-27 DIAGNOSIS — M79675 Pain in left toe(s): Secondary | ICD-10-CM | POA: Diagnosis not present

## 2017-11-27 DIAGNOSIS — B351 Tinea unguium: Secondary | ICD-10-CM | POA: Diagnosis not present

## 2017-11-27 NOTE — Progress Notes (Signed)
Subjective:   Patient ID: [name], male   DOB: 72 y.o.   MRN: [id number]   HPI Patient presents with elongated nails 1-5 both feet that are incurvated sore when pressed and make shoe gear difficult   ROS      Objective:  Physical Exam  Neurovascular status intact with thick yellow brittle nailbeds 1-5 both feet that are painful     Assessment:  Mycotic nail infection 1-5 both feet with pain     Plan:  Debride painful nailbeds 1-5 both feet with no iatrogenic bleeding noted

## 2018-01-26 ENCOUNTER — Encounter (INDEPENDENT_AMBULATORY_CARE_PROVIDER_SITE_OTHER): Payer: Medicare Other

## 2018-01-26 ENCOUNTER — Ambulatory Visit (INDEPENDENT_AMBULATORY_CARE_PROVIDER_SITE_OTHER): Payer: Medicare Other | Admitting: Vascular Surgery

## 2018-02-26 ENCOUNTER — Ambulatory Visit (INDEPENDENT_AMBULATORY_CARE_PROVIDER_SITE_OTHER): Payer: Medicare Other | Admitting: Podiatry

## 2018-02-26 DIAGNOSIS — B351 Tinea unguium: Secondary | ICD-10-CM

## 2018-02-26 DIAGNOSIS — M79675 Pain in left toe(s): Secondary | ICD-10-CM

## 2018-02-26 DIAGNOSIS — E119 Type 2 diabetes mellitus without complications: Secondary | ICD-10-CM

## 2018-02-26 DIAGNOSIS — M79674 Pain in right toe(s): Secondary | ICD-10-CM | POA: Diagnosis not present

## 2018-02-28 NOTE — Progress Notes (Signed)
Subjective:   Patient ID: [name], male   DOB: 72 y.o.   MRN: [id number]   HPI Patient presents with elongated nailbeds 1-5 both feet that are thick yellow and painful to palpation   ROS      Objective:  Physical Exam  Mycotic nail infection with pain 1-5 both feet     Assessment:  Chronic mycotic nail infection with thick toenails 1-5 both feet     Plan:  Debridement of painful nails with no iatrogenic bleeding and return for routine care

## 2018-03-27 ENCOUNTER — Encounter (INDEPENDENT_AMBULATORY_CARE_PROVIDER_SITE_OTHER): Payer: Medicare Other

## 2018-03-27 ENCOUNTER — Ambulatory Visit (INDEPENDENT_AMBULATORY_CARE_PROVIDER_SITE_OTHER): Payer: Medicare Other | Admitting: Vascular Surgery

## 2018-05-28 ENCOUNTER — Ambulatory Visit (INDEPENDENT_AMBULATORY_CARE_PROVIDER_SITE_OTHER): Payer: Medicare Other | Admitting: Podiatry

## 2018-05-28 DIAGNOSIS — B351 Tinea unguium: Secondary | ICD-10-CM | POA: Diagnosis not present

## 2018-05-28 DIAGNOSIS — M79674 Pain in right toe(s): Secondary | ICD-10-CM

## 2018-05-28 DIAGNOSIS — M79675 Pain in left toe(s): Secondary | ICD-10-CM

## 2018-05-28 DIAGNOSIS — E119 Type 2 diabetes mellitus without complications: Secondary | ICD-10-CM | POA: Diagnosis not present

## 2018-05-30 NOTE — Progress Notes (Signed)
Subjective:   Patient ID: [name], male   DOB: 72 y.o.   MRN: [id number]   HPI Patient presents with severe elongation nailbeds 1-5 both feet that are painful thick and he cannot cut   ROS      Objective:  Physical Exam  Neurovascular status unchanged with thick yellow brittle nailbeds 1-5 both feet to get incurvated in the corners are sore when pressed and make shoe gear difficult 1-5 bilateral     Assessment:  Mycotic nail infection with a elongation pain 1-5 both feet     Plan:  Debride painful nailbeds 1-5 both feet with no iatrogenic bleeding noted

## 2018-07-03 ENCOUNTER — Ambulatory Visit (INDEPENDENT_AMBULATORY_CARE_PROVIDER_SITE_OTHER): Payer: Medicare Other | Admitting: Vascular Surgery

## 2018-07-03 ENCOUNTER — Ambulatory Visit (INDEPENDENT_AMBULATORY_CARE_PROVIDER_SITE_OTHER): Payer: Medicare Other

## 2018-07-03 ENCOUNTER — Encounter (INDEPENDENT_AMBULATORY_CARE_PROVIDER_SITE_OTHER): Payer: Self-pay | Admitting: Vascular Surgery

## 2018-07-03 VITALS — BP 152/80 | HR 58 | Resp 16 | Ht 72.0 in | Wt 240.0 lb

## 2018-07-03 DIAGNOSIS — E1122 Type 2 diabetes mellitus with diabetic chronic kidney disease: Secondary | ICD-10-CM | POA: Diagnosis not present

## 2018-07-03 DIAGNOSIS — Z992 Dependence on renal dialysis: Secondary | ICD-10-CM

## 2018-07-03 DIAGNOSIS — N186 End stage renal disease: Secondary | ICD-10-CM

## 2018-07-03 DIAGNOSIS — I10 Essential (primary) hypertension: Secondary | ICD-10-CM

## 2018-07-03 NOTE — Progress Notes (Signed)
MRN : [id number]  [name] is a 72 y.o. (1945/11/30) male who presents with chief complaint of  Chief Complaint  Patient presents with  . Follow-up    6 month HDA follow up  .  History of Present Illness: Patient returns today in follow up of his left arm access.  He has gotten a kidney transplant and his creatinine is currently 1.2 with excellent function.  He has not used his left arm access at all.  His duplex today does show elevated velocities at the cephalic vein subclavian vein confluence with an aneurysmal access and lower flows than would be expected from an AV fistula.  Current Outpatient Medications  Medication Sig Dispense Refill  . acetaminophen (TYLENOL) 325 MG tablet Take 650 mg by mouth every 6 (six) hours as needed (pain).     [name] allopurinol (ZYLOPRIM) 300 MG tablet Take 300 mg by mouth daily with breakfast.     . amLODipine (NORVASC) 5 MG tablet Take 5 mg by mouth daily with breakfast.     . aspirin 81 MG chewable tablet Chew 81 mg by mouth daily.    . B Complex-C-Folic Acid (DIALYVITE 619) 0.8 MG TABS Take 0.8 mg by mouth daily with breakfast.     . brimonidine (ALPHAGAN) 0.2 % ophthalmic solution Place 1 drop into the left eye 2 (two) times daily. 5 mL 12  . brinzolamide (AZOPT) 1 % ophthalmic suspension Place 1 drop into the right eye 3 (three) times daily.    . cinacalcet (SENSIPAR) 90 MG tablet Take 90 mg by mouth daily with supper.     . cloNIDine (CATAPRES) 0.1 MG tablet Take 0.1 mg by mouth 2 (two) times daily.    [name] donepezil (ARICEPT) 5 MG tablet TAKE 1 TABLET BY MOUTH EVERYDAY AT BEDTIME  6  . dorzolamide (TRUSOPT) 2 % ophthalmic solution Apply to eye.    [name] doxazosin (CARDURA) 8 MG tablet Take 4 mg by mouth daily with breakfast.     . famotidine (PEPCID) 20 MG tablet Take 20 mg by mouth 2 (two) times daily.    . furosemide (LASIX) 40 MG tablet Take 40 mg by mouth 2 (two) times daily.    [name] gatifloxacin (ZYMAXID) 0.5 % SOLN Place 1 drop into the  left eye 4 (four) times daily.    [name] glimepiride (AMARYL) 4 MG tablet Take 4 mg by mouth daily with breakfast.     . HUMALOG KWIKPEN 100 UNIT/ML KiwkPen 4 UNIT PER SLIDING SCALE  3  . HYDROcodone-acetaminophen (NORCO/VICODIN) 5-325 MG per tablet Take 1-2 tablets by mouth every 4 (four) hours as needed for moderate pain. 30 tablet 0  . Insulin Glargine (LANTUS SOLOSTAR) 100 UNIT/ML Solostar Pen 25 UNITS UNIT UNDER SKIN MORNING    . K Phos Mono-Sod Phos Di & Mono (K-PHOS-NEUTRAL) [phone] MG TABS Take by mouth.    [name] ketoconazole (NIZORAL) 2 % shampoo APPLY TO AFFECTED AREA EVERY DAY AS DIRECTED  2  . KLOR-CON M20 20 MEQ tablet Take 20 mEq by mouth daily.  1  . latanoprost (XALATAN) 0.005 % ophthalmic solution Place 1 drop into the right eye at bedtime.     [name] linagliptin (TRADJENTA) 5 MG TABS tablet Take 5 mg by mouth daily with breakfast.     . losartan (COZAAR) 100 MG tablet Take 100 mg by mouth daily.    . metoprolol (LOPRESSOR) 50 MG tablet Take 25 mg by mouth 2 (two) times daily.    [name]  mycophenolate (MYFORTIC) 360 MG TBEC EC tablet     . nepafenac (ILEVRO) 0.3 % ophthalmic suspension Place 1 drop into the left eye at bedtime.    . prednisoLONE acetate (PRED FORTE) 1 % ophthalmic suspension Place 1 drop into the left eye 4 (four) times daily. 5 mL 0  . sodium chloride (OCEAN) 0.65 % SOLN nasal spray Place 2 sprays into both nostrils daily as needed for congestion.     . sucroferric oxyhydroxide (VELPHORO) 500 MG chewable tablet Chew 500 mg by mouth 3 (three) times daily with meals.    . tacrolimus (PROGRAF) 1 MG capsule TAKE 3 CAPSULES BY MOUTH 2 TIMES DAILY (3.5MG  IN AM AND 3.5MG  IN PM TOTAL)  11  . tadalafil (CIALIS) 20 MG tablet Take 20 mg by mouth once a week. Monday evenings after supper    . timolol (TIMOPTIC) 0.5 % ophthalmic solution Place 1 drop into the right eye 2 (two) times daily. 10 mL 12  . Vitamin D, Ergocalciferol, (DRISDOL) 50000 units CAPS capsule Take by mouth.     No  current facility-administered medications for this visit.     Past Medical History:  Diagnosis Date  . Anemia    hx of at beginnning of dialysis- 05/2009  . Arthritis    right hip   . End stage renal disease ([place])    [place]   . End stage renal disease on dialysis ([place])   . Essential hypertension, malignant   . Hypertrophy of prostate without urinary obstruction and other lower urinary tract symptoms (LUTS)   . Pure hypercholesterolemia   . Renal insufficiency   . Type II or unspecified type diabetes mellitus without mention of complication, not stated as uncontrolled    TYPE 2     Past Surgical History:  Procedure Laterality Date  . A/V FISTULAGRAM Left 06/12/2017   Procedure: A/V Fistulagram;  Surgeon: Algernon Huxley, MD;  Location: [facility];  Service: Cardiovascular;  Laterality: Left;  . CARDIAC CATHETERIZATION  04/29/10   normal coronaries, LVEF 45%, mild global LV hypokinesis ([place])  . CATARACT EXTRACTION W/PHACO Right 04/03/2013   Procedure: CATARACT EXTRACTION PHACO AND INTRAOCULAR LENS PLACEMENT (IOC);  Surgeon: Adonis Brook, MD;  Location: [facility];  Service: Ophthalmology;  Laterality: Right;  . CERVICAL DISCECTOMY  03/1999  . COLONOSCOPY     Hx: of  . CYSTOSCOPY WITH BIOPSY  11/01/2012   Procedure: CYSTOSCOPY WITH BIOPSY;  Surgeon: Molli Hazard, MD;  Location: [facility];  Service: Urology;  Laterality: N/A;  with fulgeration  . CYSTOSCOPY WITH RETROGRADE PYELOGRAM, URETEROSCOPY AND STENT PLACEMENT  11/01/2012   Procedure: CYSTOSCOPY WITH RETROGRADE PYELOGRAM, URETEROSCOPY AND STENT PLACEMENT;  Surgeon: Molli Hazard, MD;  Location: [facility];  Service: Urology;  Laterality: Bilateral;  . DIALYSIS FISTULA CREATION  2010  . EYE SURGERY  2012   cataracts  . EYE SURGERY  11/2014  . HOLMIUM LASER APPLICATION  3/33/5456   Procedure: HOLMIUM LASER APPLICATION;  Surgeon: Molli Hazard, MD;  Location: [facility];   Service: Urology;  Laterality: Bilateral;  . KIDNEY TRANSPLANT  2016  . MEMBRANE PEEL Right 09/04/2013   Procedure: MEMBRANE PEEL;  Surgeon: Adonis Brook, MD;  Location: [facility];  Service: Ophthalmology;  Laterality: Right;  . NECK SURGERY  1990  . PARS PLANA VITRECTOMY Right 09/04/2013   Procedure: PARS PLANA VITRECTOMY WITH 23 GAUGE;  Surgeon: Adonis Brook, MD;  Location: [facility];  Service: Ophthalmology;  Laterality: Right;  . PARS PLANA VITRECTOMY Left 02/17/2015  . PARS PLANA VITRECTOMY Left 02/17/2015   Procedure: PARS PLANA VITRECTOMY WITH 25G REMOVAL/SUTURE INTRAOCULAR LENS;  Surgeon: Hayden Pedro, MD;  Location: [facility];  Service: Ophthalmology;  Laterality: Left;  . PERIPHERAL VASCULAR CATHETERIZATION Left 08/08/2016   Procedure: A/V Shuntogram/Fistulagram;  Surgeon: Algernon Huxley, MD;  Location: [facility];  Service: Cardiovascular;  Laterality: Left;  . PERIPHERAL VASCULAR CATHETERIZATION N/A 08/08/2016   Procedure: A/V Shunt Intervention;  Surgeon: Algernon Huxley, MD;  Location: [facility];  Service: Cardiovascular;  Laterality: N/A;  . PHOTOCOAGULATION WITH LASER Right 09/04/2013   Procedure: PHOTOCOAGULATION WITH LASER;  Surgeon: Adonis Brook, MD;  Location: [facility];  Service: Ophthalmology;  Laterality: Right;  ENDOLASER  . TRANSTHORACIC ECHOCARDIOGRAM  06/19/2012   normal LV sys function, EF 55-60%, mild to mod diastolic dysfunction, mild AI ([place])    Social History  Substance Use Topics  . Smoking status: Former Smoker    Types: Cigarettes    Quit date: 10/02/1990  . Smokeless tobacco: Never Used  . Alcohol use Yes      Comment: occasional    Family History      Family History  Problem Relation Age of Onset  . Breast cancer Mother 28   deceased  . Pneumonia Father 43   deceased  . Hypertension Brother   . Diabetes Mellitus II Brother         Allergies  Allergen Reactions  . Codeine Nausea And Vomiting  and Nausea Only  . Doxycycline Nausea And Vomiting and Nausea Only  . Latex Hives and Rash     REVIEW OF SYSTEMS(Negative unless checked)  Constitutional: [] Weight loss[] Fever[] Chills Cardiac:[] Chest pain[] Chest pressure[] Palpitations [] Shortness of breath when laying flat [] Shortness of breath at rest [] Shortness of breath with exertion. Vascular: [] Pain in legs with walking[] Pain in legsat rest[] Pain in legs when laying flat [] Claudication [] Pain in feet when walking [] Pain in feet at rest [] Pain in feet when laying flat [] History of DVT [] Phlebitis [] Swelling in legs [] Varicose veins [] Non-healing ulcers Pulmonary: [] Uses home oxygen [] Productive cough[] Hemoptysis [] Wheeze [] COPD [] Asthma Neurologic: [] Dizziness [] Blackouts [] Seizures [] History of stroke [] History of TIA[] Aphasia [] Temporary blindness[] Dysphagia [] Weaknessor numbness in arms [] Weakness or numbnessin legs Musculoskeletal: [x] Arthritis [] Joint swelling [] Joint pain [] Low back pain Hematologic:[] Easy bruising[] Easy bleeding [] Hypercoagulable state [] Anemic  Gastrointestinal:[] Blood in stool[] Vomiting blood[] Gastroesophageal reflux/heartburn[] Abdominal pain Genitourinary: [x] Chronic kidney disease [] Difficulturination [] Frequenturination [] Burning with urination[] Hematuria Skin: [] Rashes [] Ulcers [] Wounds Psychological: [] History of anxiety[] History of major depression.    Physical Examination  BP (!) 152/80 (BP Location: Right Arm, Patient Position: Sitting)   Pulse (!) 58   Resp 16   Ht 6' (1.829 m)   Wt 240 lb (108.9 kg)   BMI 32.55 kg/m  Gen:  WD/WN, NAD Head: [place]/AT, No temporalis wasting. Ear/Nose/Throat: Hearing grossly intact, nares w/o erythema or drainage Eyes: Conjunctiva clear. Sclera non-icteric Neck: Supple.  Trachea midline Pulmonary:  Good air movement, no use of accessory  muscles.  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  No deformity or atrophy. Trace LE edema. Neurologic: Sensation grossly intact in extremities.  Symmetrical.  Speech is fluent.  Psychiatric: Judgment intact, Mood & affect appropriate  for pt's clinical situation. Dermatologic: No rashes or ulcers noted.  No cellulitis or open wounds.       Labs No results found for this or any previous visit (from the past 2160 hour(s)).  Radiology No results found.  Assessment/Plan Diabetes mellitus blood glucose control important in reducing the progression of atherosclerotic disease. Also, involved in wound healing. On appropriate medications.   HTN (hypertension) blood pressure control important in reducing the progression of atherosclerotic disease. On appropriate oral medications.  End stage renal disease ([place]) He now has a renal transplant which is functioning very well. His duplex today does show elevated velocities at the cephalic vein subclavian vein confluence with an aneurysmal access and lower flows than would be expected from an AV fistula.  He and I had a good discussion today.  Rather than asked to treat a fistula he would likely not need for many years, I think leaving this alone at this point is probably the best option.  If it thrombosis, we can assess him for new access if he ever developed significant renal insufficiency.  I will see him back as needed.    Leotis Pain, MD  07/03/2018 5:06 PM    This note was created with Dragon medical transcription system.  Any errors from dictation are purely unintentional

## 2018-07-03 NOTE — Assessment & Plan Note (Signed)
He now has a renal transplant which is functioning very well. His duplex today does show elevated velocities at the cephalic vein subclavian vein confluence with an aneurysmal access and lower flows than would be expected from an AV fistula.  He and I had a good discussion today.  Rather than asked to treat a fistula he would likely not need for many years, I think leaving this alone at this point is probably the best option.  If it thrombosis, we can assess him for new access if he ever developed significant renal insufficiency.  I will see him back as needed.

## 2018-08-28 ENCOUNTER — Ambulatory Visit (INDEPENDENT_AMBULATORY_CARE_PROVIDER_SITE_OTHER): Payer: Medicare Other | Admitting: Podiatry

## 2018-08-28 ENCOUNTER — Encounter: Payer: Self-pay | Admitting: Podiatry

## 2018-08-28 DIAGNOSIS — E0822 Diabetes mellitus due to underlying condition with diabetic chronic kidney disease: Secondary | ICD-10-CM | POA: Diagnosis not present

## 2018-08-28 DIAGNOSIS — M79675 Pain in left toe(s): Secondary | ICD-10-CM

## 2018-08-28 DIAGNOSIS — B351 Tinea unguium: Secondary | ICD-10-CM

## 2018-08-28 DIAGNOSIS — Z794 Long term (current) use of insulin: Secondary | ICD-10-CM

## 2018-08-28 DIAGNOSIS — N186 End stage renal disease: Secondary | ICD-10-CM

## 2018-08-28 DIAGNOSIS — M79674 Pain in right toe(s): Secondary | ICD-10-CM | POA: Diagnosis not present

## 2018-08-28 DIAGNOSIS — Z992 Dependence on renal dialysis: Secondary | ICD-10-CM

## 2018-08-28 NOTE — Progress Notes (Signed)
Subjective: [name] presents today with diabetes and cc of painful, discolored, thick toenails which interfere with daily activities and routine tasks.  Pain is aggravated when wearing enclosed shoe gear. Pain is relieved with periodic professional debridement.  Objective: Vascular Examination: Capillary refill time <3 seconds x 10 digits Dorsalis pedis and posterior tibial pulses present b/l No digital hair x 10 digits Skin temperature warm to warm b/l  Dermatological Examination: Skin with normal turgor, texture and tone b/l Toenails 1-5 b/l discolored, thick, dystrophic with subungual debris and pain with palpation to nailbeds due to thickness of nails.  Musculoskeletal: Muscle strength 5/5 to all LE muscle groups HAV with bunion deformity b/l  Neurological: Sensation intact with 10 gram monofilament. Vibratory sensation intact.  Assessment: 1. Painful onychomycosis toenails 1-5 b/l  2. 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 Toenails 1-5 b/l were debrided in length and girth without iatrogenic bleeding. 2. Patient to continue soft, supportive shoe gear 3. Patient to report any pedal injuries to medical professional immediately. 4. Follow up 3 months. Patient/POA to call should there be a concern in the interim.

## 2018-08-28 NOTE — Patient Instructions (Signed)

## 2018-11-27 ENCOUNTER — Ambulatory Visit: Payer: Medicare Other | Admitting: Podiatry

## 2018-12-13 ENCOUNTER — Encounter: Payer: Self-pay | Admitting: Podiatry

## 2018-12-13 ENCOUNTER — Ambulatory Visit (INDEPENDENT_AMBULATORY_CARE_PROVIDER_SITE_OTHER): Payer: Medicare Other | Admitting: Podiatry

## 2018-12-13 DIAGNOSIS — M79675 Pain in left toe(s): Secondary | ICD-10-CM

## 2018-12-13 DIAGNOSIS — L84 Corns and callosities: Secondary | ICD-10-CM

## 2018-12-13 DIAGNOSIS — N186 End stage renal disease: Secondary | ICD-10-CM

## 2018-12-13 DIAGNOSIS — Z794 Long term (current) use of insulin: Secondary | ICD-10-CM

## 2018-12-13 DIAGNOSIS — E0822 Diabetes mellitus due to underlying condition with diabetic chronic kidney disease: Secondary | ICD-10-CM | POA: Diagnosis not present

## 2018-12-13 DIAGNOSIS — B351 Tinea unguium: Secondary | ICD-10-CM | POA: Diagnosis not present

## 2018-12-13 DIAGNOSIS — M79674 Pain in right toe(s): Secondary | ICD-10-CM | POA: Diagnosis not present

## 2018-12-13 DIAGNOSIS — Z992 Dependence on renal dialysis: Secondary | ICD-10-CM

## 2018-12-22 NOTE — Progress Notes (Signed)
Subjective: [name] presents today with painful, thick toenails 1-5 b/l that he cannot cut and which interfere with daily activities.  Pain is aggravated when wearing enclosed shoe gear.  Rogers Blocker, MD is his PCP.   Current Outpatient Medications:  .  acetaminophen (TYLENOL) 325 MG tablet, Take 650 mg by mouth every 6 (six) hours as needed (pain). , Disp: , Rfl:  .  allopurinol (ZYLOPRIM) 300 MG tablet, Take 300 mg by mouth daily with breakfast. , Disp: , Rfl:  .  amLODipine (NORVASC) 5 MG tablet, Take 5 mg by mouth daily with breakfast. , Disp: , Rfl:  .  aspirin 81 MG chewable tablet, Chew 81 mg by mouth daily., Disp: , Rfl:  .  B Complex-C-Folic Acid (DIALYVITE 226) 0.8 MG TABS, Take 0.8 mg by mouth daily with breakfast. , Disp: , Rfl:  .  brimonidine (ALPHAGAN) 0.2 % ophthalmic solution, Place 1 drop into the left eye 2 (two) times daily., Disp: 5 mL, Rfl: 12 .  brinzolamide (AZOPT) 1 % ophthalmic suspension, Place 1 drop into the right eye 3 (three) times daily., Disp: , Rfl:  .  cinacalcet (SENSIPAR) 90 MG tablet, Take 90 mg by mouth daily with supper. , Disp: , Rfl:  .  cloNIDine (CATAPRES) 0.1 MG tablet, Take 0.1 mg by mouth 2 (two) times daily., Disp: , Rfl:  .  donepezil (ARICEPT) 5 MG tablet, TAKE 1 TABLET BY MOUTH EVERYDAY AT BEDTIME, Disp: , Rfl: 6 .  doxazosin (CARDURA) 8 MG tablet, Take 4 mg by mouth daily with breakfast. , Disp: , Rfl:  .  famotidine (PEPCID) 20 MG tablet, Take 20 mg by mouth 2 (two) times daily., Disp: , Rfl:  .  furosemide (LASIX) 40 MG tablet, Take 40 mg by mouth 2 (two) times daily., Disp: , Rfl:  .  gatifloxacin (ZYMAXID) 0.5 % SOLN, Place 1 drop into the left eye 4 (four) times daily., Disp: , Rfl:  .  glimepiride (AMARYL) 4 MG tablet, Take 4 mg by mouth daily with breakfast. , Disp: , Rfl:  .  HUMALOG KWIKPEN 100 UNIT/ML KiwkPen, 4 UNIT PER SLIDING SCALE, Disp: , Rfl: 3 .  HYDROcodone-acetaminophen (NORCO/VICODIN) 5-325 MG per tablet, Take  1-2 tablets by mouth every 4 (four) hours as needed for moderate pain., Disp: 30 tablet, Rfl: 0 .  Insulin Glargine (LANTUS SOLOSTAR) 100 UNIT/ML Solostar Pen, 25 UNITS UNIT UNDER SKIN MORNING, Disp: , Rfl:  .  K Phos Mono-Sod Phos Di & Mono (K-PHOS-NEUTRAL) [phone] MG TABS, Take by mouth., Disp: , Rfl:  .  ketoconazole (NIZORAL) 2 % shampoo, APPLY TO AFFECTED AREA EVERY DAY AS DIRECTED, Disp: , Rfl: 2 .  KLOR-CON M20 20 MEQ tablet, Take 20 mEq by mouth daily., Disp: , Rfl: 1 .  latanoprost (XALATAN) 0.005 % ophthalmic solution, Place 1 drop into the right eye at bedtime. , Disp: , Rfl:  .  linagliptin (TRADJENTA) 5 MG TABS tablet, Take 5 mg by mouth daily with breakfast. , Disp: , Rfl:  .  losartan (COZAAR) 100 MG tablet, Take 100 mg by mouth daily., Disp: , Rfl:  .  metoprolol (LOPRESSOR) 50 MG tablet, Take 25 mg by mouth 2 (two) times daily., Disp: , Rfl:  .  mycophenolate (MYFORTIC) 360 MG TBEC EC tablet, , Disp: , Rfl:  .  nepafenac (ILEVRO) 0.3 % ophthalmic suspension, Place 1 drop into the left eye at bedtime., Disp: , Rfl:  .  prednisoLONE acetate (PRED FORTE) 1 %  ophthalmic suspension, Place 1 drop into the left eye 4 (four) times daily., Disp: 5 mL, Rfl: 0 .  sodium chloride (OCEAN) 0.65 % SOLN nasal spray, Place 2 sprays into both nostrils daily as needed for congestion. , Disp: , Rfl:  .  sucroferric oxyhydroxide (VELPHORO) 500 MG chewable tablet, Chew 500 mg by mouth 3 (three) times daily with meals., Disp: , Rfl:  .  tacrolimus (PROGRAF) 1 MG capsule, TAKE 3 CAPSULES BY MOUTH 2 TIMES DAILY (3.5MG  IN AM AND 3.5MG  IN PM TOTAL), Disp: , Rfl: 11 .  tadalafil (CIALIS) 20 MG tablet, Take 20 mg by mouth once a week. Monday evenings after supper, Disp: , Rfl:  .  timolol (TIMOPTIC) 0.5 % ophthalmic solution, Place 1 drop into the right eye 2 (two) times daily., Disp: 10 mL, Rfl: 12 .  Vitamin D, Ergocalciferol, (DRISDOL) 50000 units CAPS capsule, Take by mouth., Disp: , Rfl:   Allergies   Allergen Reactions  . Codeine Nausea And Vomiting and Nausea Only  . Doxycycline Nausea And Vomiting and Nausea Only  . Latex Hives and Rash    Objective:  Vascular Examination: Capillary refill time <3 seconds x 10 digits  Dorsalis pedis and Posterior tibial pulses palpable b/l.  Digital hair absent x 10 digits.  Skin temperature gradient WNL b/l.  Dermatological Examination: Skin with normal turgor, texture and tone b/l.  Toenails 1-5 b/l discolored, thick, dystrophic with subungual debris and pain with palpation to nailbeds due to thickness of nails.  Hyperkeratotic lesion distal tip left 2nd, right great toe, right 2nd digit with no erythema, no edema, no drainage.  Musculoskeletal: Muscle strength 5/5 to all LE muscle groups.  HAV with bunion deformity b/l.  No pain or crepitus with ROM noted b/l.  Neurological: Sensation intact with 10 gram monofilament.  Vibratory sensation intact.  Assessment: 1. Painful onychomycosis toenails 1-5 b/l  2. Corn distal tip b/l 2nd and distal right great toe 3.  NIDDM with ESRD on hemodialysis with long term use of insulin  Plan: 1. Toenails 1-5 b/l were debrided in length and girth without iatrogenic bleeding. 2. Corns distal tip b/l 2nd, distal right great toe pared without incident. 3. Patient to continue soft, supportive shoe gear 4. Patient to report any pedal injuries to medical professional immediately. 5. Follow up 3 months. Patient/POA to call should there be a concern in the interim.

## 2019-03-14 ENCOUNTER — Ambulatory Visit (INDEPENDENT_AMBULATORY_CARE_PROVIDER_SITE_OTHER): Payer: Medicare Other | Admitting: Podiatry

## 2019-03-14 ENCOUNTER — Encounter: Payer: Self-pay | Admitting: Podiatry

## 2019-03-14 ENCOUNTER — Other Ambulatory Visit: Payer: Self-pay

## 2019-03-14 DIAGNOSIS — L84 Corns and callosities: Secondary | ICD-10-CM | POA: Diagnosis not present

## 2019-03-14 DIAGNOSIS — M79674 Pain in right toe(s): Secondary | ICD-10-CM | POA: Diagnosis not present

## 2019-03-14 DIAGNOSIS — Z794 Long term (current) use of insulin: Secondary | ICD-10-CM

## 2019-03-14 DIAGNOSIS — B351 Tinea unguium: Secondary | ICD-10-CM

## 2019-03-14 DIAGNOSIS — M79675 Pain in left toe(s): Secondary | ICD-10-CM

## 2019-03-14 DIAGNOSIS — E119 Type 2 diabetes mellitus without complications: Secondary | ICD-10-CM | POA: Diagnosis not present

## 2019-03-14 NOTE — Patient Instructions (Signed)
Diabetes Mellitus and Foot Care Foot care is an important part of your health, especially when you have diabetes. Diabetes may cause you to have problems because of poor blood flow (circulation) to your feet and legs, which can cause your skin to:  Become thinner and drier.  Break more easily.  Heal more slowly.  Peel and crack. You may also have nerve damage (neuropathy) in your legs and feet, causing decreased feeling in them. This means that you may not notice minor injuries to your feet that could lead to more serious problems. Noticing and addressing any potential problems early is the best way to prevent future foot problems. How to care for your feet Foot hygiene  Wash your feet daily with warm water and mild soap. Do not use hot water. Then, pat your feet and the areas between your toes until they are completely dry. Do not soak your feet as this can dry your skin.  Trim your toenails straight across. Do not dig under them or around the cuticle. File the edges of your nails with an emery board or nail file.  Apply a moisturizing lotion or petroleum jelly to the skin on your feet and to dry, brittle toenails. Use lotion that does not contain alcohol and is unscented. Do not apply lotion between your toes. Shoes and socks  Wear clean socks or stockings every day. Make sure they are not too tight. Do not wear knee-high stockings since they may decrease blood flow to your legs.  Wear shoes that fit properly and have enough cushioning. Always look in your shoes before you put them on to be sure there are no objects inside.  To break in new shoes, wear them for just a few hours a day. This prevents injuries on your feet. Wounds, scrapes, corns, and calluses  Check your feet daily for blisters, cuts, bruises, sores, and redness. If you cannot see the bottom of your feet, use a mirror or ask someone for help.  Do not cut corns or calluses or try to remove them with medicine.  If you  find a minor scrape, cut, or break in the skin on your feet, keep it and the skin around it clean and dry. You may clean these areas with mild soap and water. Do not clean the area with peroxide, alcohol, or iodine.  If you have a wound, scrape, corn, or callus on your foot, look at it several times a day to make sure it is healing and not infected. Check for: ? Redness, swelling, or pain. ? Fluid or blood. ? Warmth. ? Pus or a bad smell. General instructions  Do not cross your legs. This may decrease blood flow to your feet.  Do not use heating pads or hot water bottles on your feet. They may burn your skin. If you have lost feeling in your feet or legs, you may not know this is happening until it is too late.  Protect your feet from hot and cold by wearing shoes, such as at the beach or on hot pavement.  Schedule a complete foot exam at least once a year (annually) or more often if you have foot problems. If you have foot problems, report any cuts, sores, or bruises to your health care provider immediately. Contact a health care provider if:  You have a medical condition that increases your risk of infection and you have any cuts, sores, or bruises on your feet.  You have an injury that is not   healing.  You have redness on your legs or feet.  You feel burning or tingling in your legs or feet.  You have pain or cramps in your legs and feet.  Your legs or feet are numb.  Your feet always feel cold.  You have pain around a toenail. Get help right away if:  You have a wound, scrape, corn, or callus on your foot and: ? You have pain, swelling, or redness that gets worse. ? You have fluid or blood coming from the wound, scrape, corn, or callus. ? Your wound, scrape, corn, or callus feels warm to the touch. ? You have pus or a bad smell coming from the wound, scrape, corn, or callus. ? You have a fever. ? You have a red line going up your leg. Summary  Check your feet every day  for cuts, sores, red spots, swelling, and blisters.  Moisturize feet and legs daily.  Wear shoes that fit properly and have enough cushioning.  If you have foot problems, report any cuts, sores, or bruises to your health care provider immediately.  Schedule a complete foot exam at least once a year (annually) or more often if you have foot problems. This information is not intended to replace advice given to you by your health care provider. Make sure you discuss any questions you have with your health care provider. Document Released: 09/30/2000 Document Revised: 11/15/2017 Document Reviewed: 11/04/2016 Elsevier Interactive Patient Education  2019 [place] are small areas of thickened skin that occur on the top, sides, or tip of a toe. They contain a cone-shaped core with a point that can press on a nerve below. This causes pain.  Calluses are areas of thickened skin that can occur anywhere on the body, including the hands, fingers, palms, soles of the feet, and heels. Calluses are usually larger than corns. What are the causes? Corns and calluses are caused by rubbing (friction) or pressure, such as from shoes that are too tight or do not fit properly. What increases the risk? Corns are more likely to develop in people who have misshapen toes (toe deformities), such as hammer toes. Calluses can occur with friction to any area of the skin. They are more likely to develop in people who:  Work with their hands.  Wear shoes that fit poorly, are too tight, or are high-heeled.  Have toe deformities. What are the signs or symptoms? Symptoms of a corn or callus include:  A hard growth on the skin.  Pain or tenderness under the skin.  Redness and swelling.  Increased discomfort while wearing tight-fitting shoes, if your feet are affected. If a corn or callus becomes infected, symptoms may include:  Redness and swelling that gets worse.  Pain.   Fluid, blood, or pus draining from the corn or callus. How is this diagnosed? Corns and calluses may be diagnosed based on your symptoms, your medical history, and a physical exam. How is this treated? Treatment for corns and calluses may include:  Removing the cause of the friction or pressure. This may involve: ? Changing your shoes. ? Wearing shoe inserts (orthotics) or other protective layers in your shoes, such as a corn pad. ? Wearing gloves.  Applying medicine to the skin (topical medicine) to help soften skin in the hardened, thickened areas.  Removing layers of dead skin with a file to reduce the size of the corn or callus.  Removing the corn or callus with a scalpel  or laser.  Taking antibiotic medicines, if your corn or callus is infected.  Having surgery, if a toe deformity is the cause. Follow these instructions at home:   Take over-the-counter and prescription medicines only as told by your health care provider.  If you were prescribed an antibiotic, take it as told by your health care provider. Do not stop taking it even if your condition starts to improve.  Wear shoes that fit well. Avoid wearing high-heeled shoes and shoes that are too tight or too loose.  Wear any padding, protective layers, gloves, or orthotics as told by your health care provider.  Soak your hands or feet and then use a file or pumice stone to soften your corn or callus. Do this as told by your health care provider.  Check your corn or callus every day for symptoms of infection. Contact a health care provider if you:  Notice that your symptoms do not improve with treatment.  Have redness or swelling that gets worse.  Notice that your corn or callus becomes painful.  Have fluid, blood, or pus coming from your corn or callus.  Have new symptoms. Summary  Corns are small areas of thickened skin that occur on the top, sides, or tip of a toe.  Calluses are areas of thickened skin that  can occur anywhere on the body, including the hands, fingers, palms, and soles of the feet. Calluses are usually larger than corns.  Corns and calluses are caused by rubbing (friction) or pressure, such as from shoes that are too tight or do not fit properly.  Treatment may include wearing any padding, protective layers, gloves, or orthotics as told by your health care provider. This information is not intended to replace advice given to you by your health care provider. Make sure you discuss any questions you have with your health care provider. Document Released: 07/09/2004 Document Revised: 08/16/2017 Document Reviewed: 08/16/2017 Elsevier Interactive Patient Education  2019 [employer]  Onychomycosis/Fungal Toenails  WHAT IS IT? An infection that lies within the keratin of your nail plate that is caused by a fungus.  WHY ME? Fungal infections affect all ages, sexes, races, and creeds.  There may be many factors that predispose you to a fungal infection such as age, coexisting medical conditions such as diabetes, or an autoimmune disease; stress, medications, fatigue, genetics, etc.  Bottom line: fungus thrives in a warm, moist environment and your shoes offer such a location.  IS IT CONTAGIOUS? Theoretically, yes.  You do not want to share shoes, nail clippers or files with someone who has fungal toenails.  Walking around barefoot in the same room or sleeping in the same bed is unlikely to transfer the organism.  It is important to realize, however, that fungus can spread easily from one nail to the next on the same foot.  HOW DO WE TREAT THIS?  There are several ways to treat this condition.  Treatment may depend on many factors such as age, medications, pregnancy, liver and kidney conditions, etc.  It is best to ask your doctor which options are available to you.  1. No treatment.   Unlike many other medical concerns, you can live with this condition.  However for many people this can be  a painful condition and may lead to ingrown toenails or a bacterial infection.  It is recommended that you keep the nails cut short to help reduce the amount of fungal nail. 2. Topical treatment.  These range from herbal remedies  to prescription strength nail lacquers.  About 40-50% effective, topicals require twice daily application for approximately 9 to 12 months or until an entirely new nail has grown out.  The most effective topicals are medical grade medications available through physicians offices. 3. Oral antifungal medications.  With an 80-90% cure rate, the most common oral medication requires 3 to 4 months of therapy and stays in your system for a year as the new nail grows out.  Oral antifungal medications do require blood work to make sure it is a safe drug for you.  A liver function panel will be performed prior to starting the medication and after the first month of treatment.  It is important to have the blood work performed to avoid any harmful side effects.  In general, this medication safe but blood work is required. 4. Laser Therapy.  This treatment is performed by applying a specialized laser to the affected nail plate.  This therapy is noninvasive, fast, and non-painful.  It is not covered by insurance and is therefore, out of pocket.  The results have been very good with a 80-95% cure rate.  The [facility] is the only practice in the area to offer this therapy. 5. Permanent Nail Avulsion.  Removing the entire nail so that a new nail will not grow back.

## 2019-03-14 NOTE — Progress Notes (Signed)
Subjective: [name] is a 73 y.o. y.o. male who presents today for preventative diabetic foot care with cc of painful, discolored, thick toenails and distal digital corns which interfere with daily activities and/or pose a risk due to his diabetes. Pain is aggravated when wearing enclosed shoe gear and relieved with periodic professional debridement.  Rogers Blocker, MD is his PCP.  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 Last seen by [facility] 06/30/2018.  [name] states his last A1c was 6.9%.   Current Outpatient Medications:  .  dorzolamide (TRUSOPT) 2 % ophthalmic solution, INSTILL 1 DROP INTO BOTH EYES TWICE A DAY, Disp: , Rfl:  .  acetaminophen (TYLENOL) 325 MG tablet, Take 650 mg by mouth every 6 (six) hours as needed (pain). , Disp: , Rfl:  .  allopurinol (ZYLOPRIM) 300 MG tablet, Take 300 mg by mouth daily with breakfast. , Disp: , Rfl:  .  amLODipine (NORVASC) 2.5 MG tablet, Take 2.5 mg by mouth daily., Disp: , Rfl:  .  amLODipine (NORVASC) 5 MG tablet, Take 5 mg by mouth daily with breakfast. , Disp: , Rfl:  .  aspirin 81 MG chewable tablet, Chew 81 mg by mouth daily., Disp: , Rfl:  .  B Complex-C-Folic Acid (DIALYVITE 741) 0.8 MG TABS, Take 0.8 mg by mouth daily with breakfast. , Disp: , Rfl:  .  brimonidine (ALPHAGAN) 0.2 % ophthalmic solution, Place 1 drop into the left eye 2 (two) times daily., Disp: 5 mL, Rfl: 12 .  brinzolamide (AZOPT) 1 % ophthalmic suspension, Place 1 drop into the right eye 3 (three) times daily., Disp: , Rfl:  .  cinacalcet (SENSIPAR) 90 MG tablet, Take 90 mg by mouth daily with supper. , Disp: , Rfl:  .  cloNIDine (CATAPRES) 0.1 MG tablet, Take 0.1 mg by mouth 2 (two) times daily., Disp: , Rfl:  .  donepezil (ARICEPT) 5 MG tablet, TAKE 1 TABLET BY MOUTH EVERYDAY AT BEDTIME, Disp: , Rfl: 6 .  doxazosin (CARDURA) 4 MG tablet, Take 4 mg by mouth 2 (two) times daily., Disp: , Rfl:   .  doxazosin (CARDURA) 8 MG tablet, Take 4 mg by mouth daily with breakfast. , Disp: , Rfl:  .  famotidine (PEPCID) 20 MG tablet, Take 20 mg by mouth 2 (two) times daily., Disp: , Rfl:  .  furosemide (LASIX) 40 MG tablet, Take 40 mg by mouth 2 (two) times daily., Disp: , Rfl:  .  gatifloxacin (ZYMAXID) 0.5 % SOLN, Place 1 drop into the left eye 4 (four) times daily., Disp: , Rfl:  .  glimepiride (AMARYL) 4 MG tablet, Take 4 mg by mouth daily with breakfast. , Disp: , Rfl:  .  HUMALOG KWIKPEN 100 UNIT/ML KiwkPen, 4 UNIT PER SLIDING SCALE, Disp: , Rfl: 3 .  HYDROcodone-acetaminophen (NORCO/VICODIN) 5-325 MG per tablet, Take 1-2 tablets by mouth every 4 (four) hours as needed for moderate pain., Disp: 30 tablet, Rfl: 0 .  Insulin Glargine (LANTUS SOLOSTAR) 100 UNIT/ML Solostar Pen, 25 UNITS UNIT UNDER SKIN MORNING, Disp: , Rfl:  .  K Phos Mono-Sod Phos Di & Mono (K-PHOS-NEUTRAL) [phone] MG TABS, Take by mouth., Disp: , Rfl:  .  ketoconazole (NIZORAL) 2 % shampoo, APPLY TO AFFECTED AREA EVERY DAY AS DIRECTED, Disp: , Rfl: 2 .  KLOR-CON M20 20 MEQ tablet, Take 20 mEq by mouth daily., Disp: , Rfl: 1 .  latanoprost (XALATAN) 0.005 %  ophthalmic solution, Place 1 drop into the right eye at bedtime. , Disp: , Rfl:  .  linagliptin (TRADJENTA) 5 MG TABS tablet, Take 5 mg by mouth daily with breakfast. , Disp: , Rfl:  .  losartan (COZAAR) 100 MG tablet, Take 100 mg by mouth daily., Disp: , Rfl:  .  metoprolol (LOPRESSOR) 50 MG tablet, Take 25 mg by mouth 2 (two) times daily., Disp: , Rfl:  .  mycophenolate (MYFORTIC) 360 MG TBEC EC tablet, , Disp: , Rfl:  .  nepafenac (ILEVRO) 0.3 % ophthalmic suspension, Place 1 drop into the left eye at bedtime., Disp: , Rfl:  .  prednisoLONE acetate (PRED FORTE) 1 % ophthalmic suspension, Place 1 drop into the left eye 4 (four) times daily., Disp: 5 mL, Rfl: 0 .  sodium chloride (OCEAN) 0.65 % SOLN nasal spray, Place 2 sprays into both nostrils daily as needed for  congestion. , Disp: , Rfl:  .  sucroferric oxyhydroxide (VELPHORO) 500 MG chewable tablet, Chew 500 mg by mouth 3 (three) times daily with meals., Disp: , Rfl:  .  sulfamethoxazole-trimethoprim (BACTRIM) 400-80 MG tablet, Take 1 tablet by mouth daily., Disp: , Rfl:  .  tacrolimus (PROGRAF) 0.5 MG capsule, Take 0.5 mg by mouth 2 (two) times daily., Disp: , Rfl:  .  tacrolimus (PROGRAF) 1 MG capsule, TAKE 3 CAPSULES BY MOUTH 2 TIMES DAILY (3.5MG  IN AM AND 3.5MG  IN PM TOTAL), Disp: , Rfl: 11 .  tadalafil (CIALIS) 20 MG tablet, Take 20 mg by mouth once a week. Monday evenings after supper, Disp: , Rfl:  .  timolol (TIMOPTIC) 0.5 % ophthalmic solution, Place 1 drop into the right eye 2 (two) times daily., Disp: 10 mL, Rfl: 12 .  Vitamin D, Ergocalciferol, (DRISDOL) 50000 units CAPS capsule, Take by mouth., Disp: , Rfl:   Allergies  Allergen Reactions  . Codeine Nausea And Vomiting and Nausea Only  . Doxycycline Nausea And Vomiting and Nausea Only  . Latex Hives and Rash    Objective: Temperature: 97.7 degrees farenheit  Vascular Examination: Capillary refill time <3 seconds x 10 digits.  Dorsalis pedis pulses palpable.  Posterior tibial pulses palpable.  Digital hair absent x 10 digits.  Skin temperature gradient WNL b/l.  Dermatological Examination: Skin with normal turgor, texture and tone b/l.  Toenails 1-5 b/l discolored, thick, dystrophic with subungual debris and pain with palpation to nailbeds due to thickness of nails.  Hyperkeratotic lesion distal tip left 2nd digit, right 3rd digit. No erythema, no edema, no drainage, no flocculence noted.   Musculoskeletal: Muscle strength 5/5 to all LE muscle groups.  HAV with bunion b/l.  Neurological: Sensation intact with 10 gram monofilament.  Vibratory sensation intact.  Assessment: 1. Painful onychomycosis toenails 1-5 b/l 2. Corn left 2nd digit, right 3rd digit 3. 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 S/p renal  transplant  Plan: 1. Continue diabetic foot care principles. Literature dispensed on today. 2. Toenails 1-5 b/l were debrided in length and girth without iatrogenic bleeding. 3. Hyperkeratotic lesion(s) pared left 2nd digit, right 3rd digit with sterile scalpel blade without incident..  4. Patient to continue soft, supportive shoe gear daily. 5. Patient to report any pedal injuries to medical professional immediately. 6. Follow up 3 months.  7. Patient/POA to call should there be a concern in the interim.

## 2019-03-22 ENCOUNTER — Encounter (HOSPITAL_COMMUNITY): Payer: Self-pay

## 2019-03-22 ENCOUNTER — Inpatient Hospital Stay (HOSPITAL_COMMUNITY)
Admission: EM | Admit: 2019-03-22 | Discharge: 2019-03-24 | DRG: 809 | Disposition: A | Discharge status: Home / Self Care | Payer: Medicare Other | Source: Ambulatory Visit | Attending: Internal Medicine | Admitting: Internal Medicine

## 2019-03-22 ENCOUNTER — Other Ambulatory Visit: Payer: Self-pay

## 2019-03-22 DIAGNOSIS — I10 Essential (primary) hypertension: Secondary | ICD-10-CM | POA: Diagnosis present

## 2019-03-22 DIAGNOSIS — D61818 Other pancytopenia: Secondary | ICD-10-CM | POA: Diagnosis not present

## 2019-03-22 DIAGNOSIS — Z91048 Other nonmedicinal substance allergy status: Secondary | ICD-10-CM

## 2019-03-22 DIAGNOSIS — Z940 Kidney transplant status: Secondary | ICD-10-CM

## 2019-03-22 DIAGNOSIS — M1611 Unilateral primary osteoarthritis, right hip: Secondary | ICD-10-CM | POA: Diagnosis present

## 2019-03-22 DIAGNOSIS — Z833 Family history of diabetes mellitus: Secondary | ICD-10-CM

## 2019-03-22 DIAGNOSIS — N400 Enlarged prostate without lower urinary tract symptoms: Secondary | ICD-10-CM | POA: Diagnosis present

## 2019-03-22 DIAGNOSIS — Z885 Allergy status to narcotic agent status: Secondary | ICD-10-CM

## 2019-03-22 DIAGNOSIS — E7800 Pure hypercholesterolemia, unspecified: Secondary | ICD-10-CM | POA: Diagnosis present

## 2019-03-22 DIAGNOSIS — Z794 Long term (current) use of insulin: Secondary | ICD-10-CM

## 2019-03-22 DIAGNOSIS — D649 Anemia, unspecified: Secondary | ICD-10-CM | POA: Diagnosis not present

## 2019-03-22 DIAGNOSIS — Z87891 Personal history of nicotine dependence: Secondary | ICD-10-CM

## 2019-03-22 DIAGNOSIS — E113513 Type 2 diabetes mellitus with proliferative diabetic retinopathy with macular edema, bilateral: Secondary | ICD-10-CM

## 2019-03-22 DIAGNOSIS — Z888 Allergy status to other drugs, medicaments and biological substances status: Secondary | ICD-10-CM

## 2019-03-22 DIAGNOSIS — Z79899 Other long term (current) drug therapy: Secondary | ICD-10-CM

## 2019-03-22 DIAGNOSIS — E113599 Type 2 diabetes mellitus with proliferative diabetic retinopathy without macular edema, unspecified eye: Secondary | ICD-10-CM | POA: Diagnosis present

## 2019-03-22 DIAGNOSIS — Z91040 Latex allergy status: Secondary | ICD-10-CM

## 2019-03-22 DIAGNOSIS — Z961 Presence of intraocular lens: Secondary | ICD-10-CM | POA: Diagnosis present

## 2019-03-22 DIAGNOSIS — Z20828 Contact with and (suspected) exposure to other viral communicable diseases: Secondary | ICD-10-CM | POA: Diagnosis present

## 2019-03-22 DIAGNOSIS — D61811 Other drug-induced pancytopenia: Secondary | ICD-10-CM | POA: Diagnosis not present

## 2019-03-22 DIAGNOSIS — T451X5A Adverse effect of antineoplastic and immunosuppressive drugs, initial encounter: Secondary | ICD-10-CM | POA: Diagnosis present

## 2019-03-22 DIAGNOSIS — Z8249 Family history of ischemic heart disease and other diseases of the circulatory system: Secondary | ICD-10-CM

## 2019-03-22 DIAGNOSIS — Z803 Family history of malignant neoplasm of breast: Secondary | ICD-10-CM

## 2019-03-22 DIAGNOSIS — Z9841 Cataract extraction status, right eye: Secondary | ICD-10-CM

## 2019-03-22 LAB — CBC
HCT: 19.4 % — ABNORMAL LOW (ref 39.0–52.0)
HCT: 19.1 % — ABNORMAL LOW (ref 39.0–52.0)
Hemoglobin: 5.9 g/dL — CL (ref 13.0–17.0)
Hemoglobin: 5.9 g/dL — CL (ref 13.0–17.0)
MCH: 34.9 pg — ABNORMAL HIGH (ref 26.0–34.0)
MCH: 34.7 pg — ABNORMAL HIGH (ref 26.0–34.0)
MCHC: 30.4 g/dL (ref 30.0–36.0)
MCHC: 30.9 g/dL (ref 30.0–36.0)
MCV: 114.8 fL — ABNORMAL HIGH (ref 80.0–100.0)
MCV: 112.4 fL — ABNORMAL HIGH (ref 80.0–100.0)
Platelets: 67 10*3/uL — ABNORMAL LOW (ref 150–400)
Platelets: 68 10*3/uL — ABNORMAL LOW (ref 150–400)
RBC: 1.69 MIL/uL — ABNORMAL LOW (ref 4.22–5.81)
RBC: 1.7 MIL/uL — ABNORMAL LOW (ref 4.22–5.81)
RDW: 20.5 % — ABNORMAL HIGH (ref 11.5–15.5)
RDW: 20.8 % — ABNORMAL HIGH (ref 11.5–15.5)
WBC: 2.7 10*3/uL — ABNORMAL LOW (ref 4.0–10.5)
WBC: 2.8 10*3/uL — ABNORMAL LOW (ref 4.0–10.5)
nRBC: 4 % — ABNORMAL HIGH (ref 0.0–0.2)
nRBC: 4.3 % — ABNORMAL HIGH (ref 0.0–0.2)

## 2019-03-22 LAB — DIFFERENTIAL
Abs Immature Granulocytes: 0.5 10*3/uL — ABNORMAL HIGH (ref 0.00–0.07)
Basophils Absolute: 0 10*3/uL (ref 0.0–0.1)
Basophils Relative: 0 %
Eosinophils Absolute: 0 10*3/uL (ref 0.0–0.5)
Eosinophils Relative: 0 %
Immature Granulocytes: 18 %
Lymphocytes Relative: 39 %
Lymphs Abs: 1.1 10*3/uL (ref 0.7–4.0)
Monocytes Absolute: 0.2 10*3/uL (ref 0.1–1.0)
Monocytes Relative: 7 %
Neutro Abs: 1 10*3/uL — ABNORMAL LOW (ref 1.7–7.7)
Neutrophils Relative %: 36 %

## 2019-03-22 LAB — COMPREHENSIVE METABOLIC PANEL
ALT: 13 U/L (ref 0–44)
AST: 19 U/L (ref 15–41)
Albumin: 4.3 g/dL (ref 3.5–5.0)
Alkaline Phosphatase: 66 U/L (ref 38–126)
Anion gap: 7 (ref 5–15)
BUN: 12 mg/dL (ref 8–23)
CO2: 22 mmol/L (ref 22–32)
Calcium: 10.2 mg/dL (ref 8.9–10.3)
Chloride: 112 mmol/L — ABNORMAL HIGH (ref 98–111)
Creatinine, Ser: 1.49 mg/dL — ABNORMAL HIGH (ref 0.61–1.24)
GFR calc Af Amer: 54 mL/min — ABNORMAL LOW (ref >60)
GFR calc non Af Amer: 47 mL/min — ABNORMAL LOW (ref >60)
Glucose, Bld: 221 mg/dL — ABNORMAL HIGH (ref 70–99)
Potassium: 3.9 mmol/L (ref 3.5–5.1)
Sodium: 141 mmol/L (ref 135–145)
Total Bilirubin: 1.6 mg/dL — ABNORMAL HIGH (ref 0.3–1.2)
Total Protein: 7.1 g/dL (ref 6.5–8.1)

## 2019-03-22 LAB — URINALYSIS, ROUTINE W REFLEX MICROSCOPIC
Bilirubin Urine: NEGATIVE
Glucose, UA: NEGATIVE mg/dL
Hgb urine dipstick: NEGATIVE
Ketones, ur: NEGATIVE mg/dL
Leukocytes,Ua: NEGATIVE
Nitrite: NEGATIVE
Protein, ur: NEGATIVE mg/dL
Specific Gravity, Urine: 1.01 (ref 1.005–1.030)
pH: 6 (ref 5.0–8.0)

## 2019-03-22 LAB — SARS CORONAVIRUS 2 BY RT PCR (HOSPITAL ORDER, PERFORMED IN ~~LOC~~ HOSPITAL LAB): SARS Coronavirus 2: NEGATIVE

## 2019-03-22 LAB — GLUCOSE, CAPILLARY: Glucose-Capillary: 245 mg/dL — ABNORMAL HIGH (ref 70–99)

## 2019-03-22 LAB — PREPARE RBC (CROSSMATCH)

## 2019-03-22 LAB — ABO/RH: ABO/RH(D): A POS

## 2019-03-22 MED ORDER — METOPROLOL TARTRATE 25 MG PO TABS
25.0000 mg | ORAL_TABLET | Freq: Two times a day (BID) | ORAL | Status: DC
  Administered 2019-03-22 – 2019-03-24 (×4): 25 mg via ORAL
  Filled 2019-03-22 (×4): qty 1

## 2019-03-22 MED ORDER — DOXAZOSIN MESYLATE 4 MG PO TABS
4.0000 mg | ORAL_TABLET | Freq: Two times a day (BID) | ORAL | Status: DC
  Administered 2019-03-22 – 2019-03-24 (×4): 4 mg via ORAL
  Filled 2019-03-22 (×5): qty 1

## 2019-03-22 MED ORDER — INSULIN ASPART 100 UNIT/ML ~~LOC~~ SOLN
0.0000 [IU] | Freq: Every day | SUBCUTANEOUS | Status: DC
  Administered 2019-03-22 – 2019-03-23 (×2): 2 [IU] via SUBCUTANEOUS

## 2019-03-22 MED ORDER — LOSARTAN POTASSIUM 50 MG PO TABS
100.0000 mg | ORAL_TABLET | Freq: Every day | ORAL | Status: DC
  Administered 2019-03-23 – 2019-03-24 (×2): 100 mg via ORAL
  Filled 2019-03-22 (×2): qty 2

## 2019-03-22 MED ORDER — ADULT MULTIVITAMIN W/MINERALS CH
1.0000 | ORAL_TABLET | Freq: Every day | ORAL | Status: DC
  Administered 2019-03-22 – 2019-03-24 (×3): 1 via ORAL
  Filled 2019-03-22 (×3): qty 1

## 2019-03-22 MED ORDER — LINAGLIPTIN 5 MG PO TABS
5.0000 mg | ORAL_TABLET | Freq: Every day | ORAL | Status: DC
  Administered 2019-03-23 – 2019-03-24 (×2): 5 mg via ORAL
  Filled 2019-03-22 (×2): qty 1

## 2019-03-22 MED ORDER — ONDANSETRON HCL 4 MG PO TABS: 4.0000 mg | ORAL_TABLET | Freq: Four times a day (QID) | ORAL | Status: DC | PRN

## 2019-03-22 MED ORDER — AMLODIPINE BESYLATE 2.5 MG PO TABS
2.5000 mg | ORAL_TABLET | Freq: Every day | ORAL | Status: DC
  Administered 2019-03-23: 2.5 mg via ORAL
  Filled 2019-03-22: qty 1

## 2019-03-22 MED ORDER — ONDANSETRON HCL 4 MG/2ML IJ SOLN: 4.0000 mg | Freq: Four times a day (QID) | INTRAMUSCULAR | Status: DC | PRN

## 2019-03-22 MED ORDER — TACROLIMUS 1 MG PO CAPS
3.5000 mg | ORAL_CAPSULE | Freq: Two times a day (BID) | ORAL | Status: DC
  Administered 2019-03-22 – 2019-03-24 (×4): 3.5 mg via ORAL
  Filled 2019-03-22 (×5): qty 1

## 2019-03-22 MED ORDER — FUROSEMIDE 40 MG PO TABS: 40.0000 mg | ORAL_TABLET | ORAL | Status: DC

## 2019-03-22 MED ORDER — LATANOPROST 0.005 % OP SOLN
1.0000 [drp] | Freq: Every day | OPHTHALMIC | Status: DC
  Administered 2019-03-22 – 2019-03-23 (×2): 1 [drp] via OPHTHALMIC
  Filled 2019-03-22: qty 2.5

## 2019-03-22 MED ORDER — SODIUM CHLORIDE 0.9 % IV BOLUS: 500.0000 mL | Freq: Once | INTRAVENOUS | Status: DC

## 2019-03-22 MED ORDER — INSULIN GLARGINE 100 UNIT/ML ~~LOC~~ SOLN
34.0000 [IU] | Freq: Every day | SUBCUTANEOUS | Status: DC
  Administered 2019-03-23 – 2019-03-24 (×2): 34 [IU] via SUBCUTANEOUS
  Filled 2019-03-22 (×3): qty 0.34

## 2019-03-22 MED ORDER — INSULIN LISPRO (1 UNIT DIAL) 100 UNIT/ML (KWIKPEN): 8.0000 [IU] | PEN_INJECTOR | SUBCUTANEOUS | Status: DC

## 2019-03-22 MED ORDER — TACROLIMUS 0.5 MG PO CAPS: 0.5000 mg | ORAL_CAPSULE | Freq: Two times a day (BID) | ORAL | Status: DC

## 2019-03-22 MED ORDER — ZOLPIDEM TARTRATE 5 MG PO TABS: 5.0000 mg | ORAL_TABLET | Freq: Every evening | ORAL | Status: DC | PRN

## 2019-03-22 MED ORDER — DONEPEZIL HCL 10 MG PO TABS
10.0000 mg | ORAL_TABLET | Freq: Every day | ORAL | Status: DC
  Administered 2019-03-22 – 2019-03-23 (×2): 10 mg via ORAL
  Filled 2019-03-22 (×2): qty 1

## 2019-03-22 MED ORDER — FUROSEMIDE 40 MG PO TABS
40.0000 mg | ORAL_TABLET | Freq: Every evening | ORAL | Status: DC
  Administered 2019-03-22 – 2019-03-23 (×2): 40 mg via ORAL
  Filled 2019-03-22 (×2): qty 1

## 2019-03-22 MED ORDER — SALINE SPRAY 0.65 % NA SOLN
2.0000 | NASAL | Status: DC | PRN
  Filled 2019-03-22: qty 44

## 2019-03-22 MED ORDER — OXYCODONE HCL 5 MG PO TABS: 5.0000 mg | ORAL_TABLET | ORAL | Status: DC | PRN

## 2019-03-22 MED ORDER — POLYETHYLENE GLYCOL 3350 17 G PO PACK: 17.0000 g | PACK | Freq: Every day | ORAL | Status: DC | PRN

## 2019-03-22 MED ORDER — K PHOS MONO-SOD PHOS DI & MONO 155-852-130 MG PO TABS
500.0000 mg | ORAL_TABLET | Freq: Every day | ORAL | Status: DC
  Administered 2019-03-23 – 2019-03-24 (×2): 500 mg via ORAL
  Filled 2019-03-22 (×2): qty 2

## 2019-03-22 MED ORDER — ENOXAPARIN SODIUM 40 MG/0.4ML ~~LOC~~ SOLN: 40.0000 mg | SUBCUTANEOUS | Status: DC

## 2019-03-22 MED ORDER — K PHOS MONO-SOD PHOS DI & MONO 155-852-130 MG PO TABS
250.0000 mg | ORAL_TABLET | Freq: Every evening | ORAL | Status: DC
  Administered 2019-03-22 – 2019-03-23 (×2): 250 mg via ORAL
  Filled 2019-03-22 (×3): qty 1

## 2019-03-22 MED ORDER — INSULIN ASPART 100 UNIT/ML ~~LOC~~ SOLN
0.0000 [IU] | Freq: Three times a day (TID) | SUBCUTANEOUS | Status: DC
  Administered 2019-03-23 – 2019-03-24 (×2): 3 [IU] via SUBCUTANEOUS

## 2019-03-22 MED ORDER — POTASSIUM CHLORIDE CRYS ER 20 MEQ PO TBCR
20.0000 meq | EXTENDED_RELEASE_TABLET | Freq: Every day | ORAL | Status: DC
  Administered 2019-03-23 – 2019-03-24 (×2): 20 meq via ORAL
  Filled 2019-03-22 (×2): qty 1

## 2019-03-22 MED ORDER — K PHOS MONO-SOD PHOS DI & MONO 155-852-130 MG PO TABS: 2.0000 | ORAL_TABLET | Freq: Every day | ORAL | Status: DC

## 2019-03-22 MED ORDER — ACETAMINOPHEN 325 MG PO TABS: 650.0000 mg | ORAL_TABLET | Freq: Four times a day (QID) | ORAL | Status: DC | PRN

## 2019-03-22 MED ORDER — ONE-A-DAY MENS 50+ ADVANTAGE PO TABS: 1.0000 | ORAL_TABLET | Freq: Every day | ORAL | Status: DC

## 2019-03-22 MED ORDER — FUROSEMIDE 80 MG PO TABS
80.0000 mg | ORAL_TABLET | Freq: Every day | ORAL | Status: DC
  Administered 2019-03-23 – 2019-03-24 (×2): 80 mg via ORAL
  Filled 2019-03-22 (×2): qty 1

## 2019-03-22 MED ORDER — TACROLIMUS 1 MG PO CAPS: 3.0000 mg | ORAL_CAPSULE | Freq: Two times a day (BID) | ORAL | Status: DC

## 2019-03-22 MED ORDER — DORZOLAMIDE HCL 2 % OP SOLN
1.0000 [drp] | Freq: Two times a day (BID) | OPHTHALMIC | Status: DC
  Administered 2019-03-22 – 2019-03-24 (×4): 1 [drp] via OPHTHALMIC
  Filled 2019-03-22: qty 10

## 2019-03-22 MED ORDER — VITAMIN D (ERGOCALCIFEROL) 1.25 MG (50000 UNIT) PO CAPS
50000.0000 [IU] | ORAL_CAPSULE | ORAL | Status: DC
  Filled 2019-03-22: qty 1

## 2019-03-22 MED ORDER — SULFAMETHOXAZOLE-TRIMETHOPRIM 400-80 MG PO TABS
1.0000 | ORAL_TABLET | Freq: Every day | ORAL | Status: DC
  Administered 2019-03-23: 1 via ORAL
  Filled 2019-03-22: qty 1

## 2019-03-22 MED ORDER — SODIUM CHLORIDE 0.9 % IV SOLN: 10.0000 mL/h | Freq: Once | INTRAVENOUS | Status: DC

## 2019-03-22 NOTE — ED Notes (Signed)
ED TO INPATIENT HANDOFF REPORT  ED Nurse Name and Phone #: Caprice Kluver [phone]  S Name/Age/Gender [name] 73 y.o. male Room/Bed: 032C/032C  Code Status   Code Status: Prior  Home/SNF/Other Home Patient oriented to: self, place, time and situation Is this baseline? Yes   Triage Complete: Triage complete  Chief Complaint LOW HBG/SENT BY DR  Triage Note Pt sent by Dr. Vernie Ammons at [facility] for low Hg of 5.7. Hx of kidney disease w/transplant, denies any bleeding, weakness or sob. Sent for blood transfusion   Allergies Allergies  Allergen Reactions  . Codeine Nausea And Vomiting and Nausea Only  . Doxycycline Nausea And Vomiting and Nausea Only  . Latex Hives and Rash  . Tape Other (See Comments)    The "plastic, waffle-printed" tape tears and bruises the skin    Level of Care/Admitting Diagnosis ED Disposition    ED Disposition Condition [place]: [place] [[id number]]  Level of Care: Med-Surg [16]  I expect the patient will be discharged within 24 hours: Yes  LOW acuity---Tx typically complete <24 hrs---ACUTE conditions typically can be evaluated <24 hours---LABS likely to return to acceptable levels <24 hours---IS near functional baseline---EXPECTED to return to current living arrangement---NOT newly hypoxic: Meets criteria for 5C-Observation unit  Covid Evaluation: Screening Protocol (No Symptoms)  Diagnosis: Symptomatic anemia [[id number]]  Admitting Physician: Lady Deutscher [[id number]]  Attending Physician: Lady Deutscher [[id number]]  PT Class (Do Not Modify): Observation [104]  PT Acc Code (Do Not Modify): Observation [10022]       B Medical/Surgery History Past Medical History:  Diagnosis Date  . Anemia    hx of at beginnning of dialysis- 05/2009  . Arthritis    right hip   . End stage renal disease ([place])    [place] [facility]   . End stage renal disease on dialysis ([place])   .  Essential hypertension, malignant   . Hypertrophy of prostate without urinary obstruction and other lower urinary tract symptoms (LUTS)   . Pure hypercholesterolemia   . Renal insufficiency   . Type II or unspecified type diabetes mellitus without mention of complication, not stated as uncontrolled    TYPE 2    Past Surgical History:  Procedure Laterality Date  . A/V FISTULAGRAM Left 06/12/2017   Procedure: A/V Fistulagram;  Surgeon: Algernon Huxley, MD;  Location: [facility];  Service: Cardiovascular;  Laterality: Left;  . CARDIAC CATHETERIZATION  04/29/10   normal coronaries, LVEF 45%, mild global LV hypokinesis ([place])  . CATARACT EXTRACTION W/PHACO Right 04/03/2013   Procedure: CATARACT EXTRACTION PHACO AND INTRAOCULAR LENS PLACEMENT (IOC);  Surgeon: Adonis Brook, MD;  Location: [facility];  Service: Ophthalmology;  Laterality: Right;  . CERVICAL DISCECTOMY  03/1999  . COLONOSCOPY     Hx: of  . CYSTOSCOPY WITH BIOPSY  11/01/2012   Procedure: CYSTOSCOPY WITH BIOPSY;  Surgeon: Molli Hazard, MD;  Location: [facility];  Service: Urology;  Laterality: N/A;  with fulgeration  . CYSTOSCOPY WITH RETROGRADE PYELOGRAM, URETEROSCOPY AND STENT PLACEMENT  11/01/2012   Procedure: CYSTOSCOPY WITH RETROGRADE PYELOGRAM, URETEROSCOPY AND STENT PLACEMENT;  Surgeon: Molli Hazard, MD;  [facility];  Service: Urology;  Laterality: Bilateral;  . DIALYSIS FISTULA CREATION  2010  . EYE SURGERY  2012   cataracts  . EYE SURGERY  11/2014  . HOLMIUM LASER APPLICATION  9/37/1696   Procedure: HOLMIUM LASER APPLICATION;  Surgeon: Dennard Schaumann  Jasmine December, MD;  Location[facility];  Service: Urology;  Laterality: Bilateral;  . KIDNEY TRANSPLANT  2016  . MEMBRANE PEEL Right 09/04/2013   Procedure: MEMBRANE PEEL;  Surgeon: Adonis Brook, MD;  Location: [facility];  Service: Ophthalmology;  Laterality: Right;  . NECK SURGERY  1990  . PARS PLANA VITRECTOMY Right 09/04/2013   Procedure: PARS PLANA  VITRECTOMY WITH 23 GAUGE;  Surgeon: Adonis Brook, MD;  Location: [facility];  Service: Ophthalmology;  Laterality: Right;  . PARS PLANA VITRECTOMY Left 02/17/2015  . PARS PLANA VITRECTOMY Left 02/17/2015   Procedure: PARS PLANA VITRECTOMY WITH 25G REMOVAL/SUTURE INTRAOCULAR LENS;  Surgeon: Hayden Pedro, MD;  Location: [facility];  Service: Ophthalmology;  Laterality: Left;  . PERIPHERAL VASCULAR CATHETERIZATION Left 08/08/2016   Procedure: A/V Shuntogram/Fistulagram;  Surgeon: Algernon Huxley, MD;  Location: [facility];  Service: Cardiovascular;  Laterality: Left;  . PERIPHERAL VASCULAR CATHETERIZATION N/A 08/08/2016   Procedure: A/V Shunt Intervention;  Surgeon: Algernon Huxley, MD;  Location: [facility];  Service: Cardiovascular;  Laterality: N/A;  . PHOTOCOAGULATION WITH LASER Right 09/04/2013   Procedure: PHOTOCOAGULATION WITH LASER;  Surgeon: Adonis Brook, MD;  Location: [facility];  Service: Ophthalmology;  Laterality: Right;  ENDOLASER  . TRANSTHORACIC ECHOCARDIOGRAM  06/19/2012   normal LV sys function, EF 55-60%, mild to mod diastolic dysfunction, mild AI ([place])     A IV Location/Drains/Wounds Patient Lines/Drains/Airways Status   Active Line/Drains/Airways    Name:   Placement date:   Placement time:   Site:   Days:   Peripheral IV 09/04/13 Right Hand   09/04/13    0800    Hand   2025   Peripheral IV 03/22/19 Left Hand   03/22/19    1435    Hand   less than 1   Vascular Access Right Upper arm Arteriovenous fistula   -    -    Upper arm      Fistula / Graft Left Upper arm Arteriovenous fistula   -    -    Upper arm      Fistula / Graft Right Upper arm Arteriovenous fistula   -    -    Upper arm      Fistula / Graft Left Upper arm Arteriovenous fistula   -    -    Upper arm      Post Cath / Sheath 08/08/16 Left   08/08/16    1502    -   956   Ureteral Drain/Stent Left ureter 6 Fr.   11/01/12    1400    Left ureter   2332   Ureteral Drain/Stent Right ureter 6 Fr.    11/01/12    1406    Right ureter   2332   Incision 04/03/13 Eye Right   04/03/13    1814     2179   Incision 09/04/13 Eye Right   09/04/13    0903     2025   Incision (Closed) 02/17/15 Eye Left   02/17/15    1414     1494          Intake/Output Last 24 hours No intake or output data in the 24 hours ending 03/22/19 1656  Labs/Imaging Results for orders placed or performed during the hospital encounter of 03/22/19 (from the past 48 hour(s))  Type and screen [place]     Status: None (Preliminary result)   Collection Time: 03/22/19  1:35 PM  Result Value Ref Range   ABO/RH(D) A POS    Antibody Screen NEG    Sample Expiration 03/25/2019,2359    Unit Number [id number]    Blood Component Type RBC, LR IRR    Unit division 00    Status of Unit ISSUED    Transfusion Status OK TO TRANSFUSE    Crossmatch Result      Compatible Performed at [facility], [place] [address] [place], [place] [address]    Unit Number [id number]    Blood Component Type RBC, LR IRR    Unit division 00    Status of Unit ALLOCATED    Transfusion Status OK TO TRANSFUSE    Crossmatch Result Compatible   Prepare RBC     Status: None   Collection Time: 03/22/19  1:35 PM  Result Value Ref Range   Order Confirmation      ORDER PROCESSED BY BLOOD BANK Performed at [facility], [place] [address], [place], [place] [address]   ABO/Rh     Status: None   Collection Time: 03/22/19  1:35 PM  Result Value Ref Range   ABO/RH(D)      A POS Performed at [facility] [address], [place], [place] [address]   Comprehensive metabolic panel     Status: Abnormal   Collection Time: 03/22/19  1:39 PM  Result Value Ref Range   Sodium 141 135 - 145 mmol/L   Potassium 3.9 3.5 - 5.1 mmol/L   Chloride 112 (H) 98 - 111 mmol/L   CO2 22 22 - 32 mmol/L   Glucose, Bld 221 (H) 70 - 99 mg/dL   BUN 12 8 - 23 mg/dL   Creatinine, Ser 1.49 (H) 0.61 - 1.24 mg/dL   Calcium 10.2 8.9 - 10.3  mg/dL   Total Protein 7.1 6.5 - 8.1 g/dL   Albumin 4.3 3.5 - 5.0 g/dL   AST 19 15 - 41 U/L   ALT 13 0 - 44 U/L   Alkaline Phosphatase 66 38 - 126 U/L   Total Bilirubin 1.6 (H) 0.3 - 1.2 mg/dL   GFR calc non Af Amer 47 (L) >60 mL/min   GFR calc Af Amer 54 (L) >60 mL/min   Anion gap 7 5 - 15    Comment: Performed at [facility] [address] [place] [address]  CBC     Status: Abnormal   Collection Time: 03/22/19  1:39 PM  Result Value Ref Range   WBC 2.7 (L) 4.0 - 10.5 K/uL   RBC 1.69 (L) 4.22 - 5.81 MIL/uL   Hemoglobin 5.9 (LL) 13.0 - 17.0 g/dL    Comment: REPEATED TO VERIFY THIS CRITICAL RESULT HAS VERIFIED AND BEEN CALLED TO J BLUE RN BY ALLISON BENNETT ON 06 05 2020 AT 1408, AND HAS BEEN READ BACK.     HCT 19.4 (L) 39.0 - 52.0 %   MCV 114.8 (H) 80.0 - 100.0 fL   MCH 34.9 (H) 26.0 - 34.0 pg   MCHC 30.4 30.0 - 36.0 g/dL   RDW 20.5 (H) 11.5 - 15.5 %   Platelets 67 (L) 150 - 400 K/uL    Comment: REPEATED TO VERIFY PLATELET COUNT CONFIRMED BY SMEAR Immature Platelet Fraction may be clinically indicated, consider ordering this additional test [id number]    nRBC 4.0 (H) 0.0 - 0.2 %    Comment: Performed at [facility] [address], [place], [place]  [address]  Differential     Status: Abnormal   Collection Time: 03/22/19  3:00 PM  Result Value Ref Range   Neutrophils Relative % 36 %   Neutro Abs 1.0 (L) 1.7 - 7.7 K/uL   Lymphocytes Relative 39 %   Lymphs Abs 1.1 0.7 - 4.0 K/uL   Monocytes Relative 7 %   Monocytes Absolute 0.2 0.1 - 1.0 K/uL   Eosinophils Relative 0 %   Eosinophils Absolute 0.0 0.0 - 0.5 K/uL   Basophils Relative 0 %   Basophils Absolute 0.0 0.0 - 0.1 K/uL   Immature Granulocytes 18 %   Abs Immature Granulocytes 0.50 (H) 0.00 - 0.07 K/uL    Comment: Performed at [facility] [address], [place], [place] [address]  CBC     Status: Abnormal   Collection Time: 03/22/19  3:00 PM  Result Value Ref Range   WBC 2.8 (L) 4.0  - 10.5 K/uL    Comment: REPEATED TO VERIFY WHITE COUNT CONFIRMED ON SMEAR    RBC 1.70 (L) 4.22 - 5.81 MIL/uL   Hemoglobin 5.9 (LL) 13.0 - 17.0 g/dL    Comment: REPEATED TO VERIFY THIS CRITICAL RESULT HAS VERIFIED AND BEEN CALLED TO S Jessey Stehlin RN BY KIRSTENE FORSYTH ON 06 05 2020 AT 59, AND HAS BEEN READ BACK.     HCT 19.1 (L) 39.0 - 52.0 %   MCV 112.4 (H) 80.0 - 100.0 fL   MCH 34.7 (H) 26.0 - 34.0 pg   MCHC 30.9 30.0 - 36.0 g/dL   RDW 20.8 (H) 11.5 - 15.5 %   Platelets 68 (L) 150 - 400 K/uL    Comment: REPEATED TO VERIFY PLATELET COUNT CONFIRMED BY SMEAR SPECIMEN CHECKED FOR CLOTS Immature Platelet Fraction may be clinically indicated, consider ordering this additional test [id number]    nRBC 4.3 (H) 0.0 - 0.2 %    Comment: Performed at [facility] [address] [place], [place] [address]  Urinalysis, Routine w reflex microscopic     Status: None   Collection Time: 03/22/19  4:09 PM  Result Value Ref Range   Color, Urine YELLOW YELLOW   APPearance CLEAR CLEAR   Specific Gravity, Urine 1.010 1.005 - 1.030   pH 6.0 5.0 - 8.0   Glucose, UA NEGATIVE NEGATIVE mg/dL   Hgb urine dipstick NEGATIVE NEGATIVE   Bilirubin Urine NEGATIVE NEGATIVE   Ketones, ur NEGATIVE NEGATIVE mg/dL   Protein, ur NEGATIVE NEGATIVE mg/dL   Nitrite NEGATIVE NEGATIVE   Leukocytes,Ua NEGATIVE NEGATIVE    Comment: Performed at [facility] [address] [place], [place] [address]   No results found.  Pending Labs Unresulted Labs (From admission, onward)    Start     Ordered   03/22/19 1541  SARS Coronavirus 2 (CEPHEID - Performed in [facility]), [facility] Order  Once,   R    Question:  Rule Out  Answer:  Yes   03/22/19 1541          Vitals/Pain Today's Vitals   03/22/19 1336 03/22/19 1515 03/22/19 1557 03/22/19 1621  BP:  (!) 155/65 (!) 163/48 (!) 176/83  Pulse:  64 68 73  Resp:  12 13 18   Temp:   98.2 F (36.8 C) 97.8 F (36.6 C)  TempSrc:      SpO2:  98% 100%  99%  PainSc: 0-No pain       Isolation Precautions No active isolations  Medications Medications  0.9 %  sodium chloride infusion (has no administration in time range)  sodium chloride 0.9 % bolus 500 mL (has no administration in time range)    Mobility walks Low fall risk   Focused Assessments    R Recommendations: See Admitting Provider Note  Report given to:   Additional Notes:

## 2019-03-22 NOTE — H&P (Signed)
History and Physical    [name] CZY:[id number] DOB: 1945-10-26 DOA: 03/22/2019  PCP: Rogers Blocker, MD  Patient coming from: Home referred by Dr. Grayland Ormond  I have personally briefly reviewed patient's old medical records in [place]  Chief Complaint: Anemia needs blood transfusion  HPI: [name] is a 73 y.o. male with medical history significant of renal failure status post kidney transplant 2016 with no hospitalizations since then 2 diabetes, hypercholesterolemia, prostate hypertrophy, right hip arthritis presents at the request of Dr. Raquel Sarna after blood work showed a hemoglobin to be 5.9.  Went to see him and was complaining of 2 to 3 weeks of progressively worsening fatigue and dyspnea on exertion.  He has had no headache vision changes cough or fever.  No nausea or vomiting, no chest pain or shortness of breath no abdominal pain no dysuria no black stools no obvious sources of bleeding and is currently without complaints other than fatigue.  He has had no other exacerbating or alleviating factors.  ED Course: Hemoglobin found to be 5.9 but patient also with pancytopenia white blood cell count of 2.7 and a platelet count of approximately 67.  Review of Systems: As per HPI otherwise all other systems reviewed and  negative.   Past Medical History:  Diagnosis Date  . Anemia    hx of at beginnning of dialysis- 05/2009  . Arthritis    right hip   . End stage renal disease ([place])    [place] [facility]   . End stage renal disease on dialysis ([place])   . Essential hypertension, malignant   . Hypertrophy of prostate without urinary obstruction and other lower urinary tract symptoms (LUTS)   . Pure hypercholesterolemia   . Renal insufficiency   . Type II or unspecified type diabetes mellitus without mention of complication, not stated as uncontrolled    TYPE 2     Past Surgical History:  Procedure Laterality Date  . A/V FISTULAGRAM  Left 06/12/2017   Procedure: A/V Fistulagram;  Surgeon: Algernon Huxley, MD;  Location: [facility] [facility];  Service: Cardiovascular;  Laterality: Left;  . CARDIAC CATHETERIZATION  04/29/10   normal coronaries, LVEF 45%, mild global LV hypokinesis ([place])  . CATARACT EXTRACTION W/PHACO Right 04/03/2013   Procedure: CATARACT EXTRACTION PHACO AND INTRAOCULAR LENS PLACEMENT (IOC);  Surgeon: Adonis Brook, MD;  Location: [facility];  Service: Ophthalmology;  Laterality: Right;  . CERVICAL DISCECTOMY  03/1999  . COLONOSCOPY     Hx: of  . CYSTOSCOPY WITH BIOPSY  11/01/2012   Procedure: CYSTOSCOPY WITH BIOPSY;  Surgeon: Molli Hazard, MD;  Location: [facility];  Service: Urology;  Laterality: N/A;  with fulgeration  . CYSTOSCOPY WITH RETROGRADE PYELOGRAM, URETEROSCOPY AND STENT PLACEMENT  11/01/2012   Procedure: CYSTOSCOPY WITH RETROGRADE PYELOGRAM, URETEROSCOPY AND STENT PLACEMENT;  Surgeon: Molli Hazard, MD;  Location: [facility];  Service: Urology;  Laterality: Bilateral;  . DIALYSIS FISTULA CREATION  2010  . EYE SURGERY  2012   cataracts  . EYE SURGERY  11/2014  . HOLMIUM LASER APPLICATION  0/93/2355   Procedure: HOLMIUM LASER APPLICATION;  Surgeon: Molli Hazard, MD;  Location: [facility];  Service: Urology;  Laterality: Bilateral;  . KIDNEY TRANSPLANT  2016  . MEMBRANE PEEL Right 09/04/2013   Procedure: MEMBRANE PEEL;  Surgeon: Adonis Brook, MD;  Location: [facility];  Service: Ophthalmology;  Laterality: Right;  . NECK SURGERY  1990  . PARS PLANA VITRECTOMY  Right 09/04/2013   Procedure: PARS PLANA VITRECTOMY WITH 23 GAUGE;  Surgeon: Adonis Brook, MD;  Location: [place];  Service: Ophthalmology;  Laterality: Right;  . PARS PLANA VITRECTOMY Left 02/17/2015  . PARS PLANA VITRECTOMY Left 02/17/2015   Procedure: PARS PLANA VITRECTOMY WITH 25G REMOVAL/SUTURE INTRAOCULAR LENS;  Surgeon: Hayden Pedro, MD;  Location: [facility];  Service: Ophthalmology;  Laterality: Left;  . PERIPHERAL VASCULAR  CATHETERIZATION Left 08/08/2016   Procedure: A/V Shuntogram/Fistulagram;  Surgeon: Algernon Huxley, MD;  Location: [facility];  Service: Cardiovascular;  Laterality: Left;  . PERIPHERAL VASCULAR CATHETERIZATION N/A 08/08/2016   Procedure: A/V Shunt Intervention;  Surgeon: Algernon Huxley, MD;  Location: [facility];  Service: Cardiovascular;  Laterality: N/A;  . PHOTOCOAGULATION WITH LASER Right 09/04/2013   Procedure: PHOTOCOAGULATION WITH LASER;  Surgeon: Adonis Brook, MD;  Location: [facility];  Service: Ophthalmology;  Laterality: Right;  ENDOLASER  . TRANSTHORACIC ECHOCARDIOGRAM  06/19/2012   normal LV sys function, EF 55-60%, mild to mod diastolic dysfunction, mild AI ([place])    Social History   Social History Narrative  . Not on file     reports that he quit smoking about 28 years ago. His smoking use included cigarettes. He has never used smokeless tobacco. He reports current alcohol use. He reports that he does not use drugs.  Allergies  Allergen Reactions  . Codeine Nausea And Vomiting and Nausea Only  . Doxycycline Nausea And Vomiting and Nausea Only  . Latex Hives and Rash  . Tape Other (See Comments)    The "plastic, waffle-printed" tape tears and bruises the skin    Family History  Problem Relation Age of Onset  . Breast cancer Mother 37       deceased  . Pneumonia Father 55       deceased  . Hypertension Brother   . Diabetes Mellitus II Brother     Prior to Admission medications   Medication Sig Start Date End Date Taking? Authorizing Provider  amLODipine (NORVASC) 2.5 MG tablet Take 2.5 mg by mouth daily. 12/30/18  Yes [provider]  donepezil (ARICEPT) 10 MG tablet Take 10 mg by mouth at bedtime.   Yes [provider]  dorzolamide (TRUSOPT) 2 % ophthalmic solution Place 1 drop into both eyes 2 (two) times a day.  12/06/18  Yes [provider]  doxazosin (CARDURA) 4 MG tablet Take 4 mg by mouth 2 (two) times  daily. 02/28/19  Yes [provider]  furosemide (LASIX) 40 MG tablet Take 40-80 mg by mouth See admin instructions. Take 80 mg by mouth in the morning and 40 mg in the evening   Yes [provider]  HUMALOG KWIKPEN 100 UNIT/ML KiwkPen Inject 8-16 Units into the skin See admin instructions. Inject 8-16 units into the skin three times a day before meals, PER SLIDING SCALE (BGL 150 or less, use nothing) 04/29/18  Yes [provider]  Insulin Glargine (LANTUS SOLOSTAR) 100 UNIT/ML Solostar Pen Inject 34 Units into the skin daily before breakfast.  05/19/16  Yes [provider]  K Phos Mono-Sod Phos Di & Mono (PHOSPHA 250 NEUTRAL) [phone] MG TABS Take 1-2 tablets by mouth See admin instructions. Take 2 tablets by mouth in the morning and 1 tablet at bedtime at 8 PM 02/01/19  Yes [provider]  KLOR-CON M20 20 MEQ tablet Take 20 mEq by mouth daily. 05/10/18  Yes [provider]  latanoprost (XALATAN) 0.005 % ophthalmic solution  Place 1 drop into both eyes at bedtime.    Yes [provider]  linagliptin (TRADJENTA) 5 MG TABS tablet Take 5 mg by mouth daily with breakfast.    Yes [provider]  losartan (COZAAR) 100 MG tablet Take 100 mg by mouth daily.   Yes [provider]  metoprolol tartrate (LOPRESSOR) 25 MG tablet Take 25 mg by mouth 2 (two) times daily.   Yes [provider]  mycophenolate (MYFORTIC) 360 MG TBEC EC tablet Take 360 mg by mouth every morning.  06/19/18  Yes [provider]  sodium chloride (OCEAN) 0.65 % SOLN nasal spray Place 2 sprays into both nostrils as needed for congestion.    Yes [provider]  sulfamethoxazole-trimethoprim (BACTRIM) 400-80 MG tablet Take 1 tablet by mouth daily. 02/21/19  Yes [provider]  tacrolimus (PROGRAF) 0.5 MG capsule Take 0.5 mg by mouth 2 (two) times daily. 02/28/19  Yes [provider]  tacrolimus (PROGRAF) 1 MG capsule Take 3  mg by mouth 2 (two) times daily.  06/04/18  Yes [provider]  tadalafil (CIALIS) 20 MG tablet Take 20 mg by mouth daily as needed for erectile dysfunction.    Yes [provider]  acetaminophen (TYLENOL) 325 MG tablet Take 650 mg by mouth every 6 (six) hours as needed (pain).     [provider]  allopurinol (ZYLOPRIM) 300 MG tablet Take 300 mg by mouth daily with breakfast.     [provider]  brimonidine (ALPHAGAN) 0.2 % ophthalmic solution Place 1 drop into the left eye 2 (two) times daily. Patient not taking: Reported on 03/22/2019 02/18/15   Hayden Pedro, MD  cinacalcet (SENSIPAR) 90 MG tablet Take 90 mg by mouth daily with supper.     [provider]  famotidine (PEPCID) 20 MG tablet Take 20 mg by mouth 2 (two) times daily.    [provider]  gatifloxacin (ZYMAXID) 0.5 % SOLN Place 1 drop into the left eye 4 (four) times daily. Patient not taking: Reported on 03/22/2019 02/18/15   Hayden Pedro, MD  glimepiride (AMARYL) 4 MG tablet Take 4 mg by mouth daily with breakfast.     [provider]  HYDROcodone-acetaminophen (NORCO/VICODIN) 5-325 MG per tablet Take 1-2 tablets by mouth every 4 (four) hours as needed for moderate pain. 02/18/15   Hayden Pedro, MD  ketoconazole (NIZORAL) 2 % shampoo Apply 1 application topically daily.  04/16/18   [provider]  prednisoLONE acetate (PRED FORTE) 1 % ophthalmic suspension Place 1 drop into the left eye 4 (four) times daily. Patient not taking: Reported on 03/22/2019 02/18/15   Hayden Pedro, MD  sucroferric oxyhydroxide (VELPHORO) 500 MG chewable tablet Chew 500 mg by mouth 3 (three) times daily with meals.    [provider]  timolol (TIMOPTIC) 0.5 % ophthalmic solution Place 1 drop into the right eye 2 (two) times daily. Patient not taking: Reported on 03/22/2019 02/18/15   Hayden Pedro, MD  Vitamin D, Ergocalciferol, (DRISDOL) 50000 units CAPS capsule Take by mouth.  03/21/16   [provider]    Physical Exam:  Constitutional: NAD, calm, comfortable Vitals:   03/22/19 1332 03/22/19 1515 03/22/19 1557 03/22/19 1621  BP: (!) 143/67 (!) 155/65 (!) 163/48 (!) 176/83  Pulse: 68 64 68 73  Resp: 20 12 13 18   Temp: 98.8 F (37.1 C)  98.2 F (36.8 C) 97.8 F (36.6 C)  TempSrc: Oral  SpO2: 100% 98% 100% 99%   Eyes: PERRL, lids  Normal, conjunctiva are pale ENMT: Mucous membranes are moist. Posterior pharynx clear of any exudate or lesions.Normal dentition.  Neck: normal, supple, no masses, no thyromegaly Respiratory: clear to auscultation bilaterally, no wheezing, no crackles. Normal respiratory effort. No accessory muscle use.  Cardiovascular: Regular rate and rhythm, no murmurs / rubs / gallops. No extremity edema. 2+ pedal pulses. No carotid bruits.  Abdomen: no tenderness, no masses palpated. No hepatosplenomegaly. Bowel sounds positive.  Musculoskeletal: no clubbing / cyanosis. No joint deformity upper and lower extremities. Good ROM, no contractures. Normal muscle tone.  Skin: no rashes, lesions, ulcers. No induration Neurologic: CN 2-12 grossly intact. Sensation intact, DTR normal. Strength 5/5 in all 4.  Psychiatric: Normal judgment and insight. Alert and oriented x 3. Normal mood.    Labs on Admission: I have personally reviewed following labs and imaging studies  CBC: Recent Labs  Lab 03/22/19 1339 03/22/19 1500  WBC 2.7* 2.8*  NEUTROABS  --  1.0*  HGB 5.9* 5.9*  HCT 19.4* 19.1*  MCV 114.8* 112.4*  PLT 67* 68*   Basic Metabolic Panel: Recent Labs  Lab 03/22/19 1339  NA 141  K 3.9  CL 112*  CO2 22  GLUCOSE 221*  BUN 12  CREATININE 1.49*  CALCIUM 10.2   GFR: CrCl cannot be calculated (Unknown ideal weight.). Liver Function Tests: Recent Labs  Lab 03/22/19 1339  AST 19  ALT 13  ALKPHOS 66  BILITOT 1.6*  PROT 7.1  ALBUMIN 4.3   Urine analysis:    Component Value Date/Time   COLORURINE YELLOW  03/22/2019 1609   APPEARANCEUR CLEAR 03/22/2019 1609   LABSPEC 1.010 03/22/2019 1609   PHURINE 6.0 03/22/2019 1609   GLUCOSEU NEGATIVE 03/22/2019 1609   HGBUR NEGATIVE 03/22/2019 [place] 03/22/2019 [place] 03/22/2019 1609   PROTEINUR NEGATIVE 03/22/2019 1609   UROBILINOGEN 0.2 08/17/2012 1120   NITRITE NEGATIVE 03/22/2019 1609   LEUKOCYTESUR NEGATIVE 03/22/2019 1609    Radiological Exams on Admission: No results found.  EKG: Independently reviewed.  Rhythm with normal axes and intervals  Assessment/Plan Principal Problem:   Symptomatic anemia Active Problems:   Pancytopenia (HCC)   History of kidney transplant   HTN (hypertension)   Diabetes mellitus ([place])   Proliferative diabetic retinopathy ([place])    1.  Symptomatic anemia: Patient to be placed in observation and be transfused 2 units of packed red blood cells.  Will repeat CBC in a.m.  2.  Pancytopenia: Likely related to Myfortic.  I discussed the case with Dr. Moshe Cipro who suggested holding Myfortic overnight she will see the patient in a.m. and consider restarting at a lower dose.  This is frequently the case in patients who develop pancytopenia on this medication.  If it turns out that it is not a medication then patient may benefit from referral to a hematologist.  3.  History of kidney transplant: Noted we will continue rejection medications as able except for holding Myfortic.  She has done amazingly well and has not been rehospitalized since his kidney transplant.  He is meticulous in his care.  4.  Hypertension: Pressure stable continue current management.  5.  Diabetes mellitus continue home medication regimen and start diabetic sliding scale protocol.  6.  Peripheral diabetic retinopathy: Continue eyedrops as per home regimen.     DVT prophylaxis: Lovenox Code Status: Full code Family Communication: Spoke with patient's wife [name] at her home  number area  code [phone] updated her on patient's condition and likely discharge in a.m. Disposition Plan: Likely home in a.m. Consults called: Dr. Moshe Cipro from nephrology who will see in a.m. Admission status: Ovation  It is my clinical opinion that referral for OBSERVATION is reasonable and necessary in this patient based on the above information provided. The aforementioned taken together are felt to place the patient at high risk for further clinical deterioration. However it is anticipated that the patient may be medically stable for discharge from the hospital within 24 to 48 hours.  Lady Deutscher MD FACP [facility] Pager [phone]  How to contact the [facility] Attending or Consulting provider [place] or covering provider during after hours [place], for this patient?  1. Check the care team in [facility] and look for a) attending/consulting [facility] provider listed and b) the [facility] team listed 2. Log into [email or web address] and use [place]'s universal password to access. If you do not have the password, please contact the hospital operator. 3. Locate the [facility] provider you are looking for under [facility] and page to a number that you can be directly reached. 4. If you still have difficulty reaching the provider, please page the [facility] (Director on Call) for the Hospitalists listed on amion for assistance.  If 7PM-7AM, please contact night-coverage [email or web address] Password [facility]  03/22/2019, 4:49 PM

## 2019-03-22 NOTE — ED Notes (Signed)
Report called, Nurse has to transport.

## 2019-03-22 NOTE — ED Notes (Signed)
Hgb of 5.9    Called to [employer]

## 2019-03-22 NOTE — Progress Notes (Addendum)
Pharmacy Consult  Pharmacy consulted to evaluate for PTA medications potentially associated with pancytopenia. MD notes already holding patient's PTA Myfortic for this reason (most likely to cause pancytopenia from patient's home regimen).  Frequency >10%: Tacrolimus  Frequency <1%, post-marketing, and/or case reports: Norvasc Doxazosin Losartan Bactrim  Frequency not defined but listed as potential adverse effect: Hydrocodone/APAP APAP Lasix  Elicia Lamp, PharmD, BCPS Please check AMION for all [place] contact numbers Clinical Pharmacist 03/22/2019 5:22 PM

## 2019-03-22 NOTE — ED Provider Notes (Signed)
[facility] Emergency Department Provider Note MRN:  [id number]  Arrival date & time: 03/22/19     Chief Complaint   Anemia   History of Present Illness   [name] is a 73 y.o. year-old male with a history of kidney failure status post kidney transplant (2016) presenting to the ED with chief complaint of anemia.  2 to 3 weeks of progressively worsening fatigue and dyspnea on exertion.  Denies headache or vision change, no cough or fever, no nausea or vomiting, no chest pain or shortness of breath, no abdominal pain, no dysuria, no black stools, no obvious sources of bleeding, currently without complaints other than fatigue.  No other exacerbating or alleviating factors  Review of Systems  A complete 10 system review of systems was obtained and all systems are negative except as noted in the HPI and PMH.   Patient's Health History    Past Medical History:  Diagnosis Date  . Anemia    hx of at beginnning of dialysis- 05/2009  . Arthritis    right hip   . End stage renal disease ([place])    [place]   . End stage renal disease on dialysis ([place])   . Essential hypertension, malignant   . Hypertrophy of prostate without urinary obstruction and other lower urinary tract symptoms (LUTS)   . Pure hypercholesterolemia   . Renal insufficiency   . Type II or unspecified type diabetes mellitus without mention of complication, not stated as uncontrolled    TYPE 2     Past Surgical History:  Procedure Laterality Date  . A/V FISTULAGRAM Left 06/12/2017   Procedure: A/V Fistulagram;  Surgeon: Algernon Huxley, MD;  Location: [place] [facility];  Service: Cardiovascular;  Laterality: Left;  . CARDIAC CATHETERIZATION  04/29/10   normal coronaries, LVEF 45%, mild global LV hypokinesis ([place])  . CATARACT EXTRACTION W/PHACO Right 04/03/2013   Procedure: CATARACT EXTRACTION PHACO AND INTRAOCULAR LENS PLACEMENT (IOC);  Surgeon:  Adonis Brook, MD;  Location: [place];  Service: Ophthalmology;  Laterality: Right;  . CERVICAL DISCECTOMY  03/1999  . COLONOSCOPY     Hx: of  . CYSTOSCOPY WITH BIOPSY  11/01/2012   Procedure: CYSTOSCOPY WITH BIOPSY;  Surgeon: Molli Hazard, MD;  Location: [facility];  Service: Urology;  Laterality: N/A;  with fulgeration  . CYSTOSCOPY WITH RETROGRADE PYELOGRAM, URETEROSCOPY AND STENT PLACEMENT  11/01/2012   Procedure: CYSTOSCOPY WITH RETROGRADE PYELOGRAM, URETEROSCOPY AND STENT PLACEMENT;  Surgeon: Molli Hazard, MD;  Location: [facility];  Service: Urology;  Laterality: Bilateral;  . DIALYSIS FISTULA CREATION  2010  . EYE SURGERY  2012   cataracts  . EYE SURGERY  11/2014  . HOLMIUM LASER APPLICATION  0/06/3817   Procedure: HOLMIUM LASER APPLICATION;  Surgeon: Molli Hazard, MD;  Location: [facility];  Service: Urology;  Laterality: Bilateral;  . KIDNEY TRANSPLANT  2016  . MEMBRANE PEEL Right 09/04/2013   Procedure: MEMBRANE PEEL;  Surgeon: Adonis Brook, MD;  Location: [place];  Service: Ophthalmology;  Laterality: Right;  . NECK SURGERY  1990  . PARS PLANA VITRECTOMY Right 09/04/2013   Procedure: PARS PLANA VITRECTOMY WITH 23 GAUGE;  Surgeon: Adonis Brook, MD;  Location: [place];  Service: Ophthalmology;  Laterality: Right;  . PARS PLANA VITRECTOMY Left 02/17/2015  . PARS PLANA VITRECTOMY Left 02/17/2015   Procedure: PARS PLANA VITRECTOMY WITH 25G REMOVAL/SUTURE INTRAOCULAR LENS;  Surgeon: Hayden Pedro, MD;  Location: [place];  Service: Ophthalmology;  Laterality: Left;  . PERIPHERAL VASCULAR CATHETERIZATION Left 08/08/2016   Procedure: A/V Shuntogram/Fistulagram;  Surgeon: Algernon Huxley, MD;  Location: [place] [facility];  Service: Cardiovascular;  Laterality: Left;  . PERIPHERAL VASCULAR CATHETERIZATION N/A 08/08/2016   Procedure: A/V Shunt Intervention;  Surgeon: Algernon Huxley, MD;  Location: [place] [facility];  Service: Cardiovascular;  Laterality: N/A;  . PHOTOCOAGULATION WITH  LASER Right 09/04/2013   Procedure: PHOTOCOAGULATION WITH LASER;  Surgeon: Adonis Brook, MD;  Location: [place];  Service: Ophthalmology;  Laterality: Right;  ENDOLASER  . TRANSTHORACIC ECHOCARDIOGRAM  06/19/2012   normal LV sys function, EF 55-60%, mild to mod diastolic dysfunction, mild AI ([place])    Family History  Problem Relation Age of Onset  . Breast cancer Mother 75       deceased  . Pneumonia Father 30       deceased  . Hypertension Brother   . Diabetes Mellitus II Brother     Social History   Socioeconomic History  . Marital status: Married    Spouse name: Not on file  . Number of children: Not on file  . Years of education: Not on file  . Highest education level: Not on file  Occupational History  . Not on file  Social Needs  . Financial resource strain: Not on file  . Food insecurity:    Worry: Not on file    Inability: Not on file  . Transportation needs:    Medical: Not on file    Non-medical: Not on file  Tobacco Use  . Smoking status: Former Smoker    Types: Cigarettes    Last attempt to quit: 10/02/1990    Years since quitting: 28.4  . Smokeless tobacco: Never Used  Substance and Sexual Activity  . Alcohol use: Yes    Comment: occasional  . Drug use: No  . Sexual activity: Not on file  Lifestyle  . Physical activity:    Days per week: Not on file    Minutes per session: Not on file  . Stress: Not on file  Relationships  . Social connections:    Talks on phone: Not on file    Gets together: Not on file    Attends religious service: Not on file    Active member of club or organization: Not on file    Attends meetings of clubs or organizations: Not on file    Relationship status: Not on file  . Intimate partner violence:    Fear of current or ex partner: Not on file    Emotionally abused: Not on file    Physically abused: Not on file    Forced sexual activity: Not on file  Other Topics Concern  . Not on file  Social History  Narrative  . Not on file     Physical Exam  Vital Signs and Nursing Notes reviewed Vitals:   03/22/19 1515 03/22/19 1557  BP: (!) 155/65 (!) 163/48  Pulse: 64 68  Resp: 12 13  Temp:  98.2 F (36.8 C)  SpO2: 98% 100%    CONSTITUTIONAL: Well-appearing, NAD NEURO:  Alert and oriented x 3, no focal deficits EYES:  eyes equal and reactive ENT/NECK:  no LAD, no JVD CARDIO: Regular rate, well-perfused, normal S1 and S2 PULM:  CTAB no wheezing or rhonchi GI/GU:  normal bowel sounds, non-distended, non-tender MSK/SPINE:  No gross deformities, no edema SKIN:  no rash, atraumatic PSYCH:  Appropriate speech and behavior  Diagnostic  and Interventional Summary    EKG Interpretation  Date/Time: 22-March-2019 at 15: 28: 17   Ventricular Rate:  64 bpm PR Interval:   QRS Duration: 98 MS QT Interval:  413 MS QTC Calculation: 427 MS R Axis:   49     37     33 Text Interpretation: Sinus rhythm Confirmed by Gerlene Fee, MD at 4:02 PM.     Labs Reviewed  COMPREHENSIVE METABOLIC PANEL - Abnormal; Notable for the following components:      Result Value   Chloride 112 (*)    Glucose, Bld 221 (*)    Creatinine, Ser 1.49 (*)    Total Bilirubin 1.6 (*)    GFR calc non Af Amer 47 (*)    GFR calc Af Amer 54 (*)    All other components within normal limits  CBC - Abnormal; Notable for the following components:   WBC 2.7 (*)    RBC 1.69 (*)    Hemoglobin 5.9 (*)    HCT 19.4 (*)    MCV 114.8 (*)    MCH 34.9 (*)    RDW 20.5 (*)    Platelets 67 (*)    nRBC 4.0 (*)    All other components within normal limits  DIFFERENTIAL - Abnormal; Notable for the following components:   Neutro Abs 1.0 (*)    Abs Immature Granulocytes 0.50 (*)    All other components within normal limits  CBC - Abnormal; Notable for the following components:   WBC 2.8 (*)    RBC 1.70 (*)    Hemoglobin 5.9 (*)    HCT 19.1 (*)    MCV 112.4 (*)    MCH 34.7 (*)    RDW 20.8 (*)    Platelets 68 (*)    nRBC 4.3 (*)     All other components within normal limits  SARS CORONAVIRUS 2 (HOSPITAL ORDER, [place] [facility])  URINALYSIS, ROUTINE W REFLEX MICROSCOPIC  TYPE AND SCREEN  PREPARE RBC (CROSSMATCH)  ABO/RH    No orders to display    Medications  0.9 %  sodium chloride infusion (has no administration in time range)  sodium chloride 0.9 % bolus 500 mL (has no administration in time range)     Procedures Critical Care Critical Care Documentation Critical care time provided by me (excluding procedures): 36 minutes  Condition necessitating critical care: Symptomatic anemia  Components of critical care management: reviewing of prior records, laboratory and imaging interpretation, frequent re-examination and reassessment of vital signs, administration of packed red blood cells, IV fluids, discussion with consulting services.   ED Course and Medical Decision Making  I have reviewed the triage vital signs and the nursing notes.  Pertinent labs & imaging results that were available during my care of the patient were reviewed by me and considered in my medical decision making (see below for details).  Symptomatic anemia in this 73 year old male, hemoglobin confirmed at 5.9 here in the emergency department.  Hemodynamically stable, well-appearing, ambulating without issue, benign abdomen.  Labs are also revealing more of a pancytopenia with a low WBC count as well as low platelets.  Differential added onto the work-up, also considering MDS or leukemia.  Will transfuse 2 units packed red blood cells, consult hospitalist for admission.  Admitted to hospital service for further care.   Barth Kirks. [place] Small, [place] [email or web address]  Final Clinical Impressions(s) / ED Diagnoses     ICD-10-CM  1. Pancytopenia ([place]) D61.818   2. Symptomatic anemia D64.9     ED Discharge Orders    None         Maudie Flakes, MD 03/22/19  [id number]

## 2019-03-22 NOTE — ED Triage Notes (Signed)
Pt sent by Dr. Vernie Ammons at [facility] for low Hg of 5.7. Hx of kidney disease w/transplant, denies any bleeding, weakness or sob. Sent for blood transfusion

## 2019-03-23 DIAGNOSIS — Z8249 Family history of ischemic heart disease and other diseases of the circulatory system: Secondary | ICD-10-CM | POA: Diagnosis not present

## 2019-03-23 DIAGNOSIS — Z888 Allergy status to other drugs, medicaments and biological substances status: Secondary | ICD-10-CM | POA: Diagnosis not present

## 2019-03-23 DIAGNOSIS — D61818 Other pancytopenia: Secondary | ICD-10-CM

## 2019-03-23 DIAGNOSIS — Z961 Presence of intraocular lens: Secondary | ICD-10-CM | POA: Diagnosis present

## 2019-03-23 DIAGNOSIS — I10 Essential (primary) hypertension: Secondary | ICD-10-CM

## 2019-03-23 DIAGNOSIS — Z9841 Cataract extraction status, right eye: Secondary | ICD-10-CM | POA: Diagnosis not present

## 2019-03-23 DIAGNOSIS — Z20828 Contact with and (suspected) exposure to other viral communicable diseases: Secondary | ICD-10-CM | POA: Diagnosis present

## 2019-03-23 DIAGNOSIS — N400 Enlarged prostate without lower urinary tract symptoms: Secondary | ICD-10-CM | POA: Diagnosis present

## 2019-03-23 DIAGNOSIS — Z833 Family history of diabetes mellitus: Secondary | ICD-10-CM | POA: Diagnosis not present

## 2019-03-23 DIAGNOSIS — Z940 Kidney transplant status: Secondary | ICD-10-CM | POA: Diagnosis not present

## 2019-03-23 DIAGNOSIS — E119 Type 2 diabetes mellitus without complications: Secondary | ICD-10-CM

## 2019-03-23 DIAGNOSIS — Z794 Long term (current) use of insulin: Secondary | ICD-10-CM | POA: Diagnosis not present

## 2019-03-23 DIAGNOSIS — E113599 Type 2 diabetes mellitus with proliferative diabetic retinopathy without macular edema, unspecified eye: Secondary | ICD-10-CM | POA: Diagnosis present

## 2019-03-23 DIAGNOSIS — Z91040 Latex allergy status: Secondary | ICD-10-CM | POA: Diagnosis not present

## 2019-03-23 DIAGNOSIS — E7800 Pure hypercholesterolemia, unspecified: Secondary | ICD-10-CM | POA: Diagnosis present

## 2019-03-23 DIAGNOSIS — M1611 Unilateral primary osteoarthritis, right hip: Secondary | ICD-10-CM | POA: Diagnosis present

## 2019-03-23 DIAGNOSIS — Z87891 Personal history of nicotine dependence: Secondary | ICD-10-CM | POA: Diagnosis not present

## 2019-03-23 DIAGNOSIS — D649 Anemia, unspecified: Secondary | ICD-10-CM | POA: Diagnosis not present

## 2019-03-23 DIAGNOSIS — Z885 Allergy status to narcotic agent status: Secondary | ICD-10-CM | POA: Diagnosis not present

## 2019-03-23 DIAGNOSIS — D61811 Other drug-induced pancytopenia: Secondary | ICD-10-CM | POA: Diagnosis present

## 2019-03-23 DIAGNOSIS — Z803 Family history of malignant neoplasm of breast: Secondary | ICD-10-CM | POA: Diagnosis not present

## 2019-03-23 DIAGNOSIS — T451X5A Adverse effect of antineoplastic and immunosuppressive drugs, initial encounter: Secondary | ICD-10-CM | POA: Diagnosis present

## 2019-03-23 DIAGNOSIS — Z91048 Other nonmedicinal substance allergy status: Secondary | ICD-10-CM | POA: Diagnosis not present

## 2019-03-23 DIAGNOSIS — Z79899 Other long term (current) drug therapy: Secondary | ICD-10-CM | POA: Diagnosis not present

## 2019-03-23 LAB — BASIC METABOLIC PANEL
Anion gap: 7 (ref 5–15)
BUN: 10 mg/dL (ref 8–23)
CO2: 24 mmol/L (ref 22–32)
Calcium: 9.9 mg/dL (ref 8.9–10.3)
Chloride: 109 mmol/L (ref 98–111)
Creatinine, Ser: 1.29 mg/dL — ABNORMAL HIGH (ref 0.61–1.24)
GFR calc Af Amer: >60 mL/min (ref >60)
GFR calc non Af Amer: 56 mL/min — ABNORMAL LOW (ref >60)
Glucose, Bld: 167 mg/dL — ABNORMAL HIGH (ref 70–99)
Potassium: 4.1 mmol/L (ref 3.5–5.1)
Sodium: 140 mmol/L (ref 135–145)

## 2019-03-23 LAB — TYPE AND SCREEN
ABO/RH(D): A POS
Antibody Screen: NEGATIVE
Unit division: 0
Unit division: 0

## 2019-03-23 LAB — BPAM RBC
Blood Product Expiration Date: 202006192359
Blood Product Expiration Date: 202006192359
ISSUE DATE / TIME: 202006051544
ISSUE DATE / TIME: 202006052014
Unit Type and Rh: 6200
Unit Type and Rh: 6200

## 2019-03-23 LAB — GLUCOSE, CAPILLARY
Glucose-Capillary: 122 mg/dL — ABNORMAL HIGH (ref 70–99)
Glucose-Capillary: 167 mg/dL — ABNORMAL HIGH (ref 70–99)
Glucose-Capillary: 139 mg/dL — ABNORMAL HIGH (ref 70–99)
Glucose-Capillary: 209 mg/dL — ABNORMAL HIGH (ref 70–99)

## 2019-03-23 LAB — CBC
HCT: 23 % — ABNORMAL LOW (ref 39.0–52.0)
Hemoglobin: 7.4 g/dL — ABNORMAL LOW (ref 13.0–17.0)
MCH: 33.3 pg (ref 26.0–34.0)
MCHC: 32.2 g/dL (ref 30.0–36.0)
MCV: 103.6 fL — ABNORMAL HIGH (ref 80.0–100.0)
Platelets: 64 10*3/uL — ABNORMAL LOW (ref 150–400)
RBC: 2.22 MIL/uL — ABNORMAL LOW (ref 4.22–5.81)
RDW: 23.4 % — ABNORMAL HIGH (ref 11.5–15.5)
WBC: 3.6 10*3/uL — ABNORMAL LOW (ref 4.0–10.5)
nRBC: 4.4 % — ABNORMAL HIGH (ref 0.0–0.2)

## 2019-03-23 LAB — IRON AND TIBC
Iron: 134 ug/dL (ref 45–182)
Saturation Ratios: 41 % — ABNORMAL HIGH (ref 17.9–39.5)
TIBC: 326 ug/dL (ref 250–450)
UIBC: 192 ug/dL

## 2019-03-23 LAB — VITAMIN B12: Vitamin B-12: 944 pg/mL — ABNORMAL HIGH (ref 180–914)

## 2019-03-23 LAB — RETICULOCYTES
Immature Retic Fract: 22.7 % — ABNORMAL HIGH (ref 2.3–15.9)
RBC.: 2.28 MIL/uL — ABNORMAL LOW (ref 4.22–5.81)
Retic Count, Absolute: 64.5 10*3/uL (ref 19.0–186.0)
Retic Ct Pct: 2.8 % (ref 0.4–3.1)

## 2019-03-23 LAB — FOLATE: Folate: 13.2 ng/mL (ref >5.9)

## 2019-03-23 LAB — FERRITIN: Ferritin: 1060 ng/mL — ABNORMAL HIGH (ref 24–336)

## 2019-03-23 MED ORDER — AMLODIPINE BESYLATE 5 MG PO TABS
5.0000 mg | ORAL_TABLET | Freq: Every day | ORAL | Status: DC
  Administered 2019-03-24: 5 mg via ORAL
  Filled 2019-03-23: qty 1

## 2019-03-23 MED ORDER — MYCOPHENOLATE SODIUM 180 MG PO TBEC
180.0000 mg | DELAYED_RELEASE_TABLET | Freq: Two times a day (BID) | ORAL | Status: DC
  Administered 2019-03-24: 180 mg via ORAL
  Filled 2019-03-23 (×2): qty 1

## 2019-03-23 MED ORDER — SULFAMETHOXAZOLE-TRIMETHOPRIM 400-80 MG PO TABS: 1.0000 | ORAL_TABLET | ORAL | Status: DC

## 2019-03-23 MED ORDER — HYDRALAZINE HCL 20 MG/ML IJ SOLN
5.0000 mg | Freq: Four times a day (QID) | INTRAMUSCULAR | Status: DC | PRN
  Administered 2019-03-23: 5 mg via INTRAVENOUS
  Filled 2019-03-23: qty 1

## 2019-03-23 NOTE — Consult Note (Signed)
[facility] Renal Consultation Note  Requesting MD: Karleen Hampshire Indication for Consultation: s/p renal transplant   HPI:  [name] is a 73 y.o. 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 Marval Regal at [facility]- fairly good allograft function with crt in the mid 1's.   He was sent to the hospital on 6/5 in response to labs at [facility] finding a hgb in the 5's.  He denies overt bleeding or stool changes.  Upon further review it seems that patient has a pancytopenia- WBC 2.7 and platelets of 60K.  He was admitted to obs- transfused 2 units on 6/5- hgb 7.4 today.  Blood pressure has been high here- on many meds- he admits this is not unusual.  Upon reviewing meds, he is noted to be on myfortic, bactrim - both of which can cause pancytopenia.  Unfortunately I do not have access to know what pts counts were like prior to this    Creatinine  Date/Time Value Ref Range Status  07/04/2013 12:56 PM 10.86 (H) 0.60 - 1.30 mg/dL Final  11/22/2011 08:41 AM 11.73 (H) 0.60 - 1.30 mg/dL Final  09/29/2011 10.0 (A) 0.6 - 1.3 mg/dL Final   Creatinine, Ser  Date/Time Value Ref Range Status  03/22/2019 01:39 PM 1.49 (H) 0.61 - 1.24 mg/dL Final  02/17/2015 09:36 AM 11.54 (H) 0.61 - 1.24 mg/dL Final  04/24/2014 05:02 PM 11.67 (H) 0.50 - 1.35 mg/dL Final  09/04/2013 06:56 AM 9.11 (H) 0.50 - 1.35 mg/dL Final  03/28/2013 09:22 AM 11.52 (H) 0.50 - 1.35 mg/dL Final  11/01/2012 09:20 AM 8.84 (H) 0.50 - 1.35 mg/dL Final  10/03/2012 07:12 AM 13.11 (H) 0.50 - 1.35 mg/dL Final  10/02/2012 05:40 AM 9.46 (H) 0.50 - 1.35 mg/dL Final  10/01/2012 02:05 PM 7.00 (H) 0.50 - 1.35 mg/dL Final  05/06/2009 10:20 AM 13.72 (H) 0.4 - 1.5 mg/dL Final     PMHx:   Past Medical History:  Diagnosis Date  . Anemia    hx of at beginnning of dialysis- 05/2009  . Arthritis    right hip   . End stage renal disease ([place])    [place]   . End stage  renal disease on dialysis ([place])   . Essential hypertension, malignant   . Hypertrophy of prostate without urinary obstruction and other lower urinary tract symptoms (LUTS)   . Pure hypercholesterolemia   . Renal insufficiency   . Type II or unspecified type diabetes mellitus without mention of complication, not stated as uncontrolled    TYPE 2     Past Surgical History:  Procedure Laterality Date  . A/V FISTULAGRAM Left 06/12/2017   Procedure: A/V Fistulagram;  Surgeon: Algernon Huxley, MD;  Location: [place] [facility] [facility];  Service: Cardiovascular;  Laterality: Left;  . CARDIAC CATHETERIZATION  04/29/10   normal coronaries, LVEF 45%, mild global LV hypokinesis ([place])  . CATARACT EXTRACTION W/PHACO Right 04/03/2013   Procedure: CATARACT EXTRACTION PHACO AND INTRAOCULAR LENS PLACEMENT (IOC);  Surgeon: Adonis Brook, MD;  Location: [facility];  Service: Ophthalmology;  Laterality: Right;  . CERVICAL DISCECTOMY  03/1999  . COLONOSCOPY     Hx: of  . CYSTOSCOPY WITH BIOPSY  11/01/2012   Procedure: CYSTOSCOPY WITH BIOPSY;  Surgeon: Molli Hazard, MD;  Location: [facility];  Service: Urology;  Laterality: N/A;  with fulgeration  . CYSTOSCOPY WITH RETROGRADE PYELOGRAM, URETEROSCOPY AND STENT PLACEMENT  11/01/2012   Procedure: CYSTOSCOPY  WITH RETROGRADE PYELOGRAM, URETEROSCOPY AND STENT PLACEMENT;  Surgeon: Molli Hazard, MD;  Location: [facility];  Service: Urology;  Laterality: Bilateral;  . DIALYSIS FISTULA CREATION  2010  . EYE SURGERY  2012   cataracts  . EYE SURGERY  11/2014  . HOLMIUM LASER APPLICATION  02/22/3266   Procedure: HOLMIUM LASER APPLICATION;  Surgeon: Molli Hazard, MD;  Location: [facility];  Service: Urology;  Laterality: Bilateral;  . KIDNEY TRANSPLANT  2016  . MEMBRANE PEEL Right 09/04/2013   Procedure: MEMBRANE PEEL;  Surgeon: Adonis Brook, MD;  Location: [place];  Service: Ophthalmology;  Laterality: Right;  . NECK SURGERY  1990  . PARS PLANA VITRECTOMY Right  09/04/2013   Procedure: PARS PLANA VITRECTOMY WITH 23 GAUGE;  Surgeon: Adonis Brook, MD;  Location: [facility];  Service: Ophthalmology;  Laterality: Right;  . PARS PLANA VITRECTOMY Left 02/17/2015  . PARS PLANA VITRECTOMY Left 02/17/2015   Procedure: PARS PLANA VITRECTOMY WITH 25G REMOVAL/SUTURE INTRAOCULAR LENS;  Surgeon: Hayden Pedro, MD;  Location: [facility];  Service: Ophthalmology;  Laterality: Left;  . PERIPHERAL VASCULAR CATHETERIZATION Left 08/08/2016   Procedure: A/V Shuntogram/Fistulagram;  Surgeon: Algernon Huxley, MD;  Location: [facility];  Service: Cardiovascular;  Laterality: Left;  . PERIPHERAL VASCULAR CATHETERIZATION N/A 08/08/2016   Procedure: A/V Shunt Intervention;  Surgeon: Algernon Huxley, MD;  Location: [facility];  Service: Cardiovascular;  Laterality: N/A;  . PHOTOCOAGULATION WITH LASER Right 09/04/2013   Procedure: PHOTOCOAGULATION WITH LASER;  Surgeon: Adonis Brook, MD;  Location: [facility];  Service: Ophthalmology;  Laterality: Right;  ENDOLASER  . TRANSTHORACIC ECHOCARDIOGRAM  06/19/2012   normal LV sys function, EF 55-60%, mild to mod diastolic dysfunction, mild AI ([place])    Family Hx:  Family History  Problem Relation Age of Onset  . Breast cancer Mother 21       deceased  . Pneumonia Father 14       deceased  . Hypertension Brother   . Diabetes Mellitus II Brother     Social History:  reports that he quit smoking about 28 years ago. His smoking use included cigarettes. He has never used smokeless tobacco. He reports current alcohol use. He reports that he does not use drugs.  Allergies:  Allergies  Allergen Reactions  . Codeine Nausea And Vomiting and Nausea Only  . Doxycycline Nausea And Vomiting and Nausea Only  . Latex Hives and Rash  . Tape Other (See Comments)    The "plastic, waffle-printed" tape tears and bruises the skin    Medications: Prior to Admission medications   Medication Sig Start Date End Date Taking?  Authorizing Provider  acetaminophen (TYLENOL) 325 MG tablet Take 650 mg by mouth every 6 (six) hours as needed (for pain or headaches).    Yes [provider]  allopurinol (ZYLOPRIM) 300 MG tablet Take 300 mg by mouth as needed (as directed for gout flares).    Yes [provider]  amLODipine (NORVASC) 2.5 MG tablet Take 2.5 mg by mouth daily. 12/30/18  Yes [provider]  donepezil (ARICEPT) 10 MG tablet Take 10 mg by mouth at bedtime.   Yes [provider]  dorzolamide (TRUSOPT) 2 % ophthalmic solution Place 1 drop into both eyes 2 (two) times a day.  12/06/18  Yes [provider]  doxazosin (CARDURA) 4 MG tablet Take 4 mg by mouth 2 (two) times daily. 02/28/19  Yes [provider]  furosemide (LASIX) 40 MG tablet Take  40-80 mg by mouth See admin instructions. Take 80 mg by mouth in the morning and 40 mg in the evening   Yes [provider]  HUMALOG KWIKPEN 100 UNIT/ML KiwkPen Inject 8-16 Units into the skin See admin instructions. Inject 8-16 units into the skin three times a day before meals, PER SLIDING SCALE (BGL 150 or less, use nothing) 04/29/18  Yes [provider]  Insulin Glargine (LANTUS SOLOSTAR) 100 UNIT/ML Solostar Pen Inject 34 Units into the skin daily before breakfast.  05/19/16  Yes [provider]  K Phos Mono-Sod Phos Di & Mono (PHOSPHA 250 NEUTRAL) [phone] MG TABS Take 1-2 tablets by mouth See admin instructions. Take 2 tablets by mouth in the morning and 1 tablet at bedtime at 8 PM 02/01/19  Yes [provider]  ketoconazole (NIZORAL) 2 % shampoo Apply 1 application topically every 7 (seven) days.  04/16/18  Yes [provider]  KLOR-CON M20 20 MEQ tablet Take 20 mEq by mouth daily. 05/10/18  Yes [provider]  latanoprost (XALATAN) 0.005 % ophthalmic solution Place 1 drop into both eyes at bedtime.    Yes [provider]  linagliptin (TRADJENTA) 5 MG TABS tablet Take  5 mg by mouth daily with breakfast.    Yes [provider]  losartan (COZAAR) 100 MG tablet Take 100 mg by mouth daily.   Yes [provider]  metoprolol tartrate (LOPRESSOR) 25 MG tablet Take 25 mg by mouth 2 (two) times daily.   Yes [provider]  Multiple Vitamins-Minerals (ONE-A-DAY MENS 50+ ADVANTAGE) TABS Take 1 tablet by mouth daily with breakfast.   Yes [provider]  mycophenolate (MYFORTIC) 360 MG TBEC EC tablet Take 360 mg by mouth 2 (two) times daily.  06/19/18  Yes [provider]  NON FORMULARY Apply 1 application topically See admin instructions. Skin-So-Soft lotion: Apply as needed to dry sites of skin   Yes [provider]  sodium chloride (OCEAN) 0.65 % SOLN nasal spray Place 2 sprays into both nostrils as needed for congestion.    Yes [provider]  sulfamethoxazole-trimethoprim (BACTRIM) 400-80 MG tablet Take 1 tablet by mouth daily. 02/21/19  Yes [provider]  tacrolimus (PROGRAF) 0.5 MG capsule Take 0.5 mg by mouth 2 (two) times daily. 02/28/19  Yes [provider]  tacrolimus (PROGRAF) 1 MG capsule Take 3 mg by mouth 2 (two) times daily.  06/04/18  Yes [provider]  tadalafil (CIALIS) 20 MG tablet Take 20 mg by mouth daily as needed for erectile dysfunction.    Yes [provider]  Vitamin D, Ergocalciferol, (DRISDOL) 50000 units CAPS capsule Take 50,000 Units by mouth every 7 (seven) days.  03/21/16   [provider]    I have reviewed the patient's current medications.  Labs:  Results for orders placed or performed during the hospital encounter of 03/22/19 (from the past 48 hour(s))  Type and screen [place]     Status: None (Preliminary result)   Collection Time: 03/22/19  1:35 PM  Result Value Ref Range   ABO/RH(D) A POS    Antibody Screen NEG    Sample Expiration 03/25/2019,2359    Unit Number [id number]    Blood Component Type RBC,  LR IRR    Unit division 00    Status of Unit ISSUED    Transfusion Status OK TO TRANSFUSE    Crossmatch Result Compatible    Unit Number [id number]    Blood Component  Type RBC, LR IRR    Unit division 00    Status of Unit ISSUED    Transfusion Status OK TO TRANSFUSE    Crossmatch Result      Compatible Performed at [facility], [place] [address] [place], [place] [address]   Prepare RBC     Status: None   Collection Time: 03/22/19  1:35 PM  Result Value Ref Range   Order Confirmation      ORDER PROCESSED BY BLOOD BANK Performed at [facility], [place] [address] [place], [place] [address]   ABO/Rh     Status: None   Collection Time: 03/22/19  1:35 PM  Result Value Ref Range   ABO/RH(D)      A POS Performed at [facility] [address], [place], [place] [address]   Comprehensive metabolic panel     Status: Abnormal   Collection Time: 03/22/19  1:39 PM  Result Value Ref Range   Sodium 141 135 - 145 mmol/L   Potassium 3.9 3.5 - 5.1 mmol/L   Chloride 112 (H) 98 - 111 mmol/L   CO2 22 22 - 32 mmol/L   Glucose, Bld 221 (H) 70 - 99 mg/dL   BUN 12 8 - 23 mg/dL   Creatinine, Ser 1.49 (H) 0.61 - 1.24 mg/dL   Calcium 10.2 8.9 - 10.3 mg/dL   Total Protein 7.1 6.5 - 8.1 g/dL   Albumin 4.3 3.5 - 5.0 g/dL   AST 19 15 - 41 U/L   ALT 13 0 - 44 U/L   Alkaline Phosphatase 66 38 - 126 U/L   Total Bilirubin 1.6 (H) 0.3 - 1.2 mg/dL   GFR calc non Af Amer 47 (L) >60 mL/min   GFR calc Af Amer 54 (L) >60 mL/min   Anion gap 7 5 - 15    Comment: Performed at [facility] [address] [place] [address]  CBC     Status: Abnormal   Collection Time: 03/22/19  1:39 PM  Result Value Ref Range   WBC 2.7 (L) 4.0 - 10.5 K/uL   RBC 1.69 (L) 4.22 - 5.81 MIL/uL   Hemoglobin 5.9 (LL) 13.0 - 17.0 g/dL    Comment: REPEATED TO VERIFY THIS CRITICAL RESULT HAS VERIFIED AND BEEN CALLED TO J BLUE RN BY ALLISON BENNETT ON 06 05 2020 AT 1408, AND HAS BEEN READ BACK.      HCT 19.4 (L) 39.0 - 52.0 %   MCV 114.8 (H) 80.0 - 100.0 fL   MCH 34.9 (H) 26.0 - 34.0 pg   MCHC 30.4 30.0 - 36.0 g/dL   RDW 20.5 (H) 11.5 - 15.5 %   Platelets 67 (L) 150 - 400 K/uL    Comment: REPEATED TO VERIFY PLATELET COUNT CONFIRMED BY SMEAR Immature Platelet Fraction may be clinically indicated, consider ordering this additional test [id number]    nRBC 4.0 (H) 0.0 - 0.2 %    Comment: Performed at [facility] [address], [place], [place] [address]  Differential     Status: Abnormal   Collection Time: 03/22/19  3:00 PM  Result Value Ref Range   Neutrophils Relative % 36 %   Neutro Abs 1.0 (L) 1.7 - 7.7 K/uL   Lymphocytes Relative 39 %   Lymphs Abs 1.1 0.7 - 4.0 K/uL   Monocytes Relative 7 %   Monocytes Absolute 0.2 0.1 - 1.0 K/uL   Eosinophils Relative 0 %  Eosinophils Absolute 0.0 0.0 - 0.5 K/uL   Basophils Relative 0 %   Basophils Absolute 0.0 0.0 - 0.1 K/uL   Immature Granulocytes 18 %   Abs Immature Granulocytes 0.50 (H) 0.00 - 0.07 K/uL    Comment: Performed at [facility], [place] [address], [place], [place] [address]  CBC     Status: Abnormal   Collection Time: 03/22/19  3:00 PM  Result Value Ref Range   WBC 2.8 (L) 4.0 - 10.5 K/uL    Comment: REPEATED TO VERIFY WHITE COUNT CONFIRMED ON SMEAR    RBC 1.70 (L) 4.22 - 5.81 MIL/uL   Hemoglobin 5.9 (LL) 13.0 - 17.0 g/dL    Comment: REPEATED TO VERIFY THIS CRITICAL RESULT HAS VERIFIED AND BEEN CALLED TO S CRUZ RN BY KIRSTENE FORSYTH ON 06 05 2020 AT 51, AND HAS BEEN READ BACK.     HCT 19.1 (L) 39.0 - 52.0 %   MCV 112.4 (H) 80.0 - 100.0 fL   MCH 34.7 (H) 26.0 - 34.0 pg   MCHC 30.9 30.0 - 36.0 g/dL   RDW 20.8 (H) 11.5 - 15.5 %   Platelets 68 (L) 150 - 400 K/uL    Comment: REPEATED TO VERIFY PLATELET COUNT CONFIRMED BY SMEAR SPECIMEN CHECKED FOR CLOTS Immature Platelet Fraction may be clinically indicated, consider ordering this additional test [id number]    nRBC 4.3 (H) 0.0 - 0.2 %     Comment: Performed at [facility] [address] [place], [place] [address]  Urinalysis, Routine w reflex microscopic     Status: None   Collection Time: 03/22/19  4:09 PM  Result Value Ref Range   Color, Urine YELLOW YELLOW   APPearance CLEAR CLEAR   Specific Gravity, Urine 1.010 1.005 - 1.030   pH 6.0 5.0 - 8.0   Glucose, UA NEGATIVE NEGATIVE mg/dL   Hgb urine dipstick NEGATIVE NEGATIVE   Bilirubin Urine NEGATIVE NEGATIVE   Ketones, ur NEGATIVE NEGATIVE mg/dL   Protein, ur NEGATIVE NEGATIVE mg/dL   Nitrite NEGATIVE NEGATIVE   Leukocytes,Ua NEGATIVE NEGATIVE    Comment: Performed at [facility] [address], [place], [place] [address]  SARS Coronavirus 2 (CEPHEID - Performed in [facility] hospital lab), Hosp Order     Status: None   Collection Time: 03/22/19  4:09 PM  Result Value Ref Range   SARS Coronavirus 2 NEGATIVE NEGATIVE    Comment: (NOTE) If result is NEGATIVE SARS-CoV-2 target nucleic acids are NOT DETECTED. The SARS-CoV-2 RNA is generally detectable in upper and lower  respiratory specimens during the acute phase of infection. The lowest  concentration of SARS-CoV-2 viral copies this assay can detect is 250  copies / mL. A negative result does not preclude SARS-CoV-2 infection  and should not be used as the sole basis for treatment or other  patient management decisions.  A negative result may occur with  improper specimen collection / handling, submission of specimen other  than nasopharyngeal swab, presence of viral mutation(s) within the  areas targeted by this assay, and inadequate number of viral copies  (<250 copies / mL). A negative result must be combined with clinical  observations, patient history, and epidemiological information. If result is POSITIVE SARS-CoV-2 target nucleic acids are DETECTED. The SARS-CoV-2 RNA is generally detectable in upper and lower  respiratory specimens dur ing the acute phase of infection.  Positive  results  are indicative of active infection with SARS-CoV-2.  Clinical  correlation with  patient history and other diagnostic information is  necessary to determine patient infection status.  Positive results do  not rule out bacterial infection or co-infection with other viruses. If result is PRESUMPTIVE POSTIVE SARS-CoV-2 nucleic acids MAY BE PRESENT.   A presumptive positive result was obtained on the submitted specimen  and confirmed on repeat testing.  While 2019 novel coronavirus  (SARS-CoV-2) nucleic acids may be present in the submitted sample  additional confirmatory testing may be necessary for epidemiological  and / or clinical management purposes  to differentiate between  SARS-CoV-2 and other Sarbecovirus currently known to infect humans.  If clinically indicated additional testing with an alternate test  methodology [phone]) is advised. The SARS-CoV-2 RNA is generally  detectable in upper and lower respiratory sp ecimens during the acute  phase of infection. The expected result is Negative. Fact Sheet for Patients:  StrictlyIdeas.no Fact Sheet for Healthcare Providers: BankingDealers.co.za This test is not yet approved or cleared by the [place] FDA and has been authorized for detection and/or diagnosis of SARS-CoV-2 by FDA under an Emergency Use Authorization (EUA).  This EUA will remain in effect (meaning this test can be used) for the duration of the COVID-19 declaration under Section 564(b)(1) of the Act, 21 U.S.C. section 360bbb-3(b)(1), unless the authorization is terminated or revoked sooner. Performed at [facility], [place] [address] [place], [place] [address]   Glucose, capillary     Status: Abnormal   Collection Time: 03/22/19  9:20 PM  Result Value Ref Range   Glucose-Capillary 245 (H) 70 - 99 mg/dL  CBC     Status: Abnormal   Collection Time: 03/23/19  4:03 AM  Result Value Ref Range   WBC 3.6 (L) 4.0 -  10.5 K/uL   RBC 2.22 (L) 4.22 - 5.81 MIL/uL   Hemoglobin 7.4 (L) 13.0 - 17.0 g/dL    Comment: POST TRANSFUSION SPECIMEN   HCT 23.0 (L) 39.0 - 52.0 %   MCV 103.6 (H) 80.0 - 100.0 fL    Comment: POST TRANSFUSION SPECIMEN   MCH 33.3 26.0 - 34.0 pg   MCHC 32.2 30.0 - 36.0 g/dL   RDW 23.4 (H) 11.5 - 15.5 %   Platelets 64 (L) 150 - 400 K/uL    Comment: Immature Platelet Fraction may be clinically indicated, consider ordering this additional test [id number] CONSISTENT WITH PREVIOUS RESULT    nRBC 4.4 (H) 0.0 - 0.2 %    Comment: Performed at [facility], [place] [address], [place], [place] [address]  Glucose, capillary     Status: Abnormal   Collection Time: 03/23/19  7:49 AM  Result Value Ref Range   Glucose-Capillary 122 (H) 70 - 99 mg/dL  Glucose, capillary     Status: Abnormal   Collection Time: 03/23/19 11:54 AM  Result Value Ref Range   Glucose-Capillary 167 (H) 70 - 99 mg/dL     ROS:  A comprehensive review of systems was negative.  Physical Exam: Vitals:   03/22/19 2304 03/23/19 0436  BP: (!) 169/71 (!) 173/72  Pulse: 72 62  Resp: 17 18  Temp: 98.6 F (37 C) 98.5 F (36.9 C)  SpO2: 99% 99%     General: NAD HEENT: PERRLA, EOMI Neck: no JVD Heart: RRR Lungs: clear Abdomen: soft, non tender Extremities: no edema Skin: warm and dry Neuro: alert, non focal   Assessment/Plan: 73 year old BM with DM and HTN as well as ESRD- s/p renal transplant in 2016.  Now with  pancytopenia  1.Renal- s/p renal transplant.  Allograft function seeming to be good- crt 1.4 to 1.5-  2. Pancytopenia-  He no longer takes allopurinol.  He takes bactrim for prophylaxis in the setting of immune suppression, needs to continue but maybe can dec to MWF- is usual regimen for tx.  Is on high dose myfortic, would like to decrease from 360 BID to 180 BID to assist with the bone marrow suppression- will discuss these changes with Dr. Marval Regal and see that he gets follow up labs.  He denies overt  bleeding so would think blood count would stay stable in the short term 3. Hypertension/volume  - BP high.  He is already on many meds- the only change I might make is to increase the norvasc to 5 mg daily  4. Immune suppression-  Cont prograf 3.5 BID and will dec myfortic to 180 BID in the setting of pancytopenia 5. Dispo- I dont see any reason why this couldn't be followed as OP  - OK for discharge from my standpoint     Louis Meckel 03/23/2019, 12:15 PM

## 2019-03-23 NOTE — Plan of Care (Signed)
  Problem: Education: Goal: Knowledge of General Education information will improve Description Including pain rating scale, medication(s)/side effects and non-pharmacologic comfort measures Outcome: Progressing   Problem: Activity: Goal: Risk for activity intolerance will decrease Outcome: Progressing   Problem: Pain Managment: Goal: General experience of comfort will improve Outcome: Not Applicable

## 2019-03-23 NOTE — Progress Notes (Signed)
PROGRESS NOTE    [name]  GYI:[id number] DOB: April 23, 1946 DOA: 03/22/2019 PCP: Rogers Blocker, MD   Brief Narrative:  [name] is a 73 y.o. male with medical history significant of renal failure status post kidney transplant 2016 with no hospitalizations since then 2 diabetes, hypercholesterolemia, prostate hypertrophy, right hip arthritis presents at the request of Dr. Raquel Sarna after blood work showed a hemoglobin to be 5.9.  Went to see him and was complaining of 2 to 3 weeks of progressively worsening fatigue and dyspnea on exertion. Hemoglobin found to be 5.9 but patient also with pancytopenia white blood cell count of 2.7 and a platelet count of approximately 67.  Assessment & Plan:   Principal Problem:   Symptomatic anemia Active Problems:   HTN (hypertension)   Diabetes mellitus (HCC)   Proliferative diabetic retinopathy ([place])   History of kidney transplant   Pancytopenia (HCC)  Symptomatic anemia:  Differential include myfortic use vs anemia of blood loss from diverticulosis.  S/p 2 units of prbc transfusion, and repeat H&h is 7.4.  Get stool for occult blood. Repeat H&h tonight and in am and transfuse to keep hemoglobin greater than 7.  Pt denies using NSAIDS.    Essential hypertension;  Sub optimally controlled.  Restarted home meds and increased the norvasc to 5 mg daily.    Type 2 DM: CBG (last 3)  Recent Labs    03/22/19 2120 03/23/19 0749 03/23/19 1154  GLUCAP 245* 122* 167*   Resume SSI.   H/O OF kidney transplant:  Resume prograf and decrease the dose of myfortic to 180 mg BID as per nephrology recommendations.    Pancytopenia:  Probably from bone marrow suppression from myfortic.  Platelet count stable around 60's.  No obvious signs of bleeding.     DVT prophylaxis: SCD'S Code Status: full code.  Family Communication:none at bedside.  Disposition Plan:  Pending rechecking the hemoglobin, evaluation of anemia .    Consultants:   Nephrology   Procedures:none.   Antimicrobials: none.   Subjective: Pt reports being tired and exhausted, little dizzy when walking.   Objective: Vitals:   03/22/19 2040 03/22/19 2304 03/23/19 0436 03/23/19 0600  BP: (!) 168/72 (!) 169/71 (!) 173/72   Pulse: 68 72 62   Resp: 20 17 18    Temp: 98.2 F (36.8 C) 98.6 F (37 C) 98.5 F (36.9 C)   TempSrc: Oral Oral Oral   SpO2: 100% 99% 99%   Weight:    103 kg  Height:        Intake/Output Summary (Last 24 hours) at 03/23/2019 1452 Last data filed at 03/23/2019 0942 Gross per 24 hour  Intake 1163 ml  Output -  Net 1163 ml   Filed Weights   03/22/19 1900 03/23/19 0600  Weight: 100.8 kg 103 kg    Examination:  General exam: Appears calm and comfortable  Respiratory system: Clear to auscultation. Respiratory effort normal. Cardiovascular system: S1 & S2 heard, RRR. No JVD,  Gastrointestinal system: Abdomen is nondistended, soft and nontender. No organomegaly or masses felt. Normal bowel sounds heard. Central nervous system: Alert and oriented. No focal neurological deficits. Extremities: Symmetric 5 x 5 power. Skin: No rashes, lesions or ulcers Psychiatry: Mood & affect appropriate.     Data Reviewed: I have personally reviewed following labs and imaging studies  CBC: Recent Labs  Lab 03/22/19 1339 03/22/19 1500 03/23/19 0403  WBC 2.7* 2.8* 3.6*  NEUTROABS  --  1.0*  --  HGB 5.9* 5.9* 7.4*  HCT 19.4* 19.1* 23.0*  MCV 114.8* 112.4* 103.6*  PLT 67* 68* 64*   Basic Metabolic Panel: Recent Labs  Lab 03/22/19 1339 03/23/19 1256  NA 141 140  K 3.9 4.1  CL 112* 109  CO2 22 24  GLUCOSE 221* 167*  BUN 12 10  CREATININE 1.49* 1.29*  CALCIUM 10.2 9.9   GFR: Estimated Creatinine Clearance: 66.2 mL/min (A) (by C-G formula based on SCr of 1.29 mg/dL (H)). Liver Function Tests: Recent Labs  Lab 03/22/19 1339  AST 19  ALT 13  ALKPHOS 66  BILITOT 1.6*  PROT 7.1  ALBUMIN 4.3   No  results for input(s): LIPASE, AMYLASE in the last 168 hours. No results for input(s): AMMONIA in the last 168 hours. Coagulation Profile: No results for input(s): INR, PROTIME in the last 168 hours. Cardiac Enzymes: No results for input(s): CKTOTAL, CKMB, CKMBINDEX, TROPONINI in the last 168 hours. BNP (last 3 results) No results for input(s): PROBNP in the last 8760 hours. HbA1C: No results for input(s): HGBA1C in the last 72 hours. CBG: Recent Labs  Lab 03/22/19 2120 03/23/19 0749 03/23/19 1154  GLUCAP 245* 122* 167*   Lipid Profile: No results for input(s): CHOL, HDL, LDLCALC, TRIG, CHOLHDL, LDLDIRECT in the last 72 hours. Thyroid Function Tests: No results for input(s): TSH, T4TOTAL, FREET4, T3FREE, THYROIDAB in the last 72 hours. Anemia Panel: No results for input(s): VITAMINB12, FOLATE, FERRITIN, TIBC, IRON, RETICCTPCT in the last 72 hours. Sepsis Labs: No results for input(s): PROCALCITON, LATICACIDVEN in the last 168 hours.  Recent Results (from the past 240 hour(s))  SARS Coronavirus 2 (CEPHEID - Performed in [facility] lab), Hosp Order     Status: None   Collection Time: 03/22/19  4:09 PM  Result Value Ref Range Status   SARS Coronavirus 2 NEGATIVE NEGATIVE Final    Comment: (NOTE) If result is NEGATIVE SARS-CoV-2 target nucleic acids are NOT DETECTED. The SARS-CoV-2 RNA is generally detectable in upper and lower  respiratory specimens during the acute phase of infection. The lowest  concentration of SARS-CoV-2 viral copies this assay can detect is 250  copies / mL. A negative result does not preclude SARS-CoV-2 infection  and should not be used as the sole basis for treatment or other  patient management decisions.  A negative result may occur with  improper specimen collection / handling, submission of specimen other  than nasopharyngeal swab, presence of viral mutation(s) within the  areas targeted by this assay, and inadequate number of viral copies   (<250 copies / mL). A negative result must be combined with clinical  observations, patient history, and epidemiological information. If result is POSITIVE SARS-CoV-2 target nucleic acids are DETECTED. The SARS-CoV-2 RNA is generally detectable in upper and lower  respiratory specimens dur ing the acute phase of infection.  Positive  results are indicative of active infection with SARS-CoV-2.  Clinical  correlation with patient history and other diagnostic information is  necessary to determine patient infection status.  Positive results do  not rule out bacterial infection or co-infection with other viruses. If result is PRESUMPTIVE POSTIVE SARS-CoV-2 nucleic acids MAY BE PRESENT.   A presumptive positive result was obtained on the submitted specimen  and confirmed on repeat testing.  While 2019 novel coronavirus  (SARS-CoV-2) nucleic acids may be present in the submitted sample  additional confirmatory testing may be necessary for epidemiological  and / or clinical management purposes  to differentiate between  SARS-CoV-2  and other Sarbecovirus currently known to infect humans.  If clinically indicated additional testing with an alternate test  methodology [phone]) is advised. The SARS-CoV-2 RNA is generally  detectable in upper and lower respiratory sp ecimens during the acute  phase of infection. The expected result is Negative. Fact Sheet for Patients:  StrictlyIdeas.no Fact Sheet for Healthcare Providers: BankingDealers.co.za This test is not yet approved or cleared by the [place] FDA and has been authorized for detection and/or diagnosis of SARS-CoV-2 by FDA under an Emergency Use Authorization (EUA).  This EUA will remain in effect (meaning this test can be used) for the duration of the COVID-19 declaration under Section 564(b)(1) of the Act, 21 U.S.C. section 360bbb-3(b)(1), unless the authorization is terminated or  revoked sooner. Performed at [facility], [place] [address] [place], [place] [address]          Radiology Studies: No results found.      Scheduled Meds: . [START ON 03/24/2019] amLODipine  5 mg Oral Daily  . donepezil  10 mg Oral QHS  . dorzolamide  1 drop Both Eyes BID  . doxazosin  4 mg Oral BID  . furosemide  40 mg Oral QPM  . furosemide  80 mg Oral Daily  . insulin aspart  0-15 Units Subcutaneous TID WC  . insulin aspart  0-5 Units Subcutaneous QHS  . insulin glargine  34 Units Subcutaneous QAC breakfast  . latanoprost  1 drop Both Eyes QHS  . linagliptin  5 mg Oral Q breakfast  . losartan  100 mg Oral Daily  . metoprolol tartrate  25 mg Oral BID  . multivitamin with minerals  1 tablet Oral Daily  . phosphorus  250 mg Oral QPM  . phosphorus  500 mg Oral Daily  . potassium chloride SA  20 mEq Oral Daily  . sulfamethoxazole-trimethoprim  1 tablet Oral Daily  . tacrolimus  3.5 mg Oral BID  . Vitamin D (Ergocalciferol)  50,000 Units Oral Q7 days   Continuous Infusions: . sodium chloride       LOS: 0 days    Time spent: 34 minutes     Hosie Poisson, MD [facility] Pager [phone]   If 7PM-7AM, please contact night-coverage [email or web address] Password [email or web address] 03/23/2019, 2:52 PM

## 2019-03-24 DIAGNOSIS — Z940 Kidney transplant status: Secondary | ICD-10-CM

## 2019-03-24 LAB — BASIC METABOLIC PANEL
Anion gap: 9 (ref 5–15)
BUN: 12 mg/dL (ref 8–23)
CO2: 26 mmol/L (ref 22–32)
Calcium: 9.9 mg/dL (ref 8.9–10.3)
Chloride: 106 mmol/L (ref 98–111)
Creatinine, Ser: 1.26 mg/dL — ABNORMAL HIGH (ref 0.61–1.24)
GFR calc Af Amer: >60 mL/min (ref >60)
GFR calc non Af Amer: 57 mL/min — ABNORMAL LOW (ref >60)
Glucose, Bld: 105 mg/dL — ABNORMAL HIGH (ref 70–99)
Potassium: 3.4 mmol/L — ABNORMAL LOW (ref 3.5–5.1)
Sodium: 141 mmol/L (ref 135–145)

## 2019-03-24 LAB — GLUCOSE, CAPILLARY
Glucose-Capillary: 156 mg/dL — ABNORMAL HIGH (ref 70–99)
Glucose-Capillary: 169 mg/dL — ABNORMAL HIGH (ref 70–99)

## 2019-03-24 LAB — CBC
HCT: 23.9 % — ABNORMAL LOW (ref 39.0–52.0)
Hemoglobin: 7.8 g/dL — ABNORMAL LOW (ref 13.0–17.0)
MCH: 33.2 pg (ref 26.0–34.0)
MCHC: 32.6 g/dL (ref 30.0–36.0)
MCV: 101.7 fL — ABNORMAL HIGH (ref 80.0–100.0)
Platelets: 66 10*3/uL — ABNORMAL LOW (ref 150–400)
RBC: 2.35 MIL/uL — ABNORMAL LOW (ref 4.22–5.81)
RDW: 22.5 % — ABNORMAL HIGH (ref 11.5–15.5)
WBC: 3.3 10*3/uL — ABNORMAL LOW (ref 4.0–10.5)
nRBC: 4.2 % — ABNORMAL HIGH (ref 0.0–0.2)

## 2019-03-24 LAB — OCCULT BLOOD X 1 CARD TO LAB, STOOL: Fecal Occult Bld: NEGATIVE

## 2019-03-24 LAB — HIV ANTIBODY (ROUTINE TESTING W REFLEX): HIV Screen 4th Generation wRfx: NONREACTIVE

## 2019-03-24 MED ORDER — AMLODIPINE BESYLATE 5 MG PO TABS: 5.0000 mg | ORAL_TABLET | Freq: Every day | ORAL | 0 refills | Status: AC

## 2019-03-24 MED ORDER — SULFAMETHOXAZOLE-TRIMETHOPRIM 400-80 MG PO TABS: 1.0000 | ORAL_TABLET | ORAL | Status: AC

## 2019-03-24 MED ORDER — MYCOPHENOLATE SODIUM 180 MG PO TBEC: 180.0000 mg | DELAYED_RELEASE_TABLET | Freq: Two times a day (BID) | ORAL | 0 refills | Status: AC

## 2019-03-24 NOTE — Progress Notes (Signed)
Pt for DC to home to be picked up by wife.  DC instructions given and reviewed.  Pt understands follow up appointments and medications.

## 2019-03-25 LAB — HEMOGLOBIN A1C
Hgb A1c MFr Bld: 8.8 % — ABNORMAL HIGH (ref 4.8–5.6)
Mean Plasma Glucose: 206 mg/dL

## 2019-03-25 LAB — PATHOLOGIST SMEAR REVIEW

## 2019-03-25 NOTE — Discharge Summary (Signed)
Physician Discharge Summary  [name] ZOX:[id number] DOB: 1946/03/18 DOA: 03/22/2019  PCP: Rogers Blocker, MD  Admit date: 03/22/2019 Discharge date: 03/24/2019  Admitted From:Home.  Disposition:  Home.   Recommendations for Outpatient Follow-up:  1. Follow up with PCP in 1-2 weeks 2. Please obtain BMP/CBC in one week Please follow up with nephrology in 1 to 2 weeks.   Discharge Condition:stable.  CODE STATUS:full code.  Diet recommendation: Heart Healthy  Brief/Interim Summary: [name] a 73 y.o.malewith medical history significant ofrenal failure status post kidney transplant 2016 with no hospitalizations since then 2 diabetes, hypercholesterolemia, prostate hypertrophy, right hip arthritis presents at the request of Dr. Raquel Sarna after blood work showed a hemoglobin to be 5.9. Went to see him and was complaining of 2 to 3 weeks of progressively worsening fatigue and dyspnea on exertion. Hemoglobin found to be 5.9 but patient also with pancytopenia white blood cell count of 2.7 and a platelet count of approximately 67.  Discharge Diagnoses:  Principal Problem:   Symptomatic anemia Active Problems:   HTN (hypertension)   Diabetes mellitus (HCC)   Proliferative diabetic retinopathy ([place])   History of kidney transplant   Pancytopenia (HCC)   Symptomatic anemia:  Differential include myfortic use vs anemia of blood loss from diverticulosis.  S/p 2 units of prbc transfusion, and repeat H&h is 7.4.  Negative  stool for occult blood. H&H is stable in the morning.  Pt denies using NSAIDS.    Essential hypertension;  Sub optimally controlled.  Restarted home meds and increased the norvasc to 5 mg daily.    Type 2 DM: Resume home meds.  Resume SSI.   H/O OF kidney transplant:  Resume prograf and decrease the dose of myfortic to 180 mg BID as per nephrology recommendations.  Decrease the use of bactrim to MWF.    Pancytopenia:  Probably  from bone marrow suppression from myfortic.  Platelet count stable around 60's.  No obvious signs of bleeding.  IF  Pancytopenia persists even after decreasing the dose of myfortic, recommend referral to hematology for further work up.  Discussed with the patient.     Discharge Instructions  Discharge Instructions    Diet - low sodium heart healthy   Complete by:  As directed    Discharge instructions   Complete by:  As directed    PLEASE FOLLOW UP WITH NEPHROLOGY AS RECOMMENDED.     Allergies as of 03/24/2019      Reactions   Codeine Nausea And Vomiting, Nausea Only   Doxycycline Nausea And Vomiting, Nausea Only   Latex Hives, Rash   Tape Other (See Comments)   The "plastic, waffle-printed" tape tears and bruises the skin      Medication List    TAKE these medications   acetaminophen 325 MG tablet Commonly known as:  TYLENOL Take 650 mg by mouth every 6 (six) hours as needed (for pain or headaches).   allopurinol 300 MG tablet Commonly known as:  ZYLOPRIM Take 300 mg by mouth as needed (as directed for gout flares).   amLODipine 5 MG tablet Commonly known as:  NORVASC Take 1 tablet (5 mg total) by mouth daily. What changed:    medication strength  how much to take   donepezil 10 MG tablet Commonly known as:  ARICEPT Take 10 mg by mouth at bedtime.   dorzolamide 2 % ophthalmic solution Commonly known as:  TRUSOPT Place 1 drop into both eyes 2 (two) times a day.  doxazosin 4 MG tablet Commonly known as:  CARDURA Take 4 mg by mouth 2 (two) times daily.   furosemide 40 MG tablet Commonly known as:  LASIX Take 40-80 mg by mouth See admin instructions. Take 80 mg by mouth in the morning and 40 mg in the evening   HumaLOG KwikPen 100 UNIT/ML KwikPen Generic drug:  insulin lispro Inject 8-16 Units into the skin See admin instructions. Inject 8-16 units into the skin three times a day before meals, PER SLIDING SCALE (BGL 150 or less, use nothing)    ketoconazole 2 % shampoo Commonly known as:  NIZORAL Apply 1 application topically every 7 (seven) days.   Klor-Con M20 20 MEQ tablet Generic drug:  potassium chloride SA Take 20 mEq by mouth daily.   Lantus SoloStar 100 UNIT/ML Solostar Pen Generic drug:  Insulin Glargine Inject 34 Units into the skin daily before breakfast.   latanoprost 0.005 % ophthalmic solution Commonly known as:  XALATAN Place 1 drop into both eyes at bedtime.   linagliptin 5 MG Tabs tablet Commonly known as:  TRADJENTA Take 5 mg by mouth daily with breakfast.   losartan 100 MG tablet Commonly known as:  COZAAR Take 100 mg by mouth daily.   metoprolol tartrate 25 MG tablet Commonly known as:  LOPRESSOR Take 25 mg by mouth 2 (two) times daily.   mycophenolate 180 MG EC tablet Commonly known as:  MYFORTIC Take 1 tablet (180 mg total) by mouth 2 (two) times daily. What changed:    medication strength  how much to take   NON FORMULARY Apply 1 application topically See admin instructions. Skin-So-Soft lotion: Apply as needed to dry sites of skin   One-A-Day Mens 50+ Advantage Tabs Take 1 tablet by mouth daily with breakfast.   Phospha 250 Neutral [id number] MG Tabs Take 1-2 tablets by mouth See admin instructions. Take 2 tablets by mouth in the morning and 1 tablet at bedtime at 8 PM   sodium chloride 0.65 % Soln nasal spray Commonly known as:  OCEAN Place 2 sprays into both nostrils as needed for congestion.   sulfamethoxazole-trimethoprim 400-80 MG tablet Commonly known as:  BACTRIM Take 1 tablet by mouth every Monday, Wednesday, and Friday. What changed:  when to take this   tacrolimus 1 MG capsule Commonly known as:  PROGRAF Take 3 mg by mouth 2 (two) times daily.   tacrolimus 0.5 MG capsule Commonly known as:  PROGRAF Take 0.5 mg by mouth 2 (two) times daily.   tadalafil 20 MG tablet Commonly known as:  CIALIS Take 20 mg by mouth daily as needed for erectile dysfunction.    Vitamin D (Ergocalciferol) 1.25 MG (50000 UT) Caps capsule Commonly known as:  DRISDOL Take 50,000 Units by mouth every 7 (seven) days.      Follow-up Information    Rogers Blocker, MD. Schedule an appointment as soon as possible for a visit in 1 week(s).   Specialty:  Internal Medicine Contact information: [address] [address] [phone]        Magrinat, Virgie Dad, MD. Schedule an appointment as soon as possible for a visit in 1 month(s).   Specialty:  Oncology Why:  Martyn Malay, IF PERSISTENT Contact information: [place] [address] [phone]          Allergies  Allergen Reactions  . Codeine Nausea And Vomiting and Nausea Only  . Doxycycline Nausea And Vomiting and Nausea Only  . Latex Hives  and Rash  . Tape Other (See Comments)    The "plastic, waffle-printed" tape tears and bruises the skin    Consultations:  Nephrology.    Procedures/Studies:  No results found.    Subjective: No new complaints.   Discharge Exam: Vitals:   03/23/19 2134 03/24/19 0523  BP: (!) 186/70 (!) 166/80  Pulse: 68 61  Resp: 19 18  Temp: 98.6 F (37 C) 98.3 F (36.8 C)  SpO2: 100% 100%   Vitals:   03/23/19 1459 03/23/19 2134 03/24/19 0500 03/24/19 0523  BP: (!) 171/79 (!) 186/70  (!) 166/80  Pulse: 63 68  61  Resp: 18 19  18   Temp: 98.3 F (36.8 C) 98.6 F (37 C)  98.3 F (36.8 C)  TempSrc: Oral Oral  Oral  SpO2: 100% 100%  100%  Weight:   99.2 kg   Height:        General: Pt is alert, awake, not in acute distress Cardiovascular: RRR, S1/S2 +, no rubs, no gallops Respiratory: CTA bilaterally, no wheezing, no rhonchi Abdominal: Soft, NT, ND, bowel sounds + Extremities: no edema, no cyanosis    The results of significant diagnostics from this hospitalization (including imaging, microbiology, ancillary and laboratory) are listed below for reference.     Microbiology: Recent Results (from the past  240 hour(s))  SARS Coronavirus 2 (CEPHEID - Performed in [facility] hospital lab), Hosp Order     Status: None   Collection Time: 03/22/19  4:09 PM  Result Value Ref Range Status   SARS Coronavirus 2 NEGATIVE NEGATIVE Final    Comment: (NOTE) If result is NEGATIVE SARS-CoV-2 target nucleic acids are NOT DETECTED. The SARS-CoV-2 RNA is generally detectable in upper and lower  respiratory specimens during the acute phase of infection. The lowest  concentration of SARS-CoV-2 viral copies this assay can detect is 250  copies / mL. A negative result does not preclude SARS-CoV-2 infection  and should not be used as the sole basis for treatment or other  patient management decisions.  A negative result may occur with  improper specimen collection / handling, submission of specimen other  than nasopharyngeal swab, presence of viral mutation(s) within the  areas targeted by this assay, and inadequate number of viral copies  (<250 copies / mL). A negative result must be combined with clinical  observations, patient history, and epidemiological information. If result is POSITIVE SARS-CoV-2 target nucleic acids are DETECTED. The SARS-CoV-2 RNA is generally detectable in upper and lower  respiratory specimens dur ing the acute phase of infection.  Positive  results are indicative of active infection with SARS-CoV-2.  Clinical  correlation with patient history and other diagnostic information is  necessary to determine patient infection status.  Positive results do  not rule out bacterial infection or co-infection with other viruses. If result is PRESUMPTIVE POSTIVE SARS-CoV-2 nucleic acids MAY BE PRESENT.   A presumptive positive result was obtained on the submitted specimen  and confirmed on repeat testing.  While 2019 novel coronavirus  (SARS-CoV-2) nucleic acids may be present in the submitted sample  additional confirmatory testing may be necessary for epidemiological  and / or clinical  management purposes  to differentiate between  SARS-CoV-2 and other Sarbecovirus currently known to infect humans.  If clinically indicated additional testing with an alternate test  methodology [phone]) is advised. The SARS-CoV-2 RNA is generally  detectable in upper and lower respiratory sp ecimens during the acute  phase of infection. The expected result is Negative.  Fact Sheet for Patients:  StrictlyIdeas.no Fact Sheet for Healthcare Providers: BankingDealers.co.za This test is not yet approved or cleared by the [place] FDA and has been authorized for detection and/or diagnosis of SARS-CoV-2 by FDA under an Emergency Use Authorization (EUA).  This EUA will remain in effect (meaning this test can be used) for the duration of the COVID-19 declaration under Section 564(b)(1) of the Act, 21 U.S.C. section 360bbb-3(b)(1), unless the authorization is terminated or revoked sooner. Performed at [facility], [place] [address] [place], [place] [address]      Labs: BNP (last 3 results) No results for input(s): BNP in the last 8760 hours. Basic Metabolic Panel: Recent Labs  Lab 03/22/19 1339 03/23/19 1256 03/24/19 0440  NA 141 140 141  K 3.9 4.1 3.4*  CL 112* 109 106  CO2 22 24 26   GLUCOSE 221* 167* 105*  BUN 12 10 12   CREATININE 1.49* 1.29* 1.26*  CALCIUM 10.2 9.9 9.9   Liver Function Tests: Recent Labs  Lab 03/22/19 1339  AST 19  ALT 13  ALKPHOS 66  BILITOT 1.6*  PROT 7.1  ALBUMIN 4.3   No results for input(s): LIPASE, AMYLASE in the last 168 hours. No results for input(s): AMMONIA in the last 168 hours. CBC: Recent Labs  Lab 03/22/19 1339 03/22/19 1500 03/23/19 0403 03/24/19 0440  WBC 2.7* 2.8* 3.6* 3.3*  NEUTROABS  --  1.0*  --   --   HGB 5.9* 5.9* 7.4* 7.8*  HCT 19.4* 19.1* 23.0* 23.9*  MCV 114.8* 112.4* 103.6* 101.7*  PLT 67* 68* 64* 66*   Cardiac Enzymes: No results for input(s): CKTOTAL,  CKMB, CKMBINDEX, TROPONINI in the last 168 hours. BNP: Invalid input(s): POCBNP CBG: Recent Labs  Lab 03/23/19 1154 03/23/19 1653 03/23/19 2131 03/24/19 0819 03/24/19 1154  GLUCAP 167* 139* 209* 156* 169*   D-Dimer No results for input(s): DDIMER in the last 72 hours. Hgb A1c Recent Labs    03/22/19 1903  HGBA1C 8.8*   Lipid Profile No results for input(s): CHOL, HDL, LDLCALC, TRIG, CHOLHDL, LDLDIRECT in the last 72 hours. Thyroid function studies No results for input(s): TSH, T4TOTAL, T3FREE, THYROIDAB in the last 72 hours.  Invalid input(s): FREET3 Anemia work up Recent Labs    03/23/19 1525  VITAMINB12 944*  FOLATE 13.2  FERRITIN 1,060*  TIBC 326  IRON 134  RETICCTPCT 2.8   Urinalysis    Component Value Date/Time   COLORURINE YELLOW 03/22/2019 1609   APPEARANCEUR CLEAR 03/22/2019 1609   LABSPEC 1.010 03/22/2019 1609   PHURINE 6.0 03/22/2019 1609   GLUCOSEU NEGATIVE 03/22/2019 1609   HGBUR NEGATIVE 03/22/2019 1609   [place] 03/22/2019 1609   [place] 03/22/2019 1609   PROTEINUR NEGATIVE 03/22/2019 1609   UROBILINOGEN 0.2 08/17/2012 1120   NITRITE NEGATIVE 03/22/2019 1609   LEUKOCYTESUR NEGATIVE 03/22/2019 1609   Sepsis Labs Invalid input(s): PROCALCITONIN,  WBC,  LACTICIDVEN Microbiology Recent Results (from the past 240 hour(s))  SARS Coronavirus 2 (CEPHEID - Performed in [facility] hospital lab), Hosp Order     Status: None   Collection Time: 03/22/19  4:09 PM  Result Value Ref Range Status   SARS Coronavirus 2 NEGATIVE NEGATIVE Final    Comment: (NOTE) If result is NEGATIVE SARS-CoV-2 target nucleic acids are NOT DETECTED. The SARS-CoV-2 RNA is generally detectable in upper and lower  respiratory specimens during the acute phase of infection. The lowest  concentration of SARS-CoV-2 viral copies this assay can detect is  250  copies / mL. A negative result does not preclude SARS-CoV-2 infection  and should not be used as  the sole basis for treatment or other  patient management decisions.  A negative result may occur with  improper specimen collection / handling, submission of specimen other  than nasopharyngeal swab, presence of viral mutation(s) within the  areas targeted by this assay, and inadequate number of viral copies  (<250 copies / mL). A negative result must be combined with clinical  observations, patient history, and epidemiological information. If result is POSITIVE SARS-CoV-2 target nucleic acids are DETECTED. The SARS-CoV-2 RNA is generally detectable in upper and lower  respiratory specimens dur ing the acute phase of infection.  Positive  results are indicative of active infection with SARS-CoV-2.  Clinical  correlation with patient history and other diagnostic information is  necessary to determine patient infection status.  Positive results do  not rule out bacterial infection or co-infection with other viruses. If result is PRESUMPTIVE POSTIVE SARS-CoV-2 nucleic acids MAY BE PRESENT.   A presumptive positive result was obtained on the submitted specimen  and confirmed on repeat testing.  While 2019 novel coronavirus  (SARS-CoV-2) nucleic acids may be present in the submitted sample  additional confirmatory testing may be necessary for epidemiological  and / or clinical management purposes  to differentiate between  SARS-CoV-2 and other Sarbecovirus currently known to infect humans.  If clinically indicated additional testing with an alternate test  methodology [phone]) is advised. The SARS-CoV-2 RNA is generally  detectable in upper and lower respiratory sp ecimens during the acute  phase of infection. The expected result is Negative. Fact Sheet for Patients:  StrictlyIdeas.no Fact Sheet for Healthcare Providers: BankingDealers.co.za This test is not yet approved or cleared by the [place] FDA and has been authorized for  detection and/or diagnosis of SARS-CoV-2 by FDA under an Emergency Use Authorization (EUA).  This EUA will remain in effect (meaning this test can be used) for the duration of the COVID-19 declaration under Section 564(b)(1) of the Act, 21 U.S.C. section 360bbb-3(b)(1), unless the authorization is terminated or revoked sooner. Performed at [facility], [place] [address] [place], [place] [address]      Time coordinating discharge: 32  minutes  SIGNED:   Hosie Poisson, MD  [facility] 03/25/2019, 6:20 PM Pager   If 7PM-7AM, please contact night-coverage [email or web address] Password [email or web address]

## 2019-03-28 DIAGNOSIS — H35373 Puckering of macula, bilateral: Secondary | ICD-10-CM | POA: Insufficient documentation

## 2019-03-28 DIAGNOSIS — H3582 Retinal ischemia: Secondary | ICD-10-CM | POA: Insufficient documentation

## 2019-03-31 LAB — PATHOLOGIST SMEAR REVIEW

## 2019-04-05 ENCOUNTER — Other Ambulatory Visit: Payer: Self-pay

## 2019-04-05 ENCOUNTER — Ambulatory Visit (HOSPITAL_COMMUNITY)
Admission: RE | Admit: 2019-04-05 | Discharge: 2019-04-05 | Disposition: A | Discharge status: Home / Self Care | Payer: Medicare Other | Source: Ambulatory Visit | Attending: Nephrology | Admitting: Nephrology

## 2019-04-05 DIAGNOSIS — D631 Anemia in chronic kidney disease: Secondary | ICD-10-CM | POA: Insufficient documentation

## 2019-04-05 LAB — PREPARE RBC (CROSSMATCH)

## 2019-04-08 ENCOUNTER — Ambulatory Visit (HOSPITAL_COMMUNITY)
Admission: RE | Admit: 2019-04-08 | Discharge: 2019-04-08 | Disposition: A | Discharge status: Home / Self Care | Payer: Medicare Other | Source: Ambulatory Visit | Attending: Nephrology | Admitting: Nephrology

## 2019-04-08 ENCOUNTER — Other Ambulatory Visit: Payer: Self-pay

## 2019-04-08 DIAGNOSIS — N189 Chronic kidney disease, unspecified: Secondary | ICD-10-CM | POA: Diagnosis not present

## 2019-04-08 DIAGNOSIS — D631 Anemia in chronic kidney disease: Secondary | ICD-10-CM | POA: Insufficient documentation

## 2019-04-08 MED ORDER — SODIUM CHLORIDE 0.9% IV SOLUTION: Freq: Once | INTRAVENOUS | Status: DC

## 2019-04-09 LAB — TYPE AND SCREEN
ABO/RH(D): A POS
Antibody Screen: NEGATIVE
Unit division: 0
Unit division: 0

## 2019-04-09 LAB — BPAM RBC
Blood Product Expiration Date: 202007102359
Blood Product Expiration Date: 202007102359
ISSUE DATE / TIME: 202006220847
ISSUE DATE / TIME: 202006221133
Unit Type and Rh: 6200
Unit Type and Rh: 6200

## 2019-04-10 ENCOUNTER — Other Ambulatory Visit: Payer: Self-pay | Admitting: Oncology

## 2019-04-10 DIAGNOSIS — D61818 Other pancytopenia: Secondary | ICD-10-CM

## 2019-04-12 ENCOUNTER — Telehealth: Payer: Self-pay | Admitting: Oncology

## 2019-04-12 NOTE — Telephone Encounter (Signed)
Scheduled appt per 6/24 sch message- pt aware of appt date and time

## 2019-04-15 ENCOUNTER — Other Ambulatory Visit: Payer: Self-pay

## 2019-04-15 ENCOUNTER — Ambulatory Visit (HOSPITAL_COMMUNITY)
Admission: EM | Admit: 2019-04-15 | Discharge: 2019-04-15 | Disposition: A | Discharge status: Home / Self Care | Payer: Medicare Other | Attending: Family Medicine | Admitting: Family Medicine

## 2019-04-15 ENCOUNTER — Encounter (HOSPITAL_COMMUNITY): Payer: Self-pay | Admitting: Emergency Medicine

## 2019-04-15 DIAGNOSIS — S0091XA Abrasion of unspecified part of head, initial encounter: Secondary | ICD-10-CM | POA: Diagnosis not present

## 2019-04-15 MED ORDER — MUPIROCIN CALCIUM 2 % EX CREA: 1.0000 "application " | TOPICAL_CREAM | Freq: Two times a day (BID) | CUTANEOUS | 0 refills | Status: AC

## 2019-04-15 NOTE — ED Provider Notes (Signed)
[facility]    CSN: [id number] Arrival date & time: 04/15/19  1324     History   Chief Complaint Chief Complaint  Patient presents with  . Wound Check    HPI Alontae Chaloux is a 73 y.o. male with history of type 2 diabetes, symptomatic anemia, pancytopenia, and end-stage renal disease status post right kidney transplant on chronic immunosuppression therapy presenting for wound check.  Patient states that he was walking out of his "out showed "when he tripped and caught himself next week.  States that he grazed the top of his head against the door frame.  Patient denies foreign body, was able to clean his wound thoroughly at home with the help of his wife.  States that he has been using hydroperoxide and Neosporin and keeping it open, clean, dry since.  Patient states his wife was concerned that the wound was not healing and appropriate time, want him to get checked out.  Patient denies pain, fever, headaches, arthralgias, myalgias.  No active drainage/discharge or bleeding.   Past Medical History:  Diagnosis Date  . Anemia    hx of at beginnning of dialysis- 05/2009  . Arthritis    right hip   . End stage renal disease ([place])    [place]   . End stage renal disease on dialysis ([place])   . Essential hypertension, malignant   . Hypertrophy of prostate without urinary obstruction and other lower urinary tract symptoms (LUTS)   . Pure hypercholesterolemia   . Renal insufficiency   . Type II or unspecified type diabetes mellitus without mention of complication, not stated as uncontrolled    TYPE 2     Patient Active Problem List   Diagnosis Date Noted  . Symptomatic anemia 03/22/2019  . History of kidney transplant 03/22/2019  . Pancytopenia ([place]) 03/22/2019  . Posterior capsular opacification of right eye, obscuring vision 09/13/2017  . Diplopia 09/16/2016  . Postsurgical states following surgery of eye and adnexa 07/06/2016  .  Primary open-angle glaucoma 06/08/2016  . Pseudophakia of both eyes 06/08/2016  . Dislocated IOL (intraocular lens), posterior 01/29/2015  . Proliferative diabetic retinopathy ([place]) 01/29/2015  . Complicated UTI (urinary tract infection) 10/01/2012  . Hydronephrosis of right kidney 10/01/2012  . End stage renal disease ([place]) 10/01/2012  . HTN (hypertension) 10/01/2012  . Diabetes mellitus ([place]) 10/01/2012  . Anemia 10/01/2012    Past Surgical History:  Procedure Laterality Date  . A/V FISTULAGRAM Left 06/12/2017   Procedure: A/V Fistulagram;  Surgeon: Algernon Huxley, MD;  Location: [facility];  Service: Cardiovascular;  Laterality: Left;  . CARDIAC CATHETERIZATION  04/29/10   normal coronaries, LVEF 45%, mild global LV hypokinesis ([place])  . CATARACT EXTRACTION W/PHACO Right 04/03/2013   Procedure: CATARACT EXTRACTION PHACO AND INTRAOCULAR LENS PLACEMENT (IOC);  Surgeon: Adonis Brook, MD;  Location: [facility];  Service: Ophthalmology;  Laterality: Right;  . CERVICAL DISCECTOMY  03/1999  . COLONOSCOPY     Hx: of  . CYSTOSCOPY WITH BIOPSY  11/01/2012   Procedure: CYSTOSCOPY WITH BIOPSY;  Surgeon: Molli Hazard, MD;  Location: [facility];  Service: Urology;  Laterality: N/A;  with fulgeration  . CYSTOSCOPY WITH RETROGRADE PYELOGRAM, URETEROSCOPY AND STENT PLACEMENT  11/01/2012   Procedure: CYSTOSCOPY WITH RETROGRADE PYELOGRAM, URETEROSCOPY AND STENT PLACEMENT;  Surgeon: Molli Hazard, MD;  Location: [facility];  Service: Urology;  Laterality: Bilateral;  . DIALYSIS FISTULA CREATION  2010  . EYE SURGERY  2012   cataracts  . EYE SURGERY  11/2014  . HOLMIUM LASER APPLICATION  01/07/5572   Procedure: HOLMIUM LASER APPLICATION;  Surgeon: Molli Hazard, MD;  Location: [facility];  Service: Urology;  Laterality: Bilateral;  . KIDNEY TRANSPLANT  2016  . MEMBRANE PEEL Right 09/04/2013   Procedure: MEMBRANE PEEL;  Surgeon: Adonis Brook, MD;  Location: [facility];  Service:  Ophthalmology;  Laterality: Right;  . NECK SURGERY  1990  . PARS PLANA VITRECTOMY Right 09/04/2013   Procedure: PARS PLANA VITRECTOMY WITH 23 GAUGE;  Surgeon: Adonis Brook, MD;  Location: [facility];  Service: Ophthalmology;  Laterality: Right;  . PARS PLANA VITRECTOMY Left 02/17/2015  . PARS PLANA VITRECTOMY Left 02/17/2015   Procedure: PARS PLANA VITRECTOMY WITH 25G REMOVAL/SUTURE INTRAOCULAR LENS;  Surgeon: Hayden Pedro, MD;  Location: [facility];  Service: Ophthalmology;  Laterality: Left;  . PERIPHERAL VASCULAR CATHETERIZATION Left 08/08/2016   Procedure: A/V Shuntogram/Fistulagram;  Surgeon: Algernon Huxley, MD;  Location: [facility];  Service: Cardiovascular;  Laterality: Left;  . PERIPHERAL VASCULAR CATHETERIZATION N/A 08/08/2016   Procedure: A/V Shunt Intervention;  Surgeon: Algernon Huxley, MD;  Location: [facility];  Service: Cardiovascular;  Laterality: N/A;  . PHOTOCOAGULATION WITH LASER Right 09/04/2013   Procedure: PHOTOCOAGULATION WITH LASER;  Surgeon: Adonis Brook, MD;  Location: [facility];  Service: Ophthalmology;  Laterality: Right;  ENDOLASER  . TRANSTHORACIC ECHOCARDIOGRAM  06/19/2012   normal LV sys function, EF 55-60%, mild to mod diastolic dysfunction, mild AI ([place])       Home Medications    Prior to Admission medications   Medication Sig Start Date End Date Taking? Authorizing Provider  acetaminophen (TYLENOL) 325 MG tablet Take 650 mg by mouth every 6 (six) hours as needed (for pain or headaches).     [provider]  allopurinol (ZYLOPRIM) 300 MG tablet Take 300 mg by mouth as needed (as directed for gout flares).     [provider]  amLODipine (NORVASC) 5 MG tablet Take 1 tablet (5 mg total) by mouth daily. 03/25/19   Hosie Poisson, MD  donepezil (ARICEPT) 10 MG tablet Take 10 mg by mouth at bedtime.    [provider]  dorzolamide (TRUSOPT) 2 % ophthalmic solution Place 1 drop into both eyes 2 (two) times a day.  12/06/18    [provider]  doxazosin (CARDURA) 4 MG tablet Take 4 mg by mouth 2 (two) times daily. 02/28/19   [provider]  furosemide (LASIX) 40 MG tablet Take 40-80 mg by mouth See admin instructions. Take 80 mg by mouth in the morning and 40 mg in the evening    [provider]  HUMALOG KWIKPEN 100 UNIT/ML KiwkPen Inject 8-16 Units into the skin See admin instructions. Inject 8-16 units into the skin three times a day before meals, PER SLIDING SCALE (BGL 150 or less, use nothing) 04/29/18   [provider]  Insulin Glargine (LANTUS SOLOSTAR) 100 UNIT/ML Solostar Pen Inject 34 Units into the skin daily before breakfast.  05/19/16   [provider]  K Phos Mono-Sod Phos Di & Mono (PHOSPHA 250 NEUTRAL) 220-254-270 MG TABS Take 1-2 tablets by mouth See admin instructions. Take 2 tablets by mouth in the morning and 1 tablet at bedtime at 8 PM 02/01/19   [provider]  ketoconazole (NIZORAL) 2 % shampoo Apply 1 application topically every 7 (seven) days.  04/16/18   [provider]  KLOR-CON M20 20 [place]  tablet Take 20 mEq by mouth daily. 05/10/18   [provider]  latanoprost (XALATAN) 0.005 % ophthalmic solution Place 1 drop into both eyes at bedtime.     [provider]  linagliptin (TRADJENTA) 5 MG TABS tablet Take 5 mg by mouth daily with breakfast.     [provider]  losartan (COZAAR) 100 MG tablet Take 100 mg by mouth daily.    [provider]  metoprolol tartrate (LOPRESSOR) 25 MG tablet Take 25 mg by mouth 2 (two) times daily.    [provider]  Multiple Vitamins-Minerals (ONE-A-DAY MENS 50+ ADVANTAGE) TABS Take 1 tablet by mouth daily with breakfast.    [provider]  mupirocin cream (BACTROBAN) 2 % Apply 1 application topically 2 (two) times daily. 04/15/19   Hall-Potvin, [place], PA-C  mycophenolate (MYFORTIC) 180 MG EC tablet Take 1 tablet (180 mg total) by mouth 2 (two) times daily.  03/24/19   Hosie Poisson, MD  NON FORMULARY Apply 1 application topically See admin instructions. Skin-So-Soft lotion: Apply as needed to dry sites of skin    [provider]  sodium chloride (OCEAN) 0.65 % SOLN nasal spray Place 2 sprays into both nostrils as needed for congestion.     [provider]  sulfamethoxazole-trimethoprim (BACTRIM) 400-80 MG tablet Take 1 tablet by mouth every Monday, Wednesday, and Friday. 03/25/19   Hosie Poisson, MD  tacrolimus (PROGRAF) 0.5 MG capsule Take 0.5 mg by mouth 2 (two) times daily. 02/28/19   [provider]  tacrolimus (PROGRAF) 1 MG capsule Take 3 mg by mouth 2 (two) times daily.  06/04/18   [provider]  tadalafil (CIALIS) 20 MG tablet Take 20 mg by mouth daily as needed for erectile dysfunction.     [provider]  Vitamin D, Ergocalciferol, (DRISDOL) 50000 units CAPS capsule Take 50,000 Units by mouth every 7 (seven) days.  03/21/16   [provider]    Family History Family History  Problem Relation Age of Onset  . Breast cancer Mother 81       deceased  . Pneumonia Father 49       deceased  . Hypertension Brother   . Diabetes Mellitus II Brother     Social History Social History   Tobacco Use  . Smoking status: Former Smoker    Types: Cigarettes    Quit date: 10/02/1990    Years since quitting: 28.5  . Smokeless tobacco: Never Used  Substance Use Topics  . Alcohol use: Yes    Comment: occasional  . Drug use: No     Allergies   Codeine, Doxycycline, Latex, and Tape   Review of Systems As per HPI   Physical Exam Triage Vital Signs ED Triage Vitals  Enc Vitals Group     BP 04/15/19 1351 (!) 151/73     Pulse Rate 04/15/19 1351 75     Resp 04/15/19 1351 18     Temp 04/15/19 1351 (!) 97.2 F (36.2 C)     Temp Source 04/15/19 1351 Oral     SpO2 04/15/19 1351 95 %     Weight --      Height --      Head Circumference --      Peak Flow --      Pain Score 04/15/19 1352  0     Pain Loc --      Pain Edu? --      Excl. in [place]? --    No data  found.  Updated Vital Signs BP (!) 151/73 (BP Location: Right Arm)   Pulse 75   Temp (!) 97.2 F (36.2 C) (Oral)   Resp 18   SpO2 95%   Visual Acuity Right Eye Distance:   Left Eye Distance:   Bilateral Distance:    Right Eye Near:   Left Eye Near:    Bilateral Near:     Physical Exam Constitutional:      General: He is not in acute distress. HENT:     Head: Normocephalic and atraumatic.  Eyes:     General: No scleral icterus.    Pupils: Pupils are equal, round, and reactive to light.  Cardiovascular:     Rate and Rhythm: Normal rate.  Pulmonary:     Effort: Pulmonary effort is normal.  Skin:    Coloration: Skin is not jaundiced or pale.     Comments: Two small and one large (1.5 cm) superficial and resolving abrasions on right aspect of scalp.  No warmth, surrounding erythema, active discharge.  Good granulation tissue with scab.    Neurological:     Mental Status: He is alert and oriented to person, place, and time.      UC Treatments / Results  Labs (all labs ordered are listed, but only abnormal results are displayed) Labs Reviewed - No data to display  EKG None  Radiology No results found.  Procedures Procedures (including critical care time)  Medications Ordered in UC Medications - No data to display  Initial Impression / Assessment and Plan / UC Course  I have reviewed the triage vital signs and the nursing notes.  Pertinent labs & imaging results that were available during my care of the patient were reviewed by me and considered in my medical decision making (see chart for details).     73 year old male with history of type 2 diabetes, symptomatic anemia, pancytopenia, end-stage renal disease status post right renal transplant presenting for wound check of scalp.  History and physical reassuring, will use mupirocin cream as adjuvant therapy to patient's current clean  regimen given high risk of infection.  Return precautions discussed, patient verbalized understanding. Final Clinical Impressions(s) / UC Diagnoses   Final diagnoses:  Abrasion of head, initial encounter     Discharge Instructions     Use mupirocin cream 2 times daily. Keep area clean and dry. Return if you develop bigger bump, more pain, pus, fever.    ED Prescriptions    Medication Sig Dispense Auth. Provider   mupirocin cream (BACTROBAN) 2 % Apply 1 application topically 2 (two) times daily. 15 g Hall-Potvin, [place], PA-C     Controlled Substance Prescriptions  Controlled Substance Registry consulted? Not Applicable   Quincy Sheehan, [place] 04/15/19 1428

## 2019-04-15 NOTE — ED Triage Notes (Signed)
Pt here for wound check to top of head; pt sts 1 week ago hit at home and has been cleaning wound

## 2019-04-15 NOTE — Discharge Instructions (Signed)
Use mupirocin cream 2 times daily. Keep area clean and dry. Return if you develop bigger bump, more pain, pus, fever.

## 2019-04-22 ENCOUNTER — Inpatient Hospital Stay: Payer: Medicare Other | Attending: Oncology | Admitting: Oncology

## 2019-04-22 ENCOUNTER — Encounter: Payer: Self-pay | Admitting: Oncology

## 2019-04-22 ENCOUNTER — Other Ambulatory Visit: Payer: Self-pay

## 2019-04-22 VITALS — BP 162/54 | HR 70 | Temp 98.5°F | Resp 18 | Ht 72.0 in | Wt 218.8 lb

## 2019-04-22 DIAGNOSIS — N186 End stage renal disease: Secondary | ICD-10-CM | POA: Diagnosis not present

## 2019-04-22 DIAGNOSIS — Z940 Kidney transplant status: Secondary | ICD-10-CM | POA: Diagnosis not present

## 2019-04-22 DIAGNOSIS — L989 Disorder of the skin and subcutaneous tissue, unspecified: Secondary | ICD-10-CM | POA: Insufficient documentation

## 2019-04-22 DIAGNOSIS — E11319 Type 2 diabetes mellitus with unspecified diabetic retinopathy without macular edema: Secondary | ICD-10-CM | POA: Insufficient documentation

## 2019-04-22 DIAGNOSIS — Z992 Dependence on renal dialysis: Secondary | ICD-10-CM | POA: Insufficient documentation

## 2019-04-22 DIAGNOSIS — Z87891 Personal history of nicotine dependence: Secondary | ICD-10-CM | POA: Diagnosis not present

## 2019-04-22 DIAGNOSIS — D696 Thrombocytopenia, unspecified: Secondary | ICD-10-CM | POA: Diagnosis not present

## 2019-04-22 DIAGNOSIS — D61818 Other pancytopenia: Secondary | ICD-10-CM | POA: Diagnosis not present

## 2019-04-22 DIAGNOSIS — Z574 Occupational exposure to toxic agents in agriculture: Secondary | ICD-10-CM | POA: Diagnosis not present

## 2019-04-22 DIAGNOSIS — N1330 Unspecified hydronephrosis: Secondary | ICD-10-CM

## 2019-04-22 DIAGNOSIS — Z800 Family history of malignant neoplasm of digestive organs: Secondary | ICD-10-CM | POA: Insufficient documentation

## 2019-04-22 DIAGNOSIS — E1122 Type 2 diabetes mellitus with diabetic chronic kidney disease: Secondary | ICD-10-CM | POA: Insufficient documentation

## 2019-04-22 DIAGNOSIS — Z79899 Other long term (current) drug therapy: Secondary | ICD-10-CM | POA: Insufficient documentation

## 2019-04-22 DIAGNOSIS — D649 Anemia, unspecified: Secondary | ICD-10-CM

## 2019-04-22 DIAGNOSIS — Z803 Family history of malignant neoplasm of breast: Secondary | ICD-10-CM | POA: Insufficient documentation

## 2019-04-22 DIAGNOSIS — I120 Hypertensive chronic kidney disease with stage 5 chronic kidney disease or end stage renal disease: Secondary | ICD-10-CM | POA: Diagnosis not present

## 2019-04-22 DIAGNOSIS — E083519 Diabetes mellitus due to underlying condition with proliferative diabetic retinopathy with macular edema, unspecified eye: Secondary | ICD-10-CM

## 2019-04-22 DIAGNOSIS — E083219 Diabetes mellitus due to underlying condition with mild nonproliferative diabetic retinopathy with macular edema, unspecified eye: Secondary | ICD-10-CM | POA: Diagnosis not present

## 2019-04-22 DIAGNOSIS — D7589 Other specified diseases of blood and blood-forming organs: Secondary | ICD-10-CM | POA: Diagnosis not present

## 2019-04-22 DIAGNOSIS — D702 Other drug-induced agranulocytosis: Secondary | ICD-10-CM

## 2019-04-22 DIAGNOSIS — I10 Essential (primary) hypertension: Secondary | ICD-10-CM

## 2019-04-22 NOTE — Progress Notes (Signed)
[name]  [phone] [phone]    ID: [name] DOB: 1946-04-15  MR#: [id number]  CHE#:[id number]  Patient Care Team: Rogers Blocker, MD as PCP - General (Internal Medicine) , Virgie Dad, MD as Consulting Physician (Oncology) Donato Heinz, MD as Consulting Physician (Nephrology) Ander Slade, Carlisle Beers, MD as Referring Physician (Ophthalmology) [name] Erskine Squibb, MD as Referring Physician (Vascular Surgery) OTHER MD:   CHIEF COMPLAINT:  Pancytopenia, symptomatic anemia  CURRENT TREATMENT: Work-up in progress   HISTORY OF CURRENT ILLNESS: [name] is a 73 year old [place] man with a history of type 2 diabetes complicated by significant retinopathy and end-stage renal disease, status post renal transplant, followed by Dr. Marval Regal.  Rhoads notes that he was feeling more fatigued than usual, and that last time he felt this way, his Vitamin D levels were low. He called his doctors office to ask for his blood work to be ran before he was sent to the ED by Dr. Marlou Sa. The patient presented to the emergency room 03/22/2019 with symptomatic anemia, was found to have hemoglobin of 5.9 and pancytopenia.  Additional work-up is listed below in the assessment section, but there was no evidence of B12, folate or iron deficiency and reticulocyte count was inappropriately normal.  HIV and SARS coronavirus 2 tests were negative.  Creatinine was 1.49 with GFR was 54.  Glucose was 221.  Other labs of interest obtained 03/23/2019 included a B12 level of 944, folate 13.2, ferritin 1060, with serum iron 134, saturation 41%.  The absolute reticulocyte count was 64.5.  His end-stage renal disease is felt to be due to diabetes and hypertension.  He was on dialysis starting 2010 but on 03/31/2015 he underwent a cadaveric renal transplant at [facility].  He had transient CMV viremia, which was treated.  He continues on Bactrim prophylaxis  and tacrolimus.  He also receives mycophenolate and when he had the episode of severe anemia and a mycophenolate was decreased in half and his Bactrim was decreased to Monday Wednesday Friday.  His tacrolimus level was on the high side.  This has been since adjusted by Dr. Marval Regal and is being repeated today  The patient was transfused and discharged and referred here for evaluation of pancytopenia  INTERVAL HISTORY: The patient was evaluated in the hematology clinic on 04/22/2019.   REVIEW OF SYSTEMS: [name] is not aware of any recent infection or bleeding.  Denies unusual headaches, visual changes, nausea, vomiting, stiff neck, dizziness, or gait imbalance. There has been no cough, phlegm production, or pleurisy, no chest pain or pressure, and no change in bowel or bladder habits. The patient denies fever, rash, or unexplained weight loss.  He exercises chiefly by doing some gardening.  Since his dog died 3 years ago he is not walking regularly.  A detailed review of systems was otherwise entirely negative.   PAST MEDICAL HISTORY: Past Medical History:  Diagnosis Date  . Anemia    hx of at beginnning of dialysis- 05/2009  . Arthritis    right hip   . End stage renal disease ([place])    [place]   . End stage renal disease on dialysis ([place])   . Essential hypertension, malignant   . Hypertrophy of prostate without urinary obstruction and other lower urinary tract symptoms (LUTS)   . Pure hypercholesterolemia   . Renal insufficiency   . Type II or unspecified type diabetes mellitus without mention of complication, not stated  as uncontrolled    TYPE 2      PAST SURGICAL HISTORY: Past Surgical History:  Procedure Laterality Date  . A/V FISTULAGRAM Left 06/12/2017   Procedure: A/V Fistulagram;  Surgeon: Algernon Huxley, MD;  Location: [facility];  Service: Cardiovascular;  Laterality: Left;  . CARDIAC CATHETERIZATION  04/29/10   normal  coronaries, LVEF 45%, mild global LV hypokinesis ([place])  . CATARACT EXTRACTION W/PHACO Right 04/03/2013   Procedure: CATARACT EXTRACTION PHACO AND INTRAOCULAR LENS PLACEMENT (IOC);  Surgeon: Adonis Brook, MD;  Location: [facility];  Service: Ophthalmology;  Laterality: Right;  . CERVICAL DISCECTOMY  03/1999  . COLONOSCOPY     Hx: of  . CYSTOSCOPY WITH BIOPSY  11/01/2012   Procedure: CYSTOSCOPY WITH BIOPSY;  Surgeon: Molli Hazard, MD;  Location: [facility];  Service: Urology;  Laterality: N/A;  with fulgeration  . CYSTOSCOPY WITH RETROGRADE PYELOGRAM, URETEROSCOPY AND STENT PLACEMENT  11/01/2012   Procedure: CYSTOSCOPY WITH RETROGRADE PYELOGRAM, URETEROSCOPY AND STENT PLACEMENT;  Surgeon: Molli Hazard, MD;  [facility];  Service: Urology;  Laterality: Bilateral;  . DIALYSIS FISTULA CREATION  2010  . EYE SURGERY  2012   cataracts  . EYE SURGERY  11/2014  . HOLMIUM LASER APPLICATION  6/81/1572   Procedure: HOLMIUM LASER APPLICATION;  Surgeon: Molli Hazard, MD;  [facility];  Service: Urology;  Laterality: Bilateral;  . KIDNEY TRANSPLANT  2016  . MEMBRANE PEEL Right 09/04/2013   Procedure: MEMBRANE PEEL;  Surgeon: Adonis Brook, MD;  Location: [facility];  Service: Ophthalmology;  Laterality: Right;  . NECK SURGERY  1990  . PARS PLANA VITRECTOMY Right 09/04/2013   Procedure: PARS PLANA VITRECTOMY WITH 23 GAUGE;  Surgeon: Adonis Brook, MD;  Location: [facility];  Service: Ophthalmology;  Laterality: Right;  . PARS PLANA VITRECTOMY Left 02/17/2015  . PARS PLANA VITRECTOMY Left 02/17/2015   Procedure: PARS PLANA VITRECTOMY WITH 25G REMOVAL/SUTURE INTRAOCULAR LENS;  Surgeon: Hay[place] Pedro, MD;  Location: [facility];  Service: Ophthalmology;  Laterality: Left;  . PERIPHERAL VASCULAR CATHETERIZATION Left 08/08/2016   Procedure: A/V Shuntogram/Fistulagram;  Surgeon: Algernon Huxley, MD;  Location: [facility];  Service: Cardiovascular;  Laterality: Left;  . PERIPHERAL  VASCULAR CATHETERIZATION N/A 08/08/2016   Procedure: A/V Shunt Intervention;  Surgeon: Algernon Huxley, MD;  Location: [facility];  Service: Cardiovascular;  Laterality: N/A;  . PHOTOCOAGULATION WITH LASER Right 09/04/2013   Procedure: PHOTOCOAGULATION WITH LASER;  Surgeon: Adonis Brook, MD;  Location: [facility];  Service: Ophthalmology;  Laterality: Right;  ENDOLASER  . TRANSTHORACIC ECHOCARDIOGRAM  06/19/2012   normal LV sys function, EF 55-60%, mild to mod diastolic dysfunction, mild AI ([place])  tonsillectomy @ 73 years of age   FAMILY HISTORY: Family History  Problem Relation Age of Onset  . Breast cancer Mother 34       deceased  . Pneumonia Father 12       deceased  . Hypertension Brother   . Diabetes Mellitus II Brother    The patient's s father died from pneumonia at age 72.  He was originally a Psychologist, sport and exercise and later worked in a Designer, television/film set.  Patients' mother died from breast cancer at age 74. The patient has 7 siblings. Patient denies anyone in her family having breast, ovarian, prostate, or pancreatic cancer.  1 sister died from intestinal cancer.  Another sister, Katharine Look, died from an aneurism.     SOCIAL HISTORY: (Current as of 04/22/2019) Mr. Paulsen retired  from the [employer] office. 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 He notes that he was exposed to agent orange. His wife, Swoveland, is a retired Corporate treasurer. They have two children, Lennette Bihari and Lauren. Lennette Bihari lives in [place] and works at the [employer] for [employer]. Lauren lives in [place] and teaches handicapped children for the [place] school [employer].Kennith Center has two grandchildren.  His daughter is expecting in August 2020. The patient he belongs to [employer] on [place]: In the absence of any documentation, Purvis's spouse is automatically her healthcare power of attorney.     HEALTH MAINTENANCE: Social History   Tobacco Use  . Smoking status: Former  Smoker    Types: Cigarettes    Quit date: 10/02/1990    Years since quitting: 28.5  . Smokeless tobacco: Never Used  Substance Use Topics  . Alcohol use: Yes    Comment: occasional  . Drug use: No    Colonoscopy: up to date/ [facility]  Allergies  Allergen Reactions  . Codeine Nausea And Vomiting and Nausea Only  . Doxycycline Nausea And Vomiting and Nausea Only  . Latex Hives and Rash  . Tape Other (See Comments)    The "plastic, waffle-printed" tape tears and bruises the skin    Current Outpatient Medications  Medication Sig Dispense Refill  . acetaminophen (TYLENOL) 325 MG tablet Take 650 mg by mouth every 6 (six) hours as needed (for pain or headaches).     [name] allopurinol (ZYLOPRIM) 300 MG tablet Take 300 mg by mouth as needed (as directed for gout flares).     [name] amLODipine (NORVASC) 5 MG tablet Take 1 tablet (5 mg total) by mouth daily. 30 tablet 0  . donepezil (ARICEPT) 10 MG tablet Take 10 mg by mouth at bedtime.    . dorzolamide (TRUSOPT) 2 % ophthalmic solution Place 1 drop into both eyes 2 (two) times a day.     . doxazosin (CARDURA) 4 MG tablet Take 4 mg by mouth 2 (two) times daily.    . furosemide (LASIX) 40 MG tablet Take 40-80 mg by mouth See admin instructions. Take 80 mg by mouth in the morning and 40 mg in the evening    . HUMALOG KWIKPEN 100 UNIT/ML KiwkPen Inject 8-16 Units into the skin See admin instructions. Inject 8-16 units into the skin three times a day before meals, PER SLIDING SCALE (BGL 150 or less, use nothing)  3  . Insulin Glargine (LANTUS SOLOSTAR) 100 UNIT/ML Solostar Pen Inject 34 Units into the skin daily before breakfast.     . K Phos Mono-Sod Phos Di & Mono (PHOSPHA 250 NEUTRAL) [phone] MG TABS Take 1-2 tablets by mouth See admin instructions. Take 2 tablets by mouth in the morning and 1 tablet at bedtime at 8 PM    . ketoconazole (NIZORAL) 2 % shampoo Apply 1 application topically every 7 (seven) days.   2  . KLOR-CON M20 20 MEQ tablet Take 20  mEq by mouth daily.  1  . latanoprost (XALATAN) 0.005 % ophthalmic solution Place 1 drop into both eyes at bedtime.     [name] losartan (COZAAR) 100 MG tablet Take 100 mg by mouth daily.    . metoprolol tartrate (LOPRESSOR) 25 MG tablet Take 25 mg by mouth 2 (two) times daily.    . Multiple Vitamins-Minerals (ONE-A-DAY MENS 50+ ADVANTAGE) TABS Take 1 tablet by mouth daily with breakfast.    . mupirocin  cream (BACTROBAN) 2 % Apply 1 application topically 2 (two) times daily. 15 g 0  . mycophenolate (MYFORTIC) 180 MG EC tablet Take 1 tablet (180 mg total) by mouth 2 (two) times daily. 60 tablet 0  . NON FORMULARY Apply 1 application topically See admin instructions. Skin-So-Soft lotion: Apply as needed to dry sites of skin    . sodium chloride (OCEAN) 0.65 % SOLN nasal spray Place 2 sprays into both nostrils as needed for congestion.     . sulfamethoxazole-trimethoprim (BACTRIM) 400-80 MG tablet Take 1 tablet by mouth every Monday, Wednesday, and Friday.    . tacrolimus (PROGRAF) 0.5 MG capsule Take 0.5 mg by mouth 2 (two) times daily.    . tacrolimus (PROGRAF) 1 MG capsule Take 3 mg by mouth 2 (two) times daily.   11  . tadalafil (CIALIS) 20 MG tablet Take 20 mg by mouth daily as needed for erectile dysfunction.     . Vitamin D, Ergocalciferol, (DRISDOL) 50000 units CAPS capsule Take 50,000 Units by mouth every 7 (seven) days.     [name] linagliptin (TRADJENTA) 5 MG TABS tablet Take 5 mg by mouth daily with breakfast.      No current facility-administered medications for this visit.      OBJECTIVE: Older African-American man in no acute distress  Vitals:   04/22/19 1613  BP: (!) 162/54  Pulse: 70  Resp: 18  Temp: 98.5 F (36.9 C)  SpO2: 100%     Body mass index is 29.67 kg/m.   Wt Readings from Last 3 Encounters:  04/22/19 218 lb 12.8 oz (99.2 kg)  04/08/19 221 lb (100.2 kg)  03/24/19 218 lb 11.1 oz (99.2 kg)      ECOG FS:1 - Symptomatic but completely ambulatory  Ocular: Sclerae  unicteric, pupils round and equal Ear-nose-throat: Oropharynx clear and moist Lymphatic: No cervical or supraclavicular adenopathy, no axillary or inguinal adenopathy Lungs no rales or rhonchi Savage regular rate and rhythm Abd soft, nontender, positive bowel sounds, no palpable splenomegaly MSK no focal spinal tenderness, no joint edema Neuro: non-focal, well-oriented, appropriate affect Breasts: Bilateral gynecomastia, no masses palpated Skin: In the left lower quadrant laterally, just about of the flank, there is a darkened area of skin shaped like a spindle and measuring approximately 8 x 3 cm.  In the middle of this there is a 1-1/2 cm firm subcutaneous mass which is not tender or erythematous.  LAB RESULTS: On 04/04/2019 per Dr. Elissa Hefty note the patient white cell count was 4.5, with neutrophils 2.4, hemoglobin was 6.6, MCV 101, and platelets 41,000.  CMP     Component Value Date/Time   NA 141 03/24/2019 0440   NA 135 (L) 07/04/2013 1256   K 3.4 (L) 03/24/2019 0440   K 4.4 07/11/2013 1108   CL 106 03/24/2019 0440   CL 96 (L) 07/04/2013 1256   CO2 26 03/24/2019 0440   CO2 32 07/04/2013 1256   GLUCOSE 105 (H) 03/24/2019 0440   GLUCOSE 189 (H) 07/04/2013 1256   BUN 12 03/24/2019 0440   BUN 26 (H) 07/04/2013 1256   CREATININE 1.26 (H) 03/24/2019 0440   CREATININE 10.86 (H) 07/04/2013 1256   CALCIUM 9.9 03/24/2019 0440   CALCIUM 8.8 07/04/2013 1256   PROT 7.1 03/22/2019 1339   ALBUMIN 4.3 03/22/2019 1339   AST 19 03/22/2019 1339   ALT 13 03/22/2019 1339   ALKPHOS 66 03/22/2019 1339   BILITOT 1.6 (H) 03/22/2019 1339   GFRNONAA 57 (L) 03/24/2019 0440  GFRNONAA 4 (L) 07/04/2013 1256   GFRAA >60 03/24/2019 0440   GFRAA 5 (L) 07/04/2013 1256    No results found for: TOTALPROTELP, ALBUMINELP, A1GS, A2GS, BETS, BETA2SER, GAMS, MSPIKE, SPEI  No results found for: KPAFRELGTCHN, LAMBDASER, KAPLAMBRATIO  Lab Results  Component Value Date   WBC 3.3 (L) 03/24/2019    NEUTROABS 1.0 (L) 03/22/2019   HGB 7.8 (L) 03/24/2019   HCT 23.9 (L) 03/24/2019   MCV 101.7 (H) 03/24/2019   PLT 66 (L) 03/24/2019    '@LASTCHEMISTRY'$ @  No results found for: LABCA2  No components found for: KZLDJT701  No results for input(s): INR in the last 168 hours.  No results found for: LABCA2  No results found for: XBL390  No results found for: ZES923  No results found for: RAQ762  No results found for: CA2729  No components found for: HGQUANT  No results found for: CEA1 / No results found for: CEA1   No results found for: AFPTUMOR  No results found for: CHROMOGRNA  No results found for: PSA1  No visits with results within 3 Day(s) from this visit.  Latest known visit with results is:  Hospital Outpatient Visit on 04/05/2019  Component Date Value Ref Range Status  . ABO/RH(D) 04/05/2019 A POS   Final  . Antibody Screen 04/05/2019 NEG   Final  . Sample Expiration 04/05/2019 04/08/2019,2359   Final  . Unit Number 04/05/2019 [id number]   Final  . Blood Component Type 04/05/2019 RED CELLS,LR   Final  . Unit division 04/05/2019 00   Final  . Status of Unit 04/05/2019 ISSUED,FINAL   Final  . Transfusion Status 04/05/2019 OK TO TRANSFUSE   Final  . Crossmatch Result 04/05/2019    Final                   Value:Compatible Performed at [facility], [place] [address], [place], [place] [address]   . Unit Number 04/05/2019 [id number]   Final  . Blood Component Type 04/05/2019 RED CELLS,LR   Final  . Unit division 04/05/2019 00   Final  . Status of Unit 04/05/2019 ISSUED,FINAL   Final  . Transfusion Status 04/05/2019 OK TO TRANSFUSE   Final  . Crossmatch Result 04/05/2019 Compatible   Final  . Order Confirmation 04/05/2019    Final                   Value:ORDER PROCESSED BY BLOOD BANK Performed at [facility], [place] [address] [place], [place] [address]   . ISSUE DATE / TIME 04/05/2019 [id number]   Final  . Blood Product Unit Number 04/05/2019  [id number]   Final  . PRODUCT CODE 04/05/2019 [id number]   Final  . Unit Type and Rh 04/05/2019 6200   Final  . Blood Product Expiration Date 04/05/2019 [id number]   Final  . ISSUE DATE / TIME 04/05/2019 [id number]   Final  . Blood Product Unit Number 04/05/2019 [id number]   Final  . PRODUCT CODE 04/05/2019 [id number]   Final  . Unit Type and Rh 04/05/2019 6200   Final  . Blood Product Expiration Date 04/05/2019 [id number]   Final    (this displays the last labs from the last 3 days)  No results found for: TOTALPROTELP, ALBUMINELP, A1GS, A2GS, BETS, BETA2SER, GAMS, MSPIKE, SPEI (this displays SPEP labs)  No results found for: KPAFRELGTCHN, LAMBDASER, KAPLAMBRATIO (kappa/lambda light chains)  No results found for: HGBA, HGBA2QUANT, HGBFQUANT, HGBSQUAN (Hemoglobinopathy evaluation)   No results found  for: LDH  Lab Results  Component Value Date   IRON 134 03/23/2019   TIBC 326 03/23/2019   IRONPCTSAT 41 (H) 03/23/2019   (Iron and TIBC)  Lab Results  Component Value Date   FERRITIN 1,060 (H) 03/23/2019    Urinalysis    Component Value Date/Time   COLORURINE YELLOW 03/22/2019 1609   APPEARANCEUR CLEAR 03/22/2019 1609   LABSPEC 1.010 03/22/2019 1609   PHURINE 6.0 03/22/2019 1609   GLUCOSEU NEGATIVE 03/22/2019 1609   HGBUR NEGATIVE 03/22/2019 1609   BILIRUBINUR NEGATIVE 03/22/2019 1609   [place] 03/22/2019 1609   PROTEINUR NEGATIVE 03/22/2019 1609   UROBILINOGEN 0.2 08/17/2012 1120   NITRITE NEGATIVE 03/22/2019 1609   LEUKOCYTESUR NEGATIVE 03/22/2019 1609     STUDIES:  No results found.   ELIGIBLE FOR AVAILABLE RESEARCH PROTOCOL: no   ASSESSMENT: 73 y.o. [place] man with a history of end-stage renal disease secondary to hypertension and diabetes, on hemodialysis as of 2010, status post renal transplant at [facility] 03/31/2015, admitted here 03/22/2019 with fatigue and shortness of breath and found to have a hemoglobin of 5.9,  with an MCV of 114.8, white cell count 2.7, platelet count 67,000.  Differential showed 1.0 neutrophils, 1.1 lymphocytes, 0.0 basophils.  Pathology smear review showed pancytopenia and macrocytosis with a mild left shift and hypo-granularity.  PLAN: Unfortunately Mr. Schara was not scheduled for lab work today as originally intended so I was not able to review his blood film or let him know what his counts were today. I spent approximately 60 minutes face to face with him with more than 50% of that time in counseling and coordination of care. Specifically we reviewed the biology of the patient's diagnosis and the specifics of his situation.    We reviewed the fact that all blood cells are made in the marrow of bones and that therefore in many cases we go ahead and perform a bone marrow biopsy to find out what is going on. There are 3 types of cells, red cells which carry oxygen; white cells which are his immune system; and platelets which are clotting cells.   When only 1 cell line is involved then we generally feel the bone marrow itself is not the problem.  When all 3 cell lines are affected as is the case with Mr. Carmickle then the problem is not limited to a cell line but is a general problem affecting the marrow  The problem affecting the marrow could be external such as medication and certainly Mr. Hank is on immunosuppressive medications which could suppress his marrow.  The other possibility is intrinsic bone marrow problem such as myelodysplasia.  He is particularly concerned about the latter because of his history of Agent Orange exposure  After his recent admission his Bactrim was decreased to 3 times a week and his mycophenolate dose was cut in half.  When he saw Dr. Marval Regal on 04/04/2019 his tacrolimus level was down to 6.6, after dose adjustments.  It had been 10.5 on mid June.  I apologized to Mr. Lippman for his not being scheduled for lab work today and asked him to come back  tomorrow so that I can obtain the appropriate labs.  I do note that in the pathology smear review from the recent hospitalization found his white cells to be slightly left shifted and hypogranular.  This does raise concern for myelodysplasia and my feeling is unless he has had a very good response to his medication dose adjustment  he will likely need a bone marrow biopsy.  The patient has a good understanding of the overall plan.  He agrees with it.  He will call with any problems that may develop before her next visit here.    , Virgie Dad, MD  04/22/19 4:36 PM Medical Oncology and Hematology [facility] [address] [place], [place] [address] Tel. [phone]    Fax. [phone]   I, Jacqualyn Posey am acting as a Education administrator for Chauncey Cruel, MD.   I, Lurline Del MD, have reviewed the above documentation for accuracy and completeness, and I agree with the above.

## 2019-04-23 ENCOUNTER — Telehealth: Payer: Self-pay | Admitting: Oncology

## 2019-04-23 NOTE — Telephone Encounter (Signed)
I talk with patient regarding schedule  

## 2019-04-23 NOTE — Telephone Encounter (Signed)
Scheduled appt per 7/07 sch message - unable to reach pt. Left message with appt date and time   

## 2019-04-24 ENCOUNTER — Other Ambulatory Visit: Payer: Medicare Other

## 2019-04-26 ENCOUNTER — Inpatient Hospital Stay: Payer: Medicare Other

## 2019-04-26 ENCOUNTER — Other Ambulatory Visit: Payer: Self-pay

## 2019-04-26 DIAGNOSIS — D61818 Other pancytopenia: Secondary | ICD-10-CM

## 2019-04-26 DIAGNOSIS — N1330 Unspecified hydronephrosis: Secondary | ICD-10-CM

## 2019-04-26 DIAGNOSIS — N186 End stage renal disease: Secondary | ICD-10-CM

## 2019-04-26 DIAGNOSIS — D649 Anemia, unspecified: Secondary | ICD-10-CM

## 2019-04-26 DIAGNOSIS — E083219 Diabetes mellitus due to underlying condition with mild nonproliferative diabetic retinopathy with macular edema, unspecified eye: Secondary | ICD-10-CM

## 2019-04-26 LAB — CBC WITH DIFFERENTIAL/PLATELET
Abs Immature Granulocytes: 2.47 10*3/uL — ABNORMAL HIGH (ref 0.00–0.07)
Basophils Absolute: 0 10*3/uL (ref 0.0–0.1)
Basophils Relative: 0 %
Eosinophils Absolute: 0 10*3/uL (ref 0.0–0.5)
Eosinophils Relative: 0 %
HCT: 22.7 % — ABNORMAL LOW (ref 39.0–52.0)
Hemoglobin: 7.1 g/dL — ABNORMAL LOW (ref 13.0–17.0)
Immature Granulocytes: 27 %
Lymphocytes Relative: 21 %
Lymphs Abs: 2 10*3/uL (ref 0.7–4.0)
MCH: 32.9 pg (ref 26.0–34.0)
MCHC: 31.3 g/dL (ref 30.0–36.0)
MCV: 105.1 fL — ABNORMAL HIGH (ref 80.0–100.0)
Monocytes Absolute: 1 10*3/uL (ref 0.1–1.0)
Monocytes Relative: 10 %
Neutro Abs: 3.8 10*3/uL (ref 1.7–7.7)
Neutrophils Relative %: 42 %
Platelets: 78 10*3/uL — ABNORMAL LOW (ref 150–400)
RBC: 2.16 MIL/uL — ABNORMAL LOW (ref 4.22–5.81)
RDW: 23.1 % — ABNORMAL HIGH (ref 11.5–15.5)
WBC: 9.2 10*3/uL (ref 4.0–10.5)
nRBC: 3 % — ABNORMAL HIGH (ref 0.0–0.2)

## 2019-04-26 LAB — CMP (CANCER CENTER ONLY)
ALT: 19 U/L (ref 0–44)
AST: 23 U/L (ref 15–41)
Albumin: 3.9 g/dL (ref 3.5–5.0)
Alkaline Phosphatase: 92 U/L (ref 38–126)
Anion gap: 11 (ref 5–15)
BUN: 14 mg/dL (ref 8–23)
CO2: 23 mmol/L (ref 22–32)
Calcium: 10.2 mg/dL (ref 8.9–10.3)
Chloride: 107 mmol/L (ref 98–111)
Creatinine: 1.35 mg/dL — ABNORMAL HIGH (ref 0.61–1.24)
GFR, Est AFR Am: >60 mL/min (ref >60)
GFR, Estimated: 53 mL/min — ABNORMAL LOW (ref >60)
Glucose, Bld: 173 mg/dL — ABNORMAL HIGH (ref 70–99)
Potassium: 4.1 mmol/L (ref 3.5–5.1)
Sodium: 141 mmol/L (ref 135–145)
Total Bilirubin: 0.9 mg/dL (ref 0.3–1.2)
Total Protein: 7.1 g/dL (ref 6.5–8.1)

## 2019-04-26 LAB — SAVE SMEAR(SSMR), FOR PROVIDER SLIDE REVIEW

## 2019-04-26 LAB — RETICULOCYTES
Immature Retic Fract: 32.2 % — ABNORMAL HIGH (ref 2.3–15.9)
RBC.: 2.09 MIL/uL — ABNORMAL LOW (ref 4.22–5.81)
Retic Count, Absolute: 62.5 10*3/uL (ref 19.0–186.0)
Retic Ct Pct: 3 % (ref 0.4–3.1)

## 2019-04-29 LAB — TACROLIMUS LEVEL: Tacrolimus (FK506) - LabCorp: 6.6 ng/mL (ref 2.0–20.0)

## 2019-05-02 ENCOUNTER — Inpatient Hospital Stay: Payer: Medicare Other

## 2019-05-02 ENCOUNTER — Inpatient Hospital Stay (HOSPITAL_BASED_OUTPATIENT_CLINIC_OR_DEPARTMENT_OTHER): Payer: Medicare Other | Admitting: Adult Health

## 2019-05-02 ENCOUNTER — Other Ambulatory Visit: Payer: Self-pay | Admitting: *Deleted

## 2019-05-02 ENCOUNTER — Encounter: Payer: Self-pay | Admitting: Adult Health

## 2019-05-02 ENCOUNTER — Other Ambulatory Visit: Payer: Self-pay

## 2019-05-02 VITALS — BP 171/78 | HR 66 | Temp 98.7°F | Resp 18

## 2019-05-02 DIAGNOSIS — D649 Anemia, unspecified: Secondary | ICD-10-CM

## 2019-05-02 DIAGNOSIS — D61818 Other pancytopenia: Secondary | ICD-10-CM

## 2019-05-02 LAB — CBC WITH DIFFERENTIAL/PLATELET
Abs Immature Granulocytes: 2.58 10*3/uL — ABNORMAL HIGH (ref 0.00–0.07)
Basophils Absolute: 0 10*3/uL (ref 0.0–0.1)
Basophils Relative: 0 %
Eosinophils Absolute: 0 10*3/uL (ref 0.0–0.5)
Eosinophils Relative: 0 %
HCT: 19.1 % — ABNORMAL LOW (ref 39.0–52.0)
Hemoglobin: 5.9 g/dL — CL (ref 13.0–17.0)
Immature Granulocytes: 26 %
Lymphocytes Relative: 16 %
Lymphs Abs: 1.5 10*3/uL (ref 0.7–4.0)
MCH: 32.6 pg (ref 26.0–34.0)
MCHC: 30.9 g/dL (ref 30.0–36.0)
MCV: 105.5 fL — ABNORMAL HIGH (ref 80.0–100.0)
Monocytes Absolute: 1.6 10*3/uL — ABNORMAL HIGH (ref 0.1–1.0)
Monocytes Relative: 16 %
Neutro Abs: 4.1 10*3/uL (ref 1.7–7.7)
Neutrophils Relative %: 42 %
Platelets: 85 10*3/uL — ABNORMAL LOW (ref 150–400)
RBC: 1.81 MIL/uL — ABNORMAL LOW (ref 4.22–5.81)
RDW: 23.9 % — ABNORMAL HIGH (ref 11.5–15.5)
WBC: 9.8 10*3/uL (ref 4.0–10.5)
nRBC: 3.2 % — ABNORMAL HIGH (ref 0.0–0.2)

## 2019-05-02 NOTE — Progress Notes (Signed)
Patient alert and oriented, remained in lying position for 30 minutes post procedure. Pressure applied to site x 30 minutes. Site dressing clean, dry, and intact. Post-procedure education provided by RN. Patient verbalized understanding of post-care. VSS. No complaints of pain or discomfort. Patient ambulatory and escorted to exit by RN.

## 2019-05-02 NOTE — Progress Notes (Signed)
INDICATION: pancytopenia   Bone Marrow Biopsy and Aspiration Procedure Note   Informed consent was obtained and potential risks including bleeding, infection and pain were reviewed with the patient.  The patient's name, date of birth, identification, consent and allergies were verified prior to the start of procedure and time out was performed.  The left posterior iliac crest was chosen as the site of biopsy.  The skin was prepped with ChloraPrep.   12 cc of 2% lidocaine was used to provide local anaesthesia.   4 cc of bone marrow aspirate was obtained and was aparticulate therefore, I obtained 3 biopsies totaling to 3.5-4cm biopsy.  Pressure was applied to the biopsy site and bandage was placed over the biopsy site. Patient was made to lie on the back for 30 mins prior to discharge.  The procedure was tolerated well. COMPLICATIONS: None BLOOD LOSS: none The patient was discharged home in stable condition with a 1 week follow up to review results.  Patient was provided with post bone marrow biopsy instructions and instructed to call if there was any bleeding or worsening pain.  Specimens sent for flow cytometry, cytogenetics and additional studies.  Signed Scot Dock, NP

## 2019-05-02 NOTE — Patient Instructions (Signed)

## 2019-05-03 ENCOUNTER — Inpatient Hospital Stay: Payer: Medicare Other

## 2019-05-03 ENCOUNTER — Other Ambulatory Visit: Payer: Self-pay | Admitting: Medical

## 2019-05-03 ENCOUNTER — Other Ambulatory Visit: Payer: Self-pay

## 2019-05-03 ENCOUNTER — Ambulatory Visit (HOSPITAL_BASED_OUTPATIENT_CLINIC_OR_DEPARTMENT_OTHER): Payer: Medicare Other | Admitting: Medical

## 2019-05-03 VITALS — BP 192/79 | HR 64 | Temp 99.1°F | Resp 18

## 2019-05-03 DIAGNOSIS — D649 Anemia, unspecified: Secondary | ICD-10-CM | POA: Diagnosis not present

## 2019-05-03 DIAGNOSIS — I10 Essential (primary) hypertension: Secondary | ICD-10-CM

## 2019-05-03 LAB — PREPARE RBC (CROSSMATCH)

## 2019-05-03 MED ORDER — ACETAMINOPHEN 325 MG PO TABS
650.0000 mg | ORAL_TABLET | Freq: Once | ORAL | Status: AC
  Administered 2019-05-03: 650 mg via ORAL

## 2019-05-03 MED ORDER — CLONIDINE HCL 0.1 MG PO TABS: 0.1000 mg | ORAL_TABLET | Freq: Once | ORAL | Status: DC

## 2019-05-03 MED ORDER — CLONIDINE HCL 0.1 MG PO TABS
0.1000 mg | ORAL_TABLET | Freq: Once | ORAL | Status: AC
  Administered 2019-05-03: 0.1 mg via ORAL

## 2019-05-03 MED ORDER — SODIUM CHLORIDE 0.9% IV SOLUTION
250.0000 mL | Freq: Once | INTRAVENOUS | Status: AC
  Administered 2019-05-03: 13:00:00 250 mL via INTRAVENOUS
  Filled 2019-05-03: qty 250

## 2019-05-03 MED ORDER — ACETAMINOPHEN 325 MG PO TABS
ORAL_TABLET | ORAL | Status: AC
  Filled 2019-05-03: qty 2

## 2019-05-03 MED ORDER — CLONIDINE HCL 0.1 MG PO TABS
ORAL_TABLET | ORAL | Status: AC
  Filled 2019-05-03: qty 1

## 2019-05-03 MED ORDER — DIPHENHYDRAMINE HCL 25 MG PO CAPS
ORAL_CAPSULE | ORAL | Status: AC
  Filled 2019-05-03: qty 1

## 2019-05-03 MED ORDER — DIPHENHYDRAMINE HCL 25 MG PO CAPS
25.0000 mg | ORAL_CAPSULE | Freq: Once | ORAL | Status: AC
  Administered 2019-05-03: 13:00:00 25 mg via ORAL

## 2019-05-03 NOTE — Patient Instructions (Signed)
Coronavirus (COVID-19) Are you at risk? ° °Are you at risk for the Coronavirus (COVID-19)? ° °To be considered HIGH RISK for Coronavirus (COVID-19), you have to meet the following criteria: ° °• Traveled to [place], [place], [place], [place] or [place]; or in the [place] to [place], [place], [place], or [place]; and have fever, cough, and shortness of breath within the last 2 weeks of travel OR °• Been in close contact with a person diagnosed with COVID-19 within the last 2 weeks and have fever, cough, and shortness of breath °• IF YOU DO NOT MEET THESE CRITERIA, YOU ARE CONSIDERED LOW RISK FOR COVID-19. ° °What to do if you are HIGH RISK for COVID-19? ° °• If you are having a medical emergency, call 911. °• Seek medical care right away. Before you go to a doctor’s office, urgent care or emergency department, call ahead and tell them about your recent travel, contact with someone diagnosed with COVID-19, and your symptoms. You should receive instructions from your physician’s office regarding next steps of care.  °• When you arrive at healthcare provider, tell the healthcare staff immediately you have returned from visiting [place], [place], [place], [place] or [place]; or traveled in the [place] to [place], [place], [place], or [place]; in the last two weeks or you have been in close contact with a person diagnosed with COVID-19 in the last 2 weeks.   °• Tell the health care staff about your symptoms: fever, cough and shortness of breath. °• After you have been seen by a medical provider, you will be either: °o Tested for (COVID-19) and discharged home on quarantine except to seek medical care if symptoms worsen, and asked to  °- Stay home and avoid contact with others until you get your results (4-5 days)  °- Avoid travel on public transportation if possible (such as bus, train, or airplane) or °o Sent to the Emergency Department by EMS for evaluation, COVID-19 testing, and possible  admission depending on your condition and test results. ° °What to do if you are LOW RISK for COVID-19? ° °Reduce your risk of any infection by using the same precautions used for avoiding the common cold or flu:  °• Wash your hands often with soap and warm water for at least 20 seconds.  If soap and water are not readily available, use an alcohol-based hand sanitizer with at least 60% alcohol.  °• If coughing or sneezing, cover your mouth and nose by coughing or sneezing into the elbow areas of your shirt or coat, into a tissue or into your sleeve (not your hands). °• Avoid shaking hands with others and consider head nods or verbal greetings only. °• Avoid touching your eyes, nose, or mouth with unwashed hands.  °• Avoid close contact with people who are sick. °• Avoid places or events with large numbers of people in one location, like concerts or sporting events. °• Carefully consider travel plans you have or are making. °• If you are planning any travel outside or inside the [place], visit the CDC’s Travelers’ Health webpage for the latest health notices. °• If you have some symptoms but not all symptoms, continue to monitor at home and seek medical attention if your symptoms worsen. °• If you are having a medical emergency, call 911. ° ° °ADDITIONAL HEALTHCARE OPTIONS FOR PATIENTS ° °[facility] / e-Visit: [email or web address] °        °[facility]: [phone] ° °[facility]   [facility]: [facility]: [phone]    Blood Transfusion, Adult, Care After This sheet gives you information about how to care for yourself after your procedure. Your health care provider may also give you more specific instructions. If you have problems or questions, contact your health care provider. What can I expect after the procedure? After your procedure, it is common to have:  Bruising and soreness where the IV tube  was inserted.  Headache. Follow these instructions at home:   Take over-the-counter and prescription medicines only as told by your health care provider.  Return to your normal activities as told by your health care provider.  Follow instructions from your health care provider about how to take care of your IV insertion site. Make sure you: ? Wash your hands with soap and water before you change your bandage (dressing). If soap and water are not available, use hand sanitizer. ? Change your dressing as told by your health care provider.  Check your IV insertion site every day for signs of infection. Check for: ? More redness, swelling, or pain. ? More fluid or blood. ? Warmth. ? Pus or a bad smell. Contact a health care provider if:  You have more redness, swelling, or pain around the IV insertion site.  You have more fluid or blood coming from the IV insertion site.  Your IV insertion site feels warm to the touch.  You have pus or a bad smell coming from the IV insertion site.  Your urine turns pink, red, or brown.  You feel weak after doing your normal activities. Get help right away if:  You have signs of a serious allergic or immune system reaction, including: ? Itchiness. ? Hives. ? Trouble breathing. ? Anxiety. ? Chest or lower back pain. ? Fever, flushing, and chills. ? Rapid pulse. ? Rash. ? Diarrhea. ? Vomiting. ? Dark urine. ? Serious headache. ? Dizziness. ? Stiff neck. ? Yellow coloration of the face or the white parts of the eyes (jaundice). This information is not intended to replace advice given to you by your health care provider. Make sure you discuss any questions you have with your health care provider. Document Released: 10/24/2014 Document Revised: 07/31/2017 Document Reviewed: 04/18/2016 Elsevier Patient Education  2020 [employer].

## 2019-05-04 ENCOUNTER — Inpatient Hospital Stay: Payer: Medicare Other

## 2019-05-04 ENCOUNTER — Other Ambulatory Visit: Payer: Self-pay

## 2019-05-04 DIAGNOSIS — D649 Anemia, unspecified: Secondary | ICD-10-CM

## 2019-05-04 LAB — ABO/RH: ABO/RH(D): A POS

## 2019-05-04 MED ORDER — SODIUM CHLORIDE 0.9% IV SOLUTION
250.0000 mL | Freq: Once | INTRAVENOUS | Status: DC
  Filled 2019-05-04: qty 250

## 2019-05-04 MED ORDER — DIPHENHYDRAMINE HCL 25 MG PO TABS
25.0000 mg | ORAL_TABLET | Freq: Once | ORAL | Status: AC
  Administered 2019-05-04: 09:00:00 25 mg via ORAL

## 2019-05-04 MED ORDER — DIPHENHYDRAMINE HCL 25 MG PO CAPS
ORAL_CAPSULE | ORAL | Status: AC
  Filled 2019-05-04: qty 1

## 2019-05-04 MED ORDER — ACETAMINOPHEN 325 MG PO TABS
650.0000 mg | ORAL_TABLET | Freq: Once | ORAL | Status: AC
  Administered 2019-05-04: 650 mg via ORAL

## 2019-05-04 MED ORDER — ACETAMINOPHEN 325 MG PO TABS
ORAL_TABLET | ORAL | Status: AC
  Filled 2019-05-04: qty 2

## 2019-05-04 NOTE — Patient Instructions (Signed)
Blood Transfusion, Adult, Care After This sheet gives you information about how to care for yourself after your procedure. Your doctor may also give you more specific instructions. If you have problems or questions, contact your doctor. Follow these instructions at home:   Take over-the-counter and prescription medicines only as told by your doctor.  Go back to your normal activities as told by your doctor.  Follow instructions from your doctor about how to take care of the area where an IV tube was put into your vein (insertion site). Make sure you: ? Wash your hands with soap and water before you change your bandage (dressing). If there is no soap and water, use hand sanitizer. ? Change your bandage as told by your doctor.  Check your IV insertion site every day for signs of infection. Check for: ? More redness, swelling, or pain. ? More fluid or blood. ? Warmth. ? Pus or a bad smell. Contact a doctor if:  You have more redness, swelling, or pain around the IV insertion site.  You have more fluid or blood coming from the IV insertion site.  Your IV insertion site feels warm to the touch.  You have pus or a bad smell coming from the IV insertion site.  Your pee (urine) turns pink, red, or brown.  You feel weak after doing your normal activities. Get help right away if:  You have signs of a serious allergic or body defense (immune) system reaction, including: ? Itchiness. ? Hives. ? Trouble breathing. ? Anxiety. ? Pain in your chest or lower back. ? Fever, flushing, and chills. ? Fast pulse. ? Rash. ? Watery poop (diarrhea). ? Throwing up (vomiting). ? Dark pee. ? Serious headache. ? Dizziness. ? Stiff neck. ? Yellow color in your face or the white parts of your eyes (jaundice). Summary  After a blood transfusion, return to your normal activities as told by your doctor.  Every day, check for signs of infection where the IV tube was put into your vein.  Some  signs of infection are warm skin, more redness and pain, more fluid or blood, and pus or a bad smell where the needle went in.  Contact your doctor if you feel weak or have any unusual symptoms. This information is not intended to replace advice given to you by your health care provider. Make sure you discuss any questions you have with your health care provider. Document Released: 10/24/2014 Document Revised: 02/07/2018 Document Reviewed: 05/27/2016 Elsevier Patient Education  2020 [employer]  

## 2019-05-05 LAB — BPAM RBC
Blood Product Expiration Date: 202008132359
Blood Product Expiration Date: 202008132359
ISSUE DATE / TIME: 202007171335
ISSUE DATE / TIME: 202007180912
Unit Type and Rh: 6200
Unit Type and Rh: 6200

## 2019-05-05 LAB — TYPE AND SCREEN
ABO/RH(D): A POS
Antibody Screen: NEGATIVE
Unit division: 0
Unit division: 0

## 2019-05-06 NOTE — Progress Notes (Signed)
After first unit of PRBC's on 05/03/19 HTN noted in patient (see flowsheets). Sandi Mealy, PA called to bedside. Clonidine given (see eMAR) per Sandi Mealy, PA. Pt will return to [facility] on 05/04/19 for 2nd unit of PRBC's. Pt verbalizes understanding of plan of care. Patient will call [facility] with any questions/concerns.

## 2019-05-07 NOTE — Progress Notes (Signed)
The patient was seen today in the infusion room while he was receiving a transfusion.  He had been given 1 unit of packed red blood cells.  It was noted that his blood pressure was 194/82.  He was given clonidine 0.1 mg p.o. x1.  He reports that he has taken his blood pressure medicines today.  Despite this intervention his blood pressure remained elevated.  It was decided that the second unit packed red blood cells would be transfused tomorrow morning.  We are hopeful that the patient will have a lower blood pressure after taking his morning meds.  [name] was agreeable to this plan.  Sandi Mealy, MHS, PA-C Physician Assistant

## 2019-05-09 ENCOUNTER — Encounter (HOSPITAL_COMMUNITY): Payer: Self-pay | Admitting: Oncology

## 2019-05-09 ENCOUNTER — Telehealth: Payer: Self-pay | Admitting: *Deleted

## 2019-05-09 DIAGNOSIS — D61818 Other pancytopenia: Secondary | ICD-10-CM

## 2019-05-09 DIAGNOSIS — D696 Thrombocytopenia, unspecified: Secondary | ICD-10-CM

## 2019-05-09 NOTE — Telephone Encounter (Signed)
This RN returned call to pt per his VM wanting to know result of BMBX .  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 Reiterated need for all results needed to give a confirmative answer but wanted to let him know what we have on hand due to MD out of the office until next week.  This RN inquired about the pt's current status per noted low heme requiring transfusion. Mr Laday states he is fairing normal " just can't handle the heat ".  Above discussed- as well as probable need to recheck CBC next week for evaluation of any further blood products,  Appointment made per above.   No further needs at this time.

## 2019-05-10 ENCOUNTER — Encounter (HOSPITAL_COMMUNITY): Payer: Self-pay | Admitting: Oncology

## 2019-05-13 ENCOUNTER — Other Ambulatory Visit: Payer: Self-pay | Admitting: Oncology

## 2019-05-14 ENCOUNTER — Encounter (HOSPITAL_COMMUNITY): Payer: Self-pay | Admitting: Oncology

## 2019-05-14 ENCOUNTER — Other Ambulatory Visit: Payer: Self-pay | Admitting: Oncology

## 2019-05-14 ENCOUNTER — Inpatient Hospital Stay: Payer: Medicare Other

## 2019-05-14 ENCOUNTER — Other Ambulatory Visit: Payer: Self-pay

## 2019-05-14 DIAGNOSIS — D61818 Other pancytopenia: Secondary | ICD-10-CM

## 2019-05-14 DIAGNOSIS — D649 Anemia, unspecified: Secondary | ICD-10-CM | POA: Diagnosis not present

## 2019-05-14 DIAGNOSIS — D696 Thrombocytopenia, unspecified: Secondary | ICD-10-CM

## 2019-05-14 LAB — CBC WITH DIFFERENTIAL (CANCER CENTER ONLY)
Abs Immature Granulocytes: 2.92 10*3/uL — ABNORMAL HIGH (ref 0.00–0.07)
Basophils Absolute: 0 10*3/uL (ref 0.0–0.1)
Basophils Relative: 0 %
Eosinophils Absolute: 0 10*3/uL (ref 0.0–0.5)
Eosinophils Relative: 0 %
HCT: 23.7 % — ABNORMAL LOW (ref 39.0–52.0)
Hemoglobin: 7.6 g/dL — ABNORMAL LOW (ref 13.0–17.0)
Immature Granulocytes: 26 %
Lymphocytes Relative: 15 %
Lymphs Abs: 1.7 10*3/uL (ref 0.7–4.0)
MCH: 32.2 pg (ref 26.0–34.0)
MCHC: 32.1 g/dL (ref 30.0–36.0)
MCV: 100.4 fL — ABNORMAL HIGH (ref 80.0–100.0)
Monocytes Absolute: 1.6 10*3/uL — ABNORMAL HIGH (ref 0.1–1.0)
Monocytes Relative: 14 %
Neutro Abs: 5.2 10*3/uL (ref 1.7–7.7)
Neutrophils Relative %: 45 %
Platelet Count: 66 10*3/uL — ABNORMAL LOW (ref 150–400)
RBC: 2.36 MIL/uL — ABNORMAL LOW (ref 4.22–5.81)
RDW: 22.1 % — ABNORMAL HIGH (ref 11.5–15.5)
WBC Count: 11.4 10*3/uL — ABNORMAL HIGH (ref 4.0–10.5)
nRBC: 3.8 % — ABNORMAL HIGH (ref 0.0–0.2)

## 2019-05-14 LAB — COMPREHENSIVE METABOLIC PANEL
ALT: 11 U/L (ref 0–44)
AST: 14 U/L — ABNORMAL LOW (ref 15–41)
Albumin: 3.6 g/dL (ref 3.5–5.0)
Alkaline Phosphatase: 99 U/L (ref 38–126)
Anion gap: 10 (ref 5–15)
BUN: 14 mg/dL (ref 8–23)
CO2: 25 mmol/L (ref 22–32)
Calcium: 10.3 mg/dL (ref 8.9–10.3)
Chloride: 106 mmol/L (ref 98–111)
Creatinine, Ser: 1.37 mg/dL — ABNORMAL HIGH (ref 0.61–1.24)
GFR calc Af Amer: >60 mL/min (ref >60)
GFR calc non Af Amer: 52 mL/min — ABNORMAL LOW (ref >60)
Glucose, Bld: 130 mg/dL — ABNORMAL HIGH (ref 70–99)
Potassium: 3.5 mmol/L (ref 3.5–5.1)
Sodium: 141 mmol/L (ref 135–145)
Total Bilirubin: 1 mg/dL (ref 0.3–1.2)
Total Protein: 6.9 g/dL (ref 6.5–8.1)

## 2019-05-14 LAB — SAMPLE TO BLOOD BANK

## 2019-05-17 ENCOUNTER — Telehealth: Payer: Self-pay | Admitting: *Deleted

## 2019-05-17 NOTE — Telephone Encounter (Signed)
This RN spoke with pt's wife per her call stating concern due to increased somulence in pt since having BMBX - " he basically sleeps all night and during the day he is very tired and if he sits he sleeps but he doesn't have energy to do anything "  She states pt is arousable, not confused and able to do ADL's ( eating, bathroom )  Primary concern is noted change in ability to stay awake and be able to participate in activities.  Per phone discussion- pt will come in for lab and visit with mid level on Monday for review .  Appointment made and informed pt as well per above discussion.

## 2019-05-20 ENCOUNTER — Other Ambulatory Visit: Payer: Self-pay

## 2019-05-20 ENCOUNTER — Encounter: Payer: Self-pay | Admitting: Adult Health

## 2019-05-20 ENCOUNTER — Inpatient Hospital Stay: Payer: Medicare Other

## 2019-05-20 ENCOUNTER — Inpatient Hospital Stay: Payer: Medicare Other | Attending: Adult Health | Admitting: Adult Health

## 2019-05-20 VITALS — BP 172/62 | HR 69 | Temp 98.7°F | Resp 18 | Ht 72.0 in | Wt 218.2 lb

## 2019-05-20 DIAGNOSIS — Z87891 Personal history of nicotine dependence: Secondary | ICD-10-CM | POA: Insufficient documentation

## 2019-05-20 DIAGNOSIS — D631 Anemia in chronic kidney disease: Secondary | ICD-10-CM | POA: Diagnosis present

## 2019-05-20 DIAGNOSIS — N186 End stage renal disease: Secondary | ICD-10-CM | POA: Diagnosis not present

## 2019-05-20 DIAGNOSIS — Z79899 Other long term (current) drug therapy: Secondary | ICD-10-CM | POA: Diagnosis not present

## 2019-05-20 DIAGNOSIS — Z794 Long term (current) use of insulin: Secondary | ICD-10-CM | POA: Insufficient documentation

## 2019-05-20 DIAGNOSIS — Z940 Kidney transplant status: Secondary | ICD-10-CM | POA: Diagnosis not present

## 2019-05-20 DIAGNOSIS — D649 Anemia, unspecified: Secondary | ICD-10-CM | POA: Diagnosis not present

## 2019-05-20 DIAGNOSIS — D696 Thrombocytopenia, unspecified: Secondary | ICD-10-CM

## 2019-05-20 DIAGNOSIS — E1122 Type 2 diabetes mellitus with diabetic chronic kidney disease: Secondary | ICD-10-CM | POA: Diagnosis not present

## 2019-05-20 DIAGNOSIS — I120 Hypertensive chronic kidney disease with stage 5 chronic kidney disease or end stage renal disease: Secondary | ICD-10-CM | POA: Insufficient documentation

## 2019-05-20 DIAGNOSIS — D61818 Other pancytopenia: Secondary | ICD-10-CM | POA: Diagnosis not present

## 2019-05-20 DIAGNOSIS — D469 Myelodysplastic syndrome, unspecified: Secondary | ICD-10-CM | POA: Insufficient documentation

## 2019-05-20 DIAGNOSIS — Z992 Dependence on renal dialysis: Secondary | ICD-10-CM | POA: Insufficient documentation

## 2019-05-20 HISTORY — DX: Myelodysplastic syndrome, unspecified: D46.9

## 2019-05-20 LAB — CBC WITH DIFFERENTIAL/PLATELET
Abs Immature Granulocytes: 6.28 10*3/uL — ABNORMAL HIGH (ref 0.00–0.07)
Basophils Absolute: 0.1 10*3/uL (ref 0.0–0.1)
Basophils Relative: 0 %
Eosinophils Absolute: 0 10*3/uL (ref 0.0–0.5)
Eosinophils Relative: 0 %
HCT: 22.2 % — ABNORMAL LOW (ref 39.0–52.0)
Hemoglobin: 7 g/dL — ABNORMAL LOW (ref 13.0–17.0)
Immature Granulocytes: 39 %
Lymphocytes Relative: 14 %
Lymphs Abs: 2.3 10*3/uL (ref 0.7–4.0)
MCH: 32.9 pg (ref 26.0–34.0)
MCHC: 31.5 g/dL (ref 30.0–36.0)
MCV: 104.2 fL — ABNORMAL HIGH (ref 80.0–100.0)
Monocytes Absolute: 1.7 10*3/uL — ABNORMAL HIGH (ref 0.1–1.0)
Monocytes Relative: 11 %
Neutro Abs: 5.9 10*3/uL (ref 1.7–7.7)
Neutrophils Relative %: 36 %
Platelets: 85 10*3/uL — ABNORMAL LOW (ref 150–400)
RBC: 2.13 MIL/uL — ABNORMAL LOW (ref 4.22–5.81)
RDW: 23.3 % — ABNORMAL HIGH (ref 11.5–15.5)
WBC: 16.3 10*3/uL — ABNORMAL HIGH (ref 4.0–10.5)
nRBC: 3 % — ABNORMAL HIGH (ref 0.0–0.2)

## 2019-05-20 LAB — COMPREHENSIVE METABOLIC PANEL
ALT: 17 U/L (ref 0–44)
AST: 20 U/L (ref 15–41)
Albumin: 3.4 g/dL — ABNORMAL LOW (ref 3.5–5.0)
Alkaline Phosphatase: 100 U/L (ref 38–126)
Anion gap: 10 (ref 5–15)
BUN: 16 mg/dL (ref 8–23)
CO2: 25 mmol/L (ref 22–32)
Calcium: 10.2 mg/dL (ref 8.9–10.3)
Chloride: 105 mmol/L (ref 98–111)
Creatinine, Ser: 1.37 mg/dL — ABNORMAL HIGH (ref 0.61–1.24)
GFR calc Af Amer: >60 mL/min (ref >60)
GFR calc non Af Amer: 52 mL/min — ABNORMAL LOW (ref >60)
Glucose, Bld: 255 mg/dL — ABNORMAL HIGH (ref 70–99)
Potassium: 3.7 mmol/L (ref 3.5–5.1)
Sodium: 140 mmol/L (ref 135–145)
Total Bilirubin: 1.1 mg/dL (ref 0.3–1.2)
Total Protein: 7 g/dL (ref 6.5–8.1)

## 2019-05-20 LAB — SAMPLE TO BLOOD BANK

## 2019-05-20 LAB — SAVE SMEAR(SSMR), FOR PROVIDER SLIDE REVIEW

## 2019-05-20 NOTE — Progress Notes (Signed)
[place]  [phone] [phone]    ID: [name] DOB: Jun 17, 1946  MR#: [id number]  KKX#:[id number]  Patient Care Team: Rogers Blocker, MD as PCP - General (Internal Medicine) Magrinat, Virgie Dad, MD as Consulting Physician (Oncology) Donato Heinz, MD as Consulting Physician (Nephrology) Ander Slade, Carlisle Beers, MD as Referring Physician (Ophthalmology) [name] Erskine Squibb, MD as Referring Physician (Vascular Surgery) OTHER MD:   CHIEF COMPLAINT: MDS  CURRENT TREATMENT: aranesp   HISTORY OF CURRENT ILLNESS: From the original intake note:  [name] is a 73 year old [place] man with a history of type 2 diabetes complicated by significant retinopathy and end-stage renal disease, status post renal transplant, followed by Dr. Marval Regal.  [name] notes that he was feeling more fatigued than usual, and that last time he felt this way, his Vitamin D levels were low. He called his doctors office to ask for his blood work to be ran before he was sent to the ED by Dr. Marlou Sa. The patient presented to the emergency room 03/22/2019 with symptomatic anemia, was found to have hemoglobin of 5.9 and pancytopenia.  Additional work-up is listed below in the assessment section, but there was no evidence of B12, folate or iron deficiency and reticulocyte count was inappropriately normal.  HIV and SARS coronavirus 2 tests were negative.  Creatinine was 1.49 with GFR was 54.  Glucose was 221.  Other labs of interest obtained 03/23/2019 included a B12 level of 944, folate 13.2, ferritin 1060, with serum iron 134, saturation 41%.  The absolute reticulocyte count was 64.5.  His end-stage renal disease is felt to be due to diabetes and hypertension.  He was on dialysis starting 2010 but on 03/31/2015 he underwent a cadaveric renal transplant at [facility]  He had transient CMV viremia, which was treated.  He continues on Bactrim prophylaxis and  tacrolimus.  He also receives mycophenolate and when he had the episode of severe anemia and a mycophenolate was decreased in half and his Bactrim was decreased to Monday Wednesday Friday.  His tacrolimus level was on the high side.  This has been since adjusted by Dr. Marval Regal and is being repeated today  The patient was transfused and discharged and referred here for evaluation of pancytopenia  INTERVAL HISTORY:  [name] is here today for follow up of his bone marrow biopsy results that were done on 05/02/2019.  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  He met with Dr. Jana Hakim and myself to review the results along with his wife [name] who was connected via doximity.    REVIEW OF SYSTEMS: [name] has been fatigued. He is taking frequent naps. He denies night sweats, weight loss, or lymphadenopathy.  He has a rash on his scalp that started after he was working outdoors.  He is following up with his PCP and dermatologya bout this.    PAST MEDICAL HISTORY: Past Medical History:  Diagnosis Date   Anemia    hx of at beginnning of dialysis- 05/2009   Arthritis    right hip    End stage renal disease ([place])    [place]    End stage renal disease on dialysis [facility])    Essential hypertension, malignant    Hypertrophy of prostate without urinary obstruction and other lower urinary tract symptoms (LUTS)    Pure hypercholesterolemia    Renal insufficiency    Type II or unspecified type diabetes mellitus without mention of complication, not  stated as uncontrolled    TYPE 2      PAST SURGICAL HISTORY: Past Surgical History:  Procedure Laterality Date   A/V FISTULAGRAM Left 06/12/2017   Procedure: A/V Fistulagram;  Surgeon: Algernon Huxley, MD;  Location: [facility];  Service: Cardiovascular;  Laterality: Left;   CARDIAC CATHETERIZATION  04/29/10   normal coronaries, LVEF 45%, mild global LV hypokinesis  ([place])   CATARACT EXTRACTION W/PHACO Right 04/03/2013   Procedure: CATARACT EXTRACTION PHACO AND INTRAOCULAR LENS PLACEMENT ([place]);  Surgeon: Adonis Brook, MD;  Location: [place];  Service: Ophthalmology;  Laterality: Right;   CERVICAL DISCECTOMY  03/1999   COLONOSCOPY     Hx: of   CYSTOSCOPY WITH BIOPSY  11/01/2012   Procedure: CYSTOSCOPY WITH BIOPSY;  Surgeon: Molli Hazard, MD;  Location: [facility];  Service: Urology;  Laterality: N/A;  with fulgeration   CYSTOSCOPY WITH RETROGRADE PYELOGRAM, URETEROSCOPY AND STENT PLACEMENT  11/01/2012   Procedure: CYSTOSCOPY WITH RETROGRADE PYELOGRAM, URETEROSCOPY AND STENT PLACEMENT;  Surgeon: Molli Hazard, MD;  [facility];  Service: Urology;  Laterality: Bilateral;   DIALYSIS FISTULA CREATION  2010   EYE SURGERY  2012   cataracts   EYE SURGERY  11/2014   HOLMIUM LASER APPLICATION  4/66/5993   Procedure: HOLMIUM LASER APPLICATION;  Surgeon: Molli Hazard, MD;  [facility];  Service: Urology;  Laterality: Bilateral;   KIDNEY TRANSPLANT  2016   MEMBRANE PEEL Right 09/04/2013   Procedure: MEMBRANE PEEL;  Surgeon: Adonis Brook, MD;  Location: [place];  Service: Ophthalmology;  Laterality: Right;   NECK SURGERY  1990   PARS PLANA VITRECTOMY Right 09/04/2013   Procedure: PARS PLANA VITRECTOMY WITH 23 GAUGE;  Surgeon: Adonis Brook, MD;  Location: [place];  Service: Ophthalmology;  Laterality: Right;   PARS PLANA VITRECTOMY Left 02/17/2015   PARS PLANA VITRECTOMY Left 02/17/2015   Procedure: PARS PLANA VITRECTOMY WITH 25G REMOVAL/SUTURE INTRAOCULAR LENS;  Surgeon: Hayden Pedro, MD;  Location: [place];  Service: Ophthalmology;  Laterality: Left;   PERIPHERAL VASCULAR CATHETERIZATION Left 08/08/2016   Procedure: A/V Shuntogram/Fistulagram;  Surgeon: Algernon Huxley, MD;  Location: [place] [facility];  Service: Cardiovascular;  Laterality: Left;   PERIPHERAL VASCULAR CATHETERIZATION N/A 08/08/2016   Procedure:  A/V Shunt Intervention;  Surgeon: Algernon Huxley, MD;  Location: [place] [facility];  Service: Cardiovascular;  Laterality: N/A;   PHOTOCOAGULATION WITH LASER Right 09/04/2013   Procedure: PHOTOCOAGULATION WITH LASER;  Surgeon: Adonis Brook, MD;  Location: [place];  Service: Ophthalmology;  Laterality: Right;  ENDOLASER   TRANSTHORACIC ECHOCARDIOGRAM  06/19/2012   normal LV sys function, EF 55-60%, mild to mod diastolic dysfunction, mild AI ([place])  tonsillectomy @ 73 years of age   FAMILY HISTORY: Family History  Problem Relation Age of Onset   Breast cancer Mother 15       deceased   Pneumonia Father 40       deceased   Hypertension Brother    Diabetes Mellitus II Brother    Cancer Sister        Intestinal   The patient's s father died from pneumonia at age 54.  He was originally a Psychologist, sport and exercise and later worked in a Designer, television/film set.  Patients' mother died from breast cancer at age 69. The patient has 7 siblings. Patient denies anyone in her family having breast, ovarian, prostate, or pancreatic cancer.  1 sister died from intestinal cancer.  Another sister, Katharine Look, died from an aneurism.  SOCIAL HISTORY: (Current as of 04/22/2019) Mr. Kohen retired from the [employer] office. 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 He notes that he was exposed to agent orange. His wife, Eline, is a retired Corporate treasurer. They have two children, Lennette Bihari and Lauren. Lennette Bihari lives in [place] and works at the [employer] for [employer]. Lauren lives in [place] and teaches handicapped children for the [place] school [employer].Kennith Center has two grandchildren.  His daughter is expecting in August 2020. The patient he belongs to [employer] on [place]: In the absence of any documentation, Rayshun's spouse is automatically her healthcare power of attorney.     HEALTH MAINTENANCE: Social History   Tobacco Use   Smoking status: Former Smoker    Types:  Cigarettes    Quit date: 10/02/1990    Years since quitting: 28.6   Smokeless tobacco: Never Used  Substance Use Topics   Alcohol use: Yes    Comment: occasional   Drug use: No    Colonoscopy: up to date/ [facility]  Allergies  Allergen Reactions   Codeine Nausea And Vomiting and Nausea Only   Doxycycline Nausea And Vomiting and Nausea Only   Latex Hives and Rash   Tape Other (See Comments)    The "plastic, waffle-printed" tape tears and bruises the skin    Current Outpatient Medications  Medication Sig Dispense Refill   acetaminophen (TYLENOL) 325 MG tablet Take 650 mg by mouth every 6 (six) hours as needed (for pain or headaches).      allopurinol (ZYLOPRIM) 300 MG tablet Take 300 mg by mouth as needed (as directed for gout flares).      amLODipine (NORVASC) 5 MG tablet Take 1 tablet (5 mg total) by mouth daily. 30 tablet 0   donepezil (ARICEPT) 10 MG tablet Take 10 mg by mouth at bedtime.     dorzolamide (TRUSOPT) 2 % ophthalmic solution Place 1 drop into both eyes 2 (two) times a day.      doxazosin (CARDURA) 4 MG tablet Take 4 mg by mouth 2 (two) times daily.     furosemide (LASIX) 40 MG tablet Take 40-80 mg by mouth See admin instructions. Take 80 mg by mouth in the morning and 40 mg in the evening     HUMALOG KWIKPEN 100 UNIT/ML KiwkPen Inject 8-16 Units into the skin See admin instructions. Inject 8-16 units into the skin three times a day before meals, PER SLIDING SCALE (BGL 150 or less, use nothing)  3   Insulin Glargine (LANTUS SOLOSTAR) 100 UNIT/ML Solostar Pen Inject 34 Units into the skin daily before breakfast.      K Phos Mono-Sod Phos Di & Mono (PHOSPHA 250 NEUTRAL) [phone] MG TABS Take 1-2 tablets by mouth See admin instructions. Take 2 tablets by mouth in the morning and 1 tablet at bedtime at 8 PM     ketoconazole (NIZORAL) 2 % shampoo Apply 1 application topically every 7 (seven) days.   2   KLOR-CON M20 20 MEQ tablet Take 20 mEq by mouth  daily.  1   latanoprost (XALATAN) 0.005 % ophthalmic solution Place 1 drop into both eyes at bedtime.      linagliptin (TRADJENTA) 5 MG TABS tablet Take 5 mg by mouth daily with breakfast.      losartan (COZAAR) 100 MG tablet Take 100 mg by mouth daily.     metoprolol tartrate (LOPRESSOR) 25 MG tablet Take 25 mg by mouth  2 (two) times daily.     Multiple Vitamins-Minerals (ONE-A-DAY MENS 50+ ADVANTAGE) TABS Take 1 tablet by mouth daily with breakfast.     mupirocin cream (BACTROBAN) 2 % Apply 1 application topically 2 (two) times daily. 15 g 0   mycophenolate (MYFORTIC) 180 MG EC tablet Take 1 tablet (180 mg total) by mouth 2 (two) times daily. 60 tablet 0   NON FORMULARY Apply 1 application topically See admin instructions. Skin-So-Soft lotion: Apply as needed to dry sites of skin     sodium chloride (OCEAN) 0.65 % SOLN nasal spray Place 2 sprays into both nostrils as needed for congestion.      sulfamethoxazole-trimethoprim (BACTRIM) 400-80 MG tablet Take 1 tablet by mouth every Monday, Wednesday, and Friday.     tacrolimus (PROGRAF) 0.5 MG capsule Take 0.5 mg by mouth 2 (two) times daily.     tacrolimus (PROGRAF) 1 MG capsule Take 3 mg by mouth 2 (two) times daily.   11   tadalafil (CIALIS) 20 MG tablet Take 20 mg by mouth daily as needed for erectile dysfunction.      Vitamin D, Ergocalciferol, (DRISDOL) 50000 units CAPS capsule Take 50,000 Units by mouth every 7 (seven) days.      No current facility-administered medications for this visit.      OBJECTIVE: Older African-American man who appears stated age  73:   05/20/19 1434  BP: (!) 172/62  Pulse: 69  Resp: 18  Temp: 98.7 F (37.1 C)  SpO2: 100%     Body mass index is 29.59 kg/m.   Wt Readings from Last 3 Encounters:  05/20/19 218 lb 3.2 oz (99 kg)  04/22/19 218 lb 12.8 oz (99.2 kg)  04/08/19 221 lb (100.2 kg)      ECOG FS:2 - Symptomatic, <50% confined to bed   GENERAL: Patient is a well appearing  older male in no acute distress HEENT:  Sclerae anicteric.  Oropharynx clear and moist. No ulcerations or evidence of oropharyngeal candidiasis. Neck is supple.  NODES:  No cervical, supraclavicular, or axillary lymphadenopathy palpated.  LUNGS:  Clear to auscultation bilaterally.  No wheezes or rhonchi. HEART:  Regular rate and rhythm. No murmur appreciated. ABDOMEN:  Soft, nontender.  Positive, normoactive bowel sounds. No organomegaly palpated. MSK:  No focal spinal tenderness to palpation. Full range of motion bilaterally in the upper extremities. EXTREMITIES:  No peripheral edema.   SKIN:  Scalp with scabbing present. NEURO:  Nonfocal. Well oriented.  Appropriate affect.   LAB RESULTS: On 04/04/2019 per Dr. Elissa Hefty note the patient white cell count was 4.5, with neutrophils 2.4, hemoglobin was 6.6, MCV 101, and platelets 41,000.  CMP     Component Value Date/Time   NA 141 05/14/2019 1145   NA 135 (L) 07/04/2013 1256   K 3.5 05/14/2019 1145   K 4.4 07/11/2013 1108   CL 106 05/14/2019 1145   CL 96 (L) 07/04/2013 1256   CO2 25 05/14/2019 1145   CO2 32 07/04/2013 1256   GLUCOSE 130 (H) 05/14/2019 1145   GLUCOSE 189 (H) 07/04/2013 1256   BUN 14 05/14/2019 1145   BUN 26 (H) 07/04/2013 1256   CREATININE 1.37 (H) 05/14/2019 1145   CREATININE 1.35 (H) 04/26/2019 1455   CREATININE 10.86 (H) 07/04/2013 1256   CALCIUM 10.3 05/14/2019 1145   CALCIUM 8.8 07/04/2013 1256   PROT 6.9 05/14/2019 1145   ALBUMIN 3.6 05/14/2019 1145   AST 14 (L) 05/14/2019 1145   AST 23 04/26/2019 1455  ALT 11 05/14/2019 1145   ALT 19 04/26/2019 1455   ALKPHOS 99 05/14/2019 1145   BILITOT 1.0 05/14/2019 1145   BILITOT 0.9 04/26/2019 1455   GFRNONAA 52 (L) 05/14/2019 1145   GFRNONAA 53 (L) 04/26/2019 1455   GFRNONAA 4 (L) 07/04/2013 1256   GFRAA >60 05/14/2019 1145   GFRAA >60 04/26/2019 1455   GFRAA 5 (L) 07/04/2013 1256    No results found for: TOTALPROTELP, ALBUMINELP, A1GS, A2GS, BETS,  BETA2SER, GAMS, MSPIKE, SPEI  No results found for: KPAFRELGTCHN, LAMBDASER, KAPLAMBRATIO  Lab Results  Component Value Date   WBC 16.3 (H) 05/20/2019   NEUTROABS PENDING 05/20/2019   HGB 7.0 (L) 05/20/2019   HCT 22.2 (L) 05/20/2019   MCV 104.2 (H) 05/20/2019   PLT 85 (L) 05/20/2019    '@LASTCHEMISTRY'$ @  No results found for: LABCA2  No components found for: HMCNOB096  No results for input(s): INR in the last 168 hours.  No results found for: LABCA2  No results found for: GEZ662  No results found for: HUT654  No results found for: YTK354  No results found for: CA2729  No components found for: HGQUANT  No results found for: CEA1 / No results found for: CEA1   No results found for: AFPTUMOR  No results found for: CHROMOGRNA  No results found for: PSA1  Appointment on 05/20/2019  Component Date Value Ref Range Status   WBC 05/20/2019 16.3* 4.0 - 10.5 K/uL Final   RBC 05/20/2019 2.13* 4.22 - 5.81 MIL/uL Final   Hemoglobin 05/20/2019 7.0* 13.0 - 17.0 g/dL Final   HCT 05/20/2019 22.2* 39.0 - 52.0 % Final   MCV 05/20/2019 104.2* 80.0 - 100.0 fL Final   MCH 05/20/2019 32.9  26.0 - 34.0 pg Final   MCHC 05/20/2019 31.5  30.0 - 36.0 g/dL Final   RDW 05/20/2019 23.3* 11.5 - 15.5 % Final   Platelets 05/20/2019 85* 150 - 400 K/uL Final   nRBC 05/20/2019 3.0* 0.0 - 0.2 % Final   Performed at [facility], [place] [address] [place], [place] [address]   Neutrophils Relative % 05/20/2019 PENDING  % Incomplete   Neutro Abs 05/20/2019 PENDING  1.7 - 7.7 K/uL Incomplete   Band Neutrophils 05/20/2019 PENDING  % Incomplete   Lymphocytes Relative 05/20/2019 PENDING  % Incomplete   Lymphs Abs 05/20/2019 PENDING  0.7 - 4.0 K/uL Incomplete   Monocytes Relative 05/20/2019 PENDING  % Incomplete   Monocytes Absolute 05/20/2019 PENDING  0.1 - 1.0 K/uL Incomplete   Eosinophils Relative 05/20/2019 PENDING  % Incomplete   Eosinophils Absolute  05/20/2019 PENDING  0.0 - 0.5 K/uL Incomplete   Basophils Relative 05/20/2019 PENDING  % Incomplete   Basophils Absolute 05/20/2019 PENDING  0.0 - 0.1 K/uL Incomplete   WBC Morphology 05/20/2019 PENDING   Incomplete   RBC Morphology 05/20/2019 PENDING   Incomplete   Smear Review 05/20/2019 PENDING   Incomplete   Other 05/20/2019 PENDING  % Incomplete   nRBC 05/20/2019 PENDING  0 /100 WBC Incomplete   Metamyelocytes Relative 05/20/2019 PENDING  % Incomplete   Myelocytes 05/20/2019 PENDING  % Incomplete   Promyelocytes Relative 05/20/2019 PENDING  % Incomplete   Blasts 05/20/2019 PENDING  % Incomplete    (this displays the last labs from the last 3 days)  No results found for: TOTALPROTELP, ALBUMINELP, A1GS, A2GS, BETS, BETA2SER, GAMS, MSPIKE, SPEI (this displays SPEP labs)  No results found for: KPAFRELGTCHN, LAMBDASER, KAPLAMBRATIO (kappa/lambda light chains)  No results found for:  HGBA, HGBA2QUANT, HGBFQUANT, HGBSQUAN (Hemoglobinopathy evaluation)   No results found for: LDH  Lab Results  Component Value Date   IRON 134 03/23/2019   TIBC 326 03/23/2019   IRONPCTSAT 41 (H) 03/23/2019   (Iron and TIBC)  Lab Results  Component Value Date   FERRITIN 1,060 (H) 03/23/2019    Urinalysis    Component Value Date/Time   COLORURINE YELLOW 03/22/2019 1609   APPEARANCEUR CLEAR 03/22/2019 1609   LABSPEC 1.010 03/22/2019 1609   PHURINE 6.0 03/22/2019 1609   GLUCOSEU NEGATIVE 03/22/2019 1609   HGBUR NEGATIVE 03/22/2019 1609   BILIRUBINUR NEGATIVE 03/22/2019 1609   KETONESUR NEGATIVE 03/22/2019 1609   PROTEINUR NEGATIVE 03/22/2019 1609   UROBILINOGEN 0.2 08/17/2012 1120   NITRITE NEGATIVE 03/22/2019 1609   LEUKOCYTESUR NEGATIVE 03/22/2019 1609     STUDIES:  Pathology and cytogenetics results discussed with the patient who also received copies today   ELIGIBLE FOR AVAILABLE RESEARCH PROTOCOL: no   ASSESSMENT: 73 y.o. [place] man with a history of end-stage  renal disease secondary to hypertension and diabetes, on hemodialysis as of 2010  (a) status post renal transplant at [facility] 03/31/2015  (1) pancytopenia noted 03/22/2019 with fatigue and shortness of breath  (a) smear review confirmed pancytopenia and macrocytosis with hypogranular polys   (b) Bone Marrow Biopsy on 05/02/2019 shows hypercellular marrow with trilineal dysplasia, slightly left shifted myeloid maturation but no increase in blast, mild myelofibrosis  (c) Flow Cytometry shows no increase in CD34 positive blasts and left shifted myeloid maturation   (d) 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   (2) myelodysplasia, as per #1 above  (a) prognosis: low risk (average survival < 3 years  (3) aranesp to start 05/22/2019  (a) baseline epo level  (b) ferritin 1060 on 03/23/2019  PLAN: I spent approximately 30 minutes face to face with Kennith Center with more than 50% of that time spent in counseling and coordination of care.  His wife was also involved through speaker phone.  They understand that while we thought initially perhaps the medications for his renal transplant were affecting bone marrow function, correction and adjustment of those medications really did not solve the problem.  Accordingly we proceeded to bone marrow biopsy.  This showed frank dysplasia in all 3 cell lines.  On the good side it showed only mild fibrosis and no increase in blasts.  Chromosomes from this test showed deletion 13.  This puts the patient in the intermediate risk category  We discussed the fact that the only curative treatment for myelodysplasia is bone marrow transplantation.  I do not believe Mr. Gerstner is a candidate.  I offered to refer him to a transplant center if they would like further discussion with they are in agreement that his general health  would not allow such extreme treatments.  If we cannot cure it we can try to treat symptomatically.  The biggest problem he is having is his anemia.  We are going to start erythropoietin on 05/22/2019 and repeat those treatments every 2 weeks.  He has an elevated ferritin which indicates no iron deficiency at present.  If we can get the hemoglobin to a 10-11 range then I think he would be much more functional and less symptomatic.  If we are not able to accomplish this with iron asp alone, we may need to use decitabine and he understands  that this is likely in his future.  They have a good understanding of the overall plan.  They agree with it.  They know the goal of treatment is control.  He will return in 2 days for blood transfusion and his first iron dose and we will obtain a baseline EPO at the level at that time as well  They know to call for any other issue that may develop before the start of treatment.     Wilber Bihari, NP 05/20/19 2:45 PM Medical Oncology and Hematology [facility] [address] [place], [place] [address] Tel. [phone]    Fax. [phone]

## 2019-05-21 ENCOUNTER — Telehealth: Payer: Self-pay | Admitting: Oncology

## 2019-05-21 NOTE — Telephone Encounter (Signed)
I talk with patient regarding schedule  

## 2019-05-22 ENCOUNTER — Inpatient Hospital Stay: Payer: Medicare Other

## 2019-05-22 ENCOUNTER — Other Ambulatory Visit: Payer: Self-pay

## 2019-05-22 ENCOUNTER — Other Ambulatory Visit: Payer: Self-pay | Admitting: Hematology and Oncology

## 2019-05-22 VITALS — BP 160/72 | HR 63 | Temp 98.3°F | Resp 18

## 2019-05-22 DIAGNOSIS — D61818 Other pancytopenia: Secondary | ICD-10-CM

## 2019-05-22 DIAGNOSIS — D469 Myelodysplastic syndrome, unspecified: Secondary | ICD-10-CM

## 2019-05-22 DIAGNOSIS — D649 Anemia, unspecified: Secondary | ICD-10-CM

## 2019-05-22 DIAGNOSIS — N186 End stage renal disease: Secondary | ICD-10-CM | POA: Diagnosis not present

## 2019-05-22 DIAGNOSIS — D696 Thrombocytopenia, unspecified: Secondary | ICD-10-CM

## 2019-05-22 DIAGNOSIS — Z940 Kidney transplant status: Secondary | ICD-10-CM

## 2019-05-22 LAB — COMPREHENSIVE METABOLIC PANEL
ALT: 14 U/L (ref 0–44)
AST: 17 U/L (ref 15–41)
Albumin: 3.4 g/dL — ABNORMAL LOW (ref 3.5–5.0)
Alkaline Phosphatase: 103 U/L (ref 38–126)
Anion gap: 12 (ref 5–15)
BUN: 16 mg/dL (ref 8–23)
CO2: 22 mmol/L (ref 22–32)
Calcium: 10.2 mg/dL (ref 8.9–10.3)
Chloride: 107 mmol/L (ref 98–111)
Creatinine, Ser: 1.37 mg/dL — ABNORMAL HIGH (ref 0.61–1.24)
GFR calc Af Amer: >60 mL/min (ref >60)
GFR calc non Af Amer: 52 mL/min — ABNORMAL LOW (ref >60)
Glucose, Bld: 175 mg/dL — ABNORMAL HIGH (ref 70–99)
Potassium: 3.6 mmol/L (ref 3.5–5.1)
Sodium: 141 mmol/L (ref 135–145)
Total Bilirubin: 0.9 mg/dL (ref 0.3–1.2)
Total Protein: 6.7 g/dL (ref 6.5–8.1)

## 2019-05-22 LAB — CBC WITH DIFFERENTIAL/PLATELET
Abs Immature Granulocytes: 5.59 10*3/uL — ABNORMAL HIGH (ref 0.00–0.07)
Basophils Absolute: 0.1 10*3/uL (ref 0.0–0.1)
Basophils Relative: 1 %
Eosinophils Absolute: 0 10*3/uL (ref 0.0–0.5)
Eosinophils Relative: 0 %
HCT: 21.3 % — ABNORMAL LOW (ref 39.0–52.0)
Hemoglobin: 6.6 g/dL — CL (ref 13.0–17.0)
Immature Granulocytes: 32 %
Lymphocytes Relative: 15 %
Lymphs Abs: 2.6 10*3/uL (ref 0.7–4.0)
MCH: 32.5 pg (ref 26.0–34.0)
MCHC: 31 g/dL (ref 30.0–36.0)
MCV: 104.9 fL — ABNORMAL HIGH (ref 80.0–100.0)
Monocytes Absolute: 1.4 10*3/uL — ABNORMAL HIGH (ref 0.1–1.0)
Monocytes Relative: 8 %
Neutro Abs: 8 10*3/uL — ABNORMAL HIGH (ref 1.7–7.7)
Neutrophils Relative %: 44 %
Platelets: 59 10*3/uL — ABNORMAL LOW (ref 150–400)
RBC: 2.03 MIL/uL — ABNORMAL LOW (ref 4.22–5.81)
RDW: 23.3 % — ABNORMAL HIGH (ref 11.5–15.5)
WBC: 17.7 10*3/uL — ABNORMAL HIGH (ref 4.0–10.5)
nRBC: 2.7 % — ABNORMAL HIGH (ref 0.0–0.2)

## 2019-05-22 LAB — PREPARE RBC (CROSSMATCH)

## 2019-05-22 LAB — FERRITIN: Ferritin: 3297 ng/mL — ABNORMAL HIGH (ref 24–336)

## 2019-05-22 LAB — LACTATE DEHYDROGENASE: LDH: 373 U/L — ABNORMAL HIGH (ref 98–192)

## 2019-05-22 LAB — RETICULOCYTES
Immature Retic Fract: 25 % — ABNORMAL HIGH (ref 2.3–15.9)
RBC.: 2.02 MIL/uL — ABNORMAL LOW (ref 4.22–5.81)
Retic Count, Absolute: 72.3 10*3/uL (ref 19.0–186.0)
Retic Ct Pct: 3.6 % — ABNORMAL HIGH (ref 0.4–3.1)

## 2019-05-22 MED ORDER — ACETAMINOPHEN 325 MG PO TABS
650.0000 mg | ORAL_TABLET | Freq: Once | ORAL | Status: AC
  Administered 2019-05-22: 650 mg via ORAL

## 2019-05-22 MED ORDER — ACETAMINOPHEN 325 MG PO TABS
ORAL_TABLET | ORAL | Status: AC
  Filled 2019-05-22: qty 2

## 2019-05-22 MED ORDER — DIPHENHYDRAMINE HCL 25 MG PO CAPS
ORAL_CAPSULE | ORAL | Status: AC
  Filled 2019-05-22: qty 1

## 2019-05-22 MED ORDER — DARBEPOETIN ALFA 300 MCG/0.6ML IJ SOSY: 300.0000 ug | PREFILLED_SYRINGE | Freq: Once | INTRAMUSCULAR | Status: DC

## 2019-05-22 MED ORDER — EPOETIN ALFA-EPBX 10000 UNIT/ML IJ SOLN
20000.0000 [IU] | Freq: Once | INTRAMUSCULAR | Status: AC
  Administered 2019-05-22: 20000 [IU] via SUBCUTANEOUS
  Filled 2019-05-22: qty 2

## 2019-05-22 MED ORDER — SODIUM CHLORIDE 0.9% IV SOLUTION
250.0000 mL | Freq: Once | INTRAVENOUS | Status: AC
  Administered 2019-05-22: 250 mL via INTRAVENOUS
  Filled 2019-05-22: qty 250

## 2019-05-22 MED ORDER — DIPHENHYDRAMINE HCL 25 MG PO CAPS
25.0000 mg | ORAL_CAPSULE | Freq: Once | ORAL | Status: AC
  Administered 2019-05-22: 25 mg via ORAL

## 2019-05-22 NOTE — Patient Instructions (Signed)
Blood Transfusion, Adult, Care After This sheet gives you information about how to care for yourself after your procedure. Your doctor may also give you more specific instructions. If you have problems or questions, contact your doctor. Follow these instructions at home:   Take over-the-counter and prescription medicines only as told by your doctor.  Go back to your normal activities as told by your doctor.  Follow instructions from your doctor about how to take care of the area where an IV tube was put into your vein (insertion site). Make sure you: ? Wash your hands with soap and water before you change your bandage (dressing). If there is no soap and water, use hand sanitizer. ? Change your bandage as told by your doctor.  Check your IV insertion site every day for signs of infection. Check for: ? More redness, swelling, or pain. ? More fluid or blood. ? Warmth. ? Pus or a bad smell. Contact a doctor if:  You have more redness, swelling, or pain around the IV insertion site.  You have more fluid or blood coming from the IV insertion site.  Your IV insertion site feels warm to the touch.  You have pus or a bad smell coming from the IV insertion site.  Your pee (urine) turns pink, red, or brown.  You feel weak after doing your normal activities. Get help right away if:  You have signs of a serious allergic or body defense (immune) system reaction, including: ? Itchiness. ? Hives. ? Trouble breathing. ? Anxiety. ? Pain in your chest or lower back. ? Fever, flushing, and chills. ? Fast pulse. ? Rash. ? Watery poop (diarrhea). ? Throwing up (vomiting). ? Dark pee. ? Serious headache. ? Dizziness. ? Stiff neck. ? Yellow color in your face or the white parts of your eyes (jaundice). Summary  After a blood transfusion, return to your normal activities as told by your doctor.  Every day, check for signs of infection where the IV tube was put into your vein.  Some  signs of infection are warm skin, more redness and pain, more fluid or blood, and pus or a bad smell where the needle went in.  Contact your doctor if you feel weak or have any unusual symptoms. This information is not intended to replace advice given to you by your health care provider. Make sure you discuss any questions you have with your health care provider. Document Released: 10/24/2014 Document Revised: 02/07/2018 Document Reviewed: 05/27/2016 Elsevier Patient Education  2020 [employer]  

## 2019-05-22 NOTE — Progress Notes (Signed)
Patient's insurance does not cover Aranesp, patient will require Retacrit. Retacrit authorized per [place]. Spoke with Dr. Jana Hakim who has requested patient start on Retacrit (Epoetin alfa) 20,000 units every week. MD would like to use this dose the next few weeks and then will reassess response. Orders updated.   Thank you for allowing pharmacy to be a part of this patient's care.  Leron Croak, PharmD PGY2 Oncology/Hematology Pharmacy Resident 05/22/2019 2:55 PM

## 2019-05-23 ENCOUNTER — Telehealth: Payer: Self-pay | Admitting: Oncology

## 2019-05-23 LAB — TYPE AND SCREEN
ABO/RH(D): A POS
Antibody Screen: NEGATIVE
Unit division: 0

## 2019-05-23 LAB — BPAM RBC
Blood Product Expiration Date: 202008172359
ISSUE DATE / TIME: 202008051430
Unit Type and Rh: 6200

## 2019-05-23 LAB — ERYTHROPOIETIN: Erythropoietin: 1627 m[IU]/mL — ABNORMAL HIGH (ref 2.6–18.5)

## 2019-05-23 NOTE — Telephone Encounter (Signed)
Added wkly lab/injections per 8/6 schedule messages. Confirmed with patient.

## 2019-05-29 ENCOUNTER — Other Ambulatory Visit: Payer: Self-pay

## 2019-05-29 ENCOUNTER — Inpatient Hospital Stay: Payer: Medicare Other

## 2019-05-29 ENCOUNTER — Other Ambulatory Visit: Payer: Self-pay | Admitting: Oncology

## 2019-05-29 ENCOUNTER — Telehealth: Payer: Self-pay | Admitting: *Deleted

## 2019-05-29 VITALS — BP 150/62 | HR 70 | Temp 99.0°F

## 2019-05-29 DIAGNOSIS — Z940 Kidney transplant status: Secondary | ICD-10-CM

## 2019-05-29 DIAGNOSIS — D631 Anemia in chronic kidney disease: Secondary | ICD-10-CM

## 2019-05-29 DIAGNOSIS — N186 End stage renal disease: Secondary | ICD-10-CM | POA: Diagnosis not present

## 2019-05-29 DIAGNOSIS — D649 Anemia, unspecified: Secondary | ICD-10-CM

## 2019-05-29 DIAGNOSIS — D61818 Other pancytopenia: Secondary | ICD-10-CM

## 2019-05-29 DIAGNOSIS — D469 Myelodysplastic syndrome, unspecified: Secondary | ICD-10-CM

## 2019-05-29 DIAGNOSIS — N189 Chronic kidney disease, unspecified: Secondary | ICD-10-CM

## 2019-05-29 LAB — COMPREHENSIVE METABOLIC PANEL
ALT: 15 U/L (ref 0–44)
AST: 18 U/L (ref 15–41)
Albumin: 3.5 g/dL (ref 3.5–5.0)
Alkaline Phosphatase: 98 U/L (ref 38–126)
Anion gap: 9 (ref 5–15)
BUN: 15 mg/dL (ref 8–23)
CO2: 24 mmol/L (ref 22–32)
Calcium: 10 mg/dL (ref 8.9–10.3)
Chloride: 109 mmol/L (ref 98–111)
Creatinine, Ser: 1.34 mg/dL — ABNORMAL HIGH (ref 0.61–1.24)
GFR calc Af Amer: >60 mL/min (ref >60)
GFR calc non Af Amer: 53 mL/min — ABNORMAL LOW (ref >60)
Glucose, Bld: 127 mg/dL — ABNORMAL HIGH (ref 70–99)
Potassium: 3.7 mmol/L (ref 3.5–5.1)
Sodium: 142 mmol/L (ref 135–145)
Total Bilirubin: 0.9 mg/dL (ref 0.3–1.2)
Total Protein: 6.6 g/dL (ref 6.5–8.1)

## 2019-05-29 LAB — SAMPLE TO BLOOD BANK

## 2019-05-29 LAB — CBC WITH DIFFERENTIAL/PLATELET
Abs Immature Granulocytes: 6.45 10*3/uL — ABNORMAL HIGH (ref 0.00–0.07)
Basophils Absolute: 0.1 10*3/uL (ref 0.0–0.1)
Basophils Relative: 1 %
Eosinophils Absolute: 0 10*3/uL (ref 0.0–0.5)
Eosinophils Relative: 0 %
HCT: 21.1 % — ABNORMAL LOW (ref 39.0–52.0)
Hemoglobin: 6.7 g/dL — CL (ref 13.0–17.0)
Immature Granulocytes: 40 %
Lymphocytes Relative: 14 %
Lymphs Abs: 2.3 10*3/uL (ref 0.7–4.0)
MCH: 32.4 pg (ref 26.0–34.0)
MCHC: 31.8 g/dL (ref 30.0–36.0)
MCV: 101.9 fL — ABNORMAL HIGH (ref 80.0–100.0)
Monocytes Absolute: 0.8 10*3/uL (ref 0.1–1.0)
Monocytes Relative: 5 %
Neutro Abs: 6.3 10*3/uL (ref 1.7–7.7)
Neutrophils Relative %: 40 %
Platelets: 56 10*3/uL — ABNORMAL LOW (ref 150–400)
RBC: 2.07 MIL/uL — ABNORMAL LOW (ref 4.22–5.81)
RDW: 23.3 % — ABNORMAL HIGH (ref 11.5–15.5)
WBC: 15.9 10*3/uL — ABNORMAL HIGH (ref 4.0–10.5)
nRBC: 2.6 % — ABNORMAL HIGH (ref 0.0–0.2)

## 2019-05-29 LAB — PREPARE RBC (CROSSMATCH)

## 2019-05-29 MED ORDER — EPOETIN ALFA-EPBX 10000 UNIT/ML IJ SOLN
20000.0000 [IU] | Freq: Once | INTRAMUSCULAR | Status: AC
  Administered 2019-05-29: 20000 [IU] via SUBCUTANEOUS
  Filled 2019-05-29: qty 2

## 2019-05-29 MED ORDER — DIPHENHYDRAMINE HCL 25 MG PO CAPS: 25.0000 mg | ORAL_CAPSULE | Freq: Once | ORAL | Status: DC

## 2019-05-29 NOTE — Patient Instructions (Signed)

## 2019-05-29 NOTE — Telephone Encounter (Signed)
Received call report from [facility].  "Today's Hgb = 6.7."  Collaborative provided note with results.

## 2019-05-30 ENCOUNTER — Other Ambulatory Visit: Payer: Self-pay

## 2019-05-30 ENCOUNTER — Other Ambulatory Visit: Payer: Medicare Other

## 2019-05-30 ENCOUNTER — Inpatient Hospital Stay: Payer: Medicare Other

## 2019-05-30 ENCOUNTER — Ambulatory Visit: Payer: Medicare Other | Admitting: Oncology

## 2019-05-30 DIAGNOSIS — D649 Anemia, unspecified: Secondary | ICD-10-CM

## 2019-05-30 DIAGNOSIS — N186 End stage renal disease: Secondary | ICD-10-CM | POA: Diagnosis not present

## 2019-05-30 DIAGNOSIS — N189 Chronic kidney disease, unspecified: Secondary | ICD-10-CM

## 2019-05-30 DIAGNOSIS — D631 Anemia in chronic kidney disease: Secondary | ICD-10-CM

## 2019-05-30 MED ORDER — ACETAMINOPHEN 325 MG PO TABS
ORAL_TABLET | ORAL | Status: AC
  Filled 2019-05-30: qty 2

## 2019-05-30 MED ORDER — SODIUM CHLORIDE 0.9% IV SOLUTION
250.0000 mL | Freq: Once | INTRAVENOUS | Status: AC
  Administered 2019-05-30: 250 mL via INTRAVENOUS
  Filled 2019-05-30: qty 250

## 2019-05-30 MED ORDER — DIPHENHYDRAMINE HCL 25 MG PO CAPS
25.0000 mg | ORAL_CAPSULE | Freq: Once | ORAL | Status: AC
  Administered 2019-05-30: 09:00:00 25 mg via ORAL

## 2019-05-30 MED ORDER — SODIUM CHLORIDE 0.9% FLUSH
10.0000 mL | INTRAVENOUS | Status: DC | PRN
  Filled 2019-05-30: qty 10

## 2019-05-30 MED ORDER — SODIUM CHLORIDE 0.9% FLUSH
3.0000 mL | INTRAVENOUS | Status: DC | PRN
  Filled 2019-05-30: qty 10

## 2019-05-30 MED ORDER — DIPHENHYDRAMINE HCL 25 MG PO CAPS
ORAL_CAPSULE | ORAL | Status: AC
  Filled 2019-05-30: qty 1

## 2019-05-30 MED ORDER — HEPARIN SOD (PORK) LOCK FLUSH 100 UNIT/ML IV SOLN
500.0000 [IU] | Freq: Every day | INTRAVENOUS | Status: DC | PRN
  Filled 2019-05-30: qty 5

## 2019-05-30 MED ORDER — HEPARIN SOD (PORK) LOCK FLUSH 100 UNIT/ML IV SOLN
250.0000 [IU] | INTRAVENOUS | Status: DC | PRN
  Filled 2019-05-30: qty 5

## 2019-05-30 MED ORDER — ACETAMINOPHEN 325 MG PO TABS
650.0000 mg | ORAL_TABLET | Freq: Once | ORAL | Status: AC
  Administered 2019-05-30: 650 mg via ORAL

## 2019-05-30 NOTE — Progress Notes (Signed)
Pt received both units of PRBCs originally ordered for today and tomorrow (05/31/2019) in [facility].  Tolerated both well.  Reports feeling better at end of tx.  Able to drink and urinate without any issues.  VSS.  Denies any questions or concerns at time of d/c.  Ambulatory w/steady gait to exit with belongings, spouse here to take him home.

## 2019-05-30 NOTE — Progress Notes (Signed)
Attempted to draw blood and was unsuccessful, called lab and made them aware that the PT needed to have blood drawn.

## 2019-05-30 NOTE — Patient Instructions (Signed)
Blood Transfusion, Adult, Care After This sheet gives you information about how to care for yourself after your procedure. Your doctor may also give you more specific instructions. If you have problems or questions, contact your doctor. Follow these instructions at home:   Take over-the-counter and prescription medicines only as told by your doctor.  Go back to your normal activities as told by your doctor.  Follow instructions from your doctor about how to take care of the area where an IV tube was put into your vein (insertion site). Make sure you: ? Wash your hands with soap and water before you change your bandage (dressing). If there is no soap and water, use hand sanitizer. ? Change your bandage as told by your doctor.  Check your IV insertion site every day for signs of infection. Check for: ? More redness, swelling, or pain. ? More fluid or blood. ? Warmth. ? Pus or a bad smell. Contact a doctor if:  You have more redness, swelling, or pain around the IV insertion site.  You have more fluid or blood coming from the IV insertion site.  Your IV insertion site feels warm to the touch.  You have pus or a bad smell coming from the IV insertion site.  Your pee (urine) turns pink, red, or brown.  You feel weak after doing your normal activities. Get help right away if:  You have signs of a serious allergic or body defense (immune) system reaction, including: ? Itchiness. ? Hives. ? Trouble breathing. ? Anxiety. ? Pain in your chest or lower back. ? Fever, flushing, and chills. ? Fast pulse. ? Rash. ? Watery poop (diarrhea). ? Throwing up (vomiting). ? Dark pee. ? Serious headache. ? Dizziness. ? Stiff neck. ? Yellow color in your face or the white parts of your eyes (jaundice). Summary  After a blood transfusion, return to your normal activities as told by your doctor.  Every day, check for signs of infection where the IV tube was put into your vein.  Some  signs of infection are warm skin, more redness and pain, more fluid or blood, and pus or a bad smell where the needle went in.  Contact your doctor if you feel weak or have any unusual symptoms. This information is not intended to replace advice given to you by your health care provider. Make sure you discuss any questions you have with your health care provider. Document Released: 10/24/2014 Document Revised: 02/07/2018 Document Reviewed: 05/27/2016 Elsevier Patient Education  2020 [employer]  

## 2019-05-31 ENCOUNTER — Inpatient Hospital Stay: Payer: Medicare Other

## 2019-05-31 LAB — TYPE AND SCREEN
ABO/RH(D): A POS
Antibody Screen: NEGATIVE
Unit division: 0
Unit division: 0

## 2019-05-31 LAB — BPAM RBC
Blood Product Expiration Date: 202008312359
Blood Product Expiration Date: 202009072359
ISSUE DATE / TIME: 202008130947
ISSUE DATE / TIME: 202008130947
Unit Type and Rh: 6200
Unit Type and Rh: 6200

## 2019-05-31 LAB — PREPARE RBC (CROSSMATCH)

## 2019-06-04 ENCOUNTER — Telehealth: Payer: Self-pay

## 2019-06-04 NOTE — Telephone Encounter (Signed)
Left vm for pt regarding prescreening questions for appointment on 8/19. ° °

## 2019-06-05 ENCOUNTER — Other Ambulatory Visit: Payer: Self-pay

## 2019-06-05 ENCOUNTER — Encounter: Payer: Self-pay | Admitting: Adult Health

## 2019-06-05 ENCOUNTER — Inpatient Hospital Stay (HOSPITAL_BASED_OUTPATIENT_CLINIC_OR_DEPARTMENT_OTHER): Payer: Medicare Other | Admitting: Adult Health

## 2019-06-05 ENCOUNTER — Inpatient Hospital Stay: Payer: Medicare Other

## 2019-06-05 VITALS — BP 159/88 | HR 68 | Temp 99.8°F | Resp 18 | Ht 72.0 in | Wt 209.9 lb

## 2019-06-05 DIAGNOSIS — D649 Anemia, unspecified: Secondary | ICD-10-CM | POA: Diagnosis not present

## 2019-06-05 DIAGNOSIS — D696 Thrombocytopenia, unspecified: Secondary | ICD-10-CM

## 2019-06-05 DIAGNOSIS — D469 Myelodysplastic syndrome, unspecified: Secondary | ICD-10-CM

## 2019-06-05 DIAGNOSIS — Z940 Kidney transplant status: Secondary | ICD-10-CM

## 2019-06-05 DIAGNOSIS — D61818 Other pancytopenia: Secondary | ICD-10-CM

## 2019-06-05 DIAGNOSIS — N186 End stage renal disease: Secondary | ICD-10-CM | POA: Diagnosis not present

## 2019-06-05 LAB — CBC WITH DIFFERENTIAL/PLATELET
Abs Immature Granulocytes: 5.4 10*3/uL — ABNORMAL HIGH (ref 0.00–0.07)
Basophils Absolute: 0.1 10*3/uL (ref 0.0–0.1)
Basophils Relative: 1 %
Eosinophils Absolute: 0 10*3/uL (ref 0.0–0.5)
Eosinophils Relative: 0 %
HCT: 25.5 % — ABNORMAL LOW (ref 39.0–52.0)
Hemoglobin: 8.2 g/dL — ABNORMAL LOW (ref 13.0–17.0)
Immature Granulocytes: 39 %
Lymphocytes Relative: 16 %
Lymphs Abs: 2.2 10*3/uL (ref 0.7–4.0)
MCH: 32 pg (ref 26.0–34.0)
MCHC: 32.2 g/dL (ref 30.0–36.0)
MCV: 99.6 fL (ref 80.0–100.0)
Monocytes Absolute: 0.5 10*3/uL (ref 0.1–1.0)
Monocytes Relative: 3 %
Neutro Abs: 5.8 10*3/uL (ref 1.7–7.7)
Neutrophils Relative %: 41 %
Platelets: 49 10*3/uL — ABNORMAL LOW (ref 150–400)
RBC: 2.56 MIL/uL — ABNORMAL LOW (ref 4.22–5.81)
RDW: 20.4 % — ABNORMAL HIGH (ref 11.5–15.5)
WBC: 14 10*3/uL — ABNORMAL HIGH (ref 4.0–10.5)
nRBC: 2.7 % — ABNORMAL HIGH (ref 0.0–0.2)

## 2019-06-05 LAB — COMPREHENSIVE METABOLIC PANEL
ALT: 11 U/L (ref 0–44)
AST: 17 U/L (ref 15–41)
Albumin: 3.7 g/dL (ref 3.5–5.0)
Alkaline Phosphatase: 101 U/L (ref 38–126)
Anion gap: 10 (ref 5–15)
BUN: 12 mg/dL (ref 8–23)
CO2: 24 mmol/L (ref 22–32)
Calcium: 10 mg/dL (ref 8.9–10.3)
Chloride: 108 mmol/L (ref 98–111)
Creatinine, Ser: 1.28 mg/dL — ABNORMAL HIGH (ref 0.61–1.24)
GFR calc Af Amer: >60 mL/min (ref >60)
GFR calc non Af Amer: 56 mL/min — ABNORMAL LOW (ref >60)
Glucose, Bld: 122 mg/dL — ABNORMAL HIGH (ref 70–99)
Potassium: 3.9 mmol/L (ref 3.5–5.1)
Sodium: 142 mmol/L (ref 135–145)
Total Bilirubin: 1 mg/dL (ref 0.3–1.2)
Total Protein: 6.9 g/dL (ref 6.5–8.1)

## 2019-06-05 LAB — RETICULOCYTES
Immature Retic Fract: 23.4 % — ABNORMAL HIGH (ref 2.3–15.9)
RBC.: 2.53 MIL/uL — ABNORMAL LOW (ref 4.22–5.81)
Retic Count, Absolute: 63.8 10*3/uL (ref 19.0–186.0)
Retic Ct Pct: 2.5 % (ref 0.4–3.1)

## 2019-06-05 LAB — SAMPLE TO BLOOD BANK

## 2019-06-05 MED ORDER — EPOETIN ALFA-EPBX 10000 UNIT/ML IJ SOLN
20000.0000 [IU] | Freq: Once | INTRAMUSCULAR | Status: AC
  Administered 2019-06-05: 20000 [IU] via SUBCUTANEOUS
  Filled 2019-06-05: qty 2

## 2019-06-05 NOTE — Progress Notes (Signed)
[name]  [phone] [phone]    ID: [name] DOB: [birth year]  MR#: [id number]  IZT#:[id number]  Patient Care Team: Rogers Blocker, MD as PCP - General (Internal Medicine) Magrinat, Virgie Dad, MD as Consulting Physician (Oncology) Donato Heinz, MD as Consulting Physician (Nephrology) Ander Slade, Carlisle Beers, MD as Referring Physician (Ophthalmology) [name] Erskine Squibb, MD as Referring Physician (Vascular Surgery) OTHER MD:   CHIEF COMPLAINT: MDS  CURRENT TREATMENT: Retacrit   HISTORY OF CURRENT ILLNESS: From the original intake note:  [name] is a 73 year old [place] man with a history of type 2 diabetes complicated by significant retinopathy and end-stage renal disease, status post renal transplant, followed by Dr. Marval Regal.  [name] notes that he was feeling more fatigued than usual, and that last time he felt this way, his Vitamin D levels were low. He called his doctors office to ask for his blood work to be ran before he was sent to the ED by Dr. Marlou Sa. The patient presented to the emergency room 03/22/2019 with symptomatic anemia, was found to have hemoglobin of 5.9 and pancytopenia.  Additional work-up is listed below in the assessment section, but there was no evidence of B12, folate or iron deficiency and reticulocyte count was inappropriately normal.  HIV and SARS coronavirus 2 tests were negative.  Creatinine was 1.49 with GFR was 54.  Glucose was 221.  Other labs of interest obtained 03/23/2019 included a B12 level of 944, folate 13.2, ferritin 1060, with serum iron 134, saturation 41%.  The absolute reticulocyte count was 64.5.  His end-stage renal disease is felt to be due to diabetes and hypertension.  He was on dialysis starting 2010 but on 03/31/2015 he underwent a cadaveric renal transplant at [facility].  He had transient CMV viremia, which was treated.  He continues on Bactrim prophylaxis and  tacrolimus.  He also receives mycophenolate and when he had the episode of severe anemia and a mycophenolate was decreased in half and his Bactrim was decreased to Monday Wednesday Friday.  His tacrolimus level was on the high side.  This has been since adjusted by Dr. Marval Regal and is being repeated today  The patient was transfused and discharged and referred here for evaluation of pancytopenia  INTERVAL HISTORY:  [name] is here today for follow up of his MDS.  He was started on Retacrit on 05/22/2019.  He receives this weekly.  He is tolerating this well.  He also received blood last week for a hemoglobin of 6.7.  Today his hemoglobin is 8.2.  He notes that he is feeling improved today.    REVIEW OF SYSTEMS: [name] denies any fever or chills.  His scalp rash has resolved.  He has no nausea, vomiting, bowel or bladder changes.  He is without cough, shortness of breath, chest pain, or palpitations.  A detailed ROS was non contributory.   PAST MEDICAL HISTORY: Past Medical History:  Diagnosis Date   Anemia    hx of at beginnning of dialysis- 05/2009   Arthritis    right hip    End stage renal disease ([place])    [place]    End stage renal disease on dialysis [facility])    Essential hypertension, malignant    Hypertrophy of prostate without urinary obstruction and other lower urinary tract symptoms (LUTS)    Myelodysplasia (myelodysplastic syndrome) ([place]) 05/20/2019   Pure hypercholesterolemia    Renal insufficiency    Type II or  unspecified type diabetes mellitus without mention of complication, not stated as uncontrolled    TYPE 2      PAST SURGICAL HISTORY: Past Surgical History:  Procedure Laterality Date   A/V FISTULAGRAM Left 06/12/2017   Procedure: A/V Fistulagram;  Surgeon: Algernon Huxley, MD;  Location: [place] [facility];  Service: Cardiovascular;  Laterality: Left;   CARDIAC CATHETERIZATION  04/29/10   normal coronaries, LVEF 45%, mild  global LV hypokinesis ([place])   CATARACT EXTRACTION W/PHACO Right 04/03/2013   Procedure: CATARACT EXTRACTION PHACO AND INTRAOCULAR LENS PLACEMENT ([place]);  Surgeon: Adonis Brook, MD;  Location: [facility];  Service: Ophthalmology;  Laterality: Right;   CERVICAL DISCECTOMY  03/1999   COLONOSCOPY     Hx: of   CYSTOSCOPY WITH BIOPSY  11/01/2012   Procedure: CYSTOSCOPY WITH BIOPSY;  Surgeon: Molli Hazard, MD;  Location: [facility];  Service: Urology;  Laterality: N/A;  with fulgeration   CYSTOSCOPY WITH RETROGRADE PYELOGRAM, URETEROSCOPY AND STENT PLACEMENT  11/01/2012   Procedure: CYSTOSCOPY WITH RETROGRADE PYELOGRAM, URETEROSCOPY AND STENT PLACEMENT;  Surgeon: Molli Hazard, MD;  [facility];  Service: Urology;  Laterality: Bilateral;   DIALYSIS FISTULA CREATION  2010   EYE SURGERY  2012   cataracts   EYE SURGERY  11/2014   HOLMIUM LASER APPLICATION  8/93/8101   Procedure: HOLMIUM LASER APPLICATION;  Surgeon: Molli Hazard, MD;  [facility];  Service: Urology;  Laterality: Bilateral;   KIDNEY TRANSPLANT  2016   MEMBRANE PEEL Right 09/04/2013   Procedure: MEMBRANE PEEL;  Surgeon: Adonis Brook, MD;  Location: [facility];  Service: Ophthalmology;  Laterality: Right;   NECK SURGERY  1990   PARS PLANA VITRECTOMY Right 09/04/2013   Procedure: PARS PLANA VITRECTOMY WITH 23 GAUGE;  Surgeon: Adonis Brook, MD;  Location: [place];  Service: Ophthalmology;  Laterality: Right;   PARS PLANA VITRECTOMY Left 02/17/2015   PARS PLANA VITRECTOMY Left 02/17/2015   Procedure: PARS PLANA VITRECTOMY WITH 25G REMOVAL/SUTURE INTRAOCULAR LENS;  Surgeon: Hayden Pedro, MD;  Location: [place];  Service: Ophthalmology;  Laterality: Left;   PERIPHERAL VASCULAR CATHETERIZATION Left 08/08/2016   Procedure: A/V Shuntogram/Fistulagram;  Surgeon: Algernon Huxley, MD;  Location: [place] [facility];  Service: Cardiovascular;  Laterality: Left;   PERIPHERAL VASCULAR CATHETERIZATION N/A  08/08/2016   Procedure: A/V Shunt Intervention;  Surgeon: Algernon Huxley, MD;  Location: [facility] [facility];  Service: Cardiovascular;  Laterality: N/A;   PHOTOCOAGULATION WITH LASER Right 09/04/2013   Procedure: PHOTOCOAGULATION WITH LASER;  Surgeon: Adonis Brook, MD;  Location: [place];  Service: Ophthalmology;  Laterality: Right;  ENDOLASER   TRANSTHORACIC ECHOCARDIOGRAM  06/19/2012   normal LV sys function, EF 55-60%, mild to mod diastolic dysfunction, mild AI ([place])  tonsillectomy @ 73 years of age   FAMILY HISTORY: Family History  Problem Relation Age of Onset   Breast cancer Mother 51       deceased   Pneumonia Father 2       deceased   Hypertension Brother    Diabetes Mellitus II Brother    Cancer Sister        Intestinal   The patient's s father died from pneumonia at age 88.  He was originally a Psychologist, sport and exercise and later worked in a Designer, television/film set.  Patients' mother died from breast cancer at age 61. The patient has 7 siblings. Patient denies anyone in her family having breast, ovarian, prostate, or pancreatic cancer.  1 sister died from intestinal  cancer.  Another sister, Katharine Look, died from an aneurism.     SOCIAL HISTORY: (Current as of 04/22/2019) Mr. Swift retired from the [employer] office. 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 He notes that he was exposed to agent orange. His wife, Keo, is a retired Corporate treasurer. They have two children, Lennette Bihari and Lauren. Lennette Bihari lives in [place] and works at the [employer] for [employer]. Lauren lives in [place] and teaches handicapped children for the [place] school [employer].Kennith Center has two grandchildren.  His daughter is expecting in August 2020. The patient he belongs to [employer] on [place]: In the absence of any documentation, Yifan's spouse is automatically her healthcare power of attorney.     HEALTH MAINTENANCE: Social History   Tobacco Use   Smoking status:  Former Smoker    Types: Cigarettes    Quit date: 10/02/1990    Years since quitting: 28.6   Smokeless tobacco: Never Used  Substance Use Topics   Alcohol use: Yes    Comment: occasional   Drug use: No    Colonoscopy: up to date/ [facility]  Allergies  Allergen Reactions   Codeine Nausea And Vomiting and Nausea Only   Doxycycline Nausea And Vomiting and Nausea Only   Latex Hives and Rash   Tape Other (See Comments)    The "plastic, waffle-printed" tape tears and bruises the skin    Current Outpatient Medications  Medication Sig Dispense Refill   acetaminophen (TYLENOL) 325 MG tablet Take 650 mg by mouth every 6 (six) hours as needed (for pain or headaches).      allopurinol (ZYLOPRIM) 300 MG tablet Take 300 mg by mouth as needed (as directed for gout flares).      amLODipine (NORVASC) 5 MG tablet Take 1 tablet (5 mg total) by mouth daily. 30 tablet 0   donepezil (ARICEPT) 10 MG tablet Take 10 mg by mouth at bedtime.     dorzolamide (TRUSOPT) 2 % ophthalmic solution Place 1 drop into both eyes 2 (two) times a day.      doxazosin (CARDURA) 4 MG tablet Take 4 mg by mouth 2 (two) times daily.     furosemide (LASIX) 40 MG tablet Take 40-80 mg by mouth See admin instructions. Take 80 mg by mouth in the morning and 40 mg in the evening     HUMALOG KWIKPEN 100 UNIT/ML KiwkPen Inject 8-16 Units into the skin See admin instructions. Inject 8-16 units into the skin three times a day before meals, PER SLIDING SCALE (BGL 150 or less, use nothing)  3   Insulin Glargine (LANTUS SOLOSTAR) 100 UNIT/ML Solostar Pen Inject 34 Units into the skin daily before breakfast.      K Phos Mono-Sod Phos Di & Mono (PHOSPHA 250 NEUTRAL) 295-284-132 MG TABS Take 1-2 tablets by mouth See admin instructions. Take 2 tablets by mouth in the morning and 1 tablet at bedtime at 8 PM     ketoconazole (NIZORAL) 2 % shampoo Apply 1 application topically every 7 (seven) days.   2   KLOR-CON M20 20 MEQ tablet  Take 20 mEq by mouth daily.  1   latanoprost (XALATAN) 0.005 % ophthalmic solution Place 1 drop into both eyes at bedtime.      linagliptin (TRADJENTA) 5 MG TABS tablet Take 5 mg by mouth daily with breakfast.      losartan (COZAAR) 100 MG tablet Take 100 mg by mouth daily.  metoprolol tartrate (LOPRESSOR) 25 MG tablet Take 25 mg by mouth 2 (two) times daily.     Multiple Vitamins-Minerals (ONE-A-DAY MENS 50+ ADVANTAGE) TABS Take 1 tablet by mouth daily with breakfast.     mupirocin cream (BACTROBAN) 2 % Apply 1 application topically 2 (two) times daily. 15 g 0   mycophenolate (MYFORTIC) 180 MG EC tablet Take 1 tablet (180 mg total) by mouth 2 (two) times daily. 60 tablet 0   NON FORMULARY Apply 1 application topically See admin instructions. Skin-So-Soft lotion: Apply as needed to dry sites of skin     prednisoLONE acetate (PRED FORTE) 1 % ophthalmic suspension Apply to eye.     sodium chloride (OCEAN) 0.65 % SOLN nasal spray Place 2 sprays into both nostrils as needed for congestion.      sulfamethoxazole-trimethoprim (BACTRIM) 400-80 MG tablet Take 1 tablet by mouth every Monday, Wednesday, and Friday.     tacrolimus (PROGRAF) 0.5 MG capsule Take 0.5 mg by mouth 2 (two) times daily.     tacrolimus (PROGRAF) 1 MG capsule Take 3 mg by mouth 2 (two) times daily.   11   tadalafil (CIALIS) 20 MG tablet Take 20 mg by mouth daily as needed for erectile dysfunction.      Vitamin D, Ergocalciferol, (DRISDOL) 50000 units CAPS capsule Take 50,000 Units by mouth every 7 (seven) days.      No current facility-administered medications for this visit.      OBJECTIVE: Older African-American man who appears stated age  2:   06/05/19 1049  BP: (!) 159/88  Pulse: 68  Resp: 18  Temp: 99.8 F (37.7 C)  SpO2: 100%     Body mass index is 28.47 kg/m.   Wt Readings from Last 3 Encounters:  06/05/19 209 lb 14.4 oz (95.2 kg)  05/30/19 221 lb 11.2 oz (100.6 kg)  05/20/19 218 lb 3.2  oz (99 kg)      ECOG FS:2 - Symptomatic, <50% confined to bed   GENERAL: Patient is a well appearing older male in no acute distress HEENT:  Sclerae anicteric.  Oropharynx clear and moist. No ulcerations or evidence of oropharyngeal candidiasis. Neck is supple.  NODES:  No cervical, supraclavicular, or axillary lymphadenopathy palpated.  LUNGS:  Clear to auscultation bilaterally.  No wheezes or rhonchi. HEART:  Regular rate and rhythm. No murmur appreciated. ABDOMEN:  Soft, nontender.  Positive, normoactive bowel sounds. No organomegaly palpated. MSK:  No focal spinal tenderness to palpation. Full range of motion bilaterally in the upper extremities. EXTREMITIES:  No peripheral edema.   SKIN:  Scalp rash resolved.  No other rashes or lesions.  No nail dyscrasia NEURO:  Nonfocal. Well oriented.  Appropriate affect.   LAB RESULTS: On 04/04/2019 per Dr. Elissa Hefty note the patient white cell count was 4.5, with neutrophils 2.4, hemoglobin was 6.6, MCV 101, and platelets 41,000.  CMP     Component Value Date/Time   NA 142 06/05/2019 1026   NA 135 (L) 07/04/2013 1256   K 3.9 06/05/2019 1026   K 4.4 07/11/2013 1108   CL 108 06/05/2019 1026   CL 96 (L) 07/04/2013 1256   CO2 24 06/05/2019 1026   CO2 32 07/04/2013 1256   GLUCOSE 122 (H) 06/05/2019 1026   GLUCOSE 189 (H) 07/04/2013 1256   BUN 12 06/05/2019 1026   BUN 26 (H) 07/04/2013 1256   CREATININE 1.28 (H) 06/05/2019 1026   CREATININE 1.35 (H) 04/26/2019 1455   CREATININE 10.86 (H) 07/04/2013 1256  CALCIUM 10.0 06/05/2019 1026   CALCIUM 8.8 07/04/2013 1256   PROT 6.9 06/05/2019 1026   ALBUMIN 3.7 06/05/2019 1026   AST 17 06/05/2019 1026   AST 23 04/26/2019 1455   ALT 11 06/05/2019 1026   ALT 19 04/26/2019 1455   ALKPHOS 101 06/05/2019 1026   BILITOT 1.0 06/05/2019 1026   BILITOT 0.9 04/26/2019 1455   GFRNONAA 56 (L) 06/05/2019 1026   GFRNONAA 53 (L) 04/26/2019 1455   GFRNONAA 4 (L) 07/04/2013 1256   GFRAA >60  06/05/2019 1026   GFRAA >60 04/26/2019 1455   GFRAA 5 (L) 07/04/2013 1256    No results found for: TOTALPROTELP, ALBUMINELP, A1GS, A2GS, BETS, BETA2SER, GAMS, MSPIKE, SPEI  No results found for: KPAFRELGTCHN, LAMBDASER, KAPLAMBRATIO  Lab Results  Component Value Date   WBC 14.0 (H) 06/05/2019   NEUTROABS 5.8 06/05/2019   HGB 8.2 (L) 06/05/2019   HCT 25.5 (L) 06/05/2019   MCV 99.6 06/05/2019   PLT 49 (L) 06/05/2019    '@LASTCHEMISTRY'$ @  No results found for: LABCA2  No components found for: TWSFKC127  No results for input(s): INR in the last 168 hours.  No results found for: LABCA2  No results found for: NTZ001  No results found for: VCB449  No results found for: QPR916  No results found for: CA2729  No components found for: HGQUANT  No results found for: CEA1 / No results found for: CEA1   No results found for: AFPTUMOR  No results found for: CHROMOGRNA  No results found for: PSA1  Appointment on 06/05/2019  Component Date Value Ref Range Status   Retic Ct Pct 06/05/2019 2.5  0.4 - 3.1 % Final   RBC. 06/05/2019 2.53* 4.22 - 5.81 MIL/uL Final   Retic Count, Absolute 06/05/2019 63.8  19.0 - 186.0 K/uL Final   Immature Retic Fract 06/05/2019 23.4* 2.3 - 15.9 % Final   Performed at [facility], [place] [address], [place], [place] [address]   Sodium 06/05/2019 142  135 - 145 mmol/L Final   Potassium 06/05/2019 3.9  3.5 - 5.1 mmol/L Final   Chloride 06/05/2019 108  98 - 111 mmol/L Final   CO2 06/05/2019 24  22 - 32 mmol/L Final   Glucose, Bld 06/05/2019 122* 70 - 99 mg/dL Final   BUN 06/05/2019 12  8 - 23 mg/dL Final   Creatinine, Ser 06/05/2019 1.28* 0.61 - 1.24 mg/dL Final   Calcium 06/05/2019 10.0  8.9 - 10.3 mg/dL Final   Total Protein 06/05/2019 6.9  6.5 - 8.1 g/dL Final   Albumin 06/05/2019 3.7  3.5 - 5.0 g/dL Final   AST 06/05/2019 17  15 - 41 U/L Final   ALT 06/05/2019 11  0 - 44 U/L Final   Alkaline Phosphatase  06/05/2019 101  38 - 126 U/L Final   Total Bilirubin 06/05/2019 1.0  0.3 - 1.2 mg/dL Final   GFR calc non Af Amer 06/05/2019 56* >60 mL/min Final   GFR calc Af Amer 06/05/2019 >60  >60 mL/min Final   Anion gap 06/05/2019 10  5 - 15 Final   Performed at [facility], [place] [address] [place], [place] [address]   WBC 06/05/2019 14.0* 4.0 - 10.5 K/uL Final   RBC 06/05/2019 2.56* 4.22 - 5.81 MIL/uL Final   Hemoglobin 06/05/2019 8.2* 13.0 - 17.0 g/dL Final   HCT 06/05/2019 25.5* 39.0 - 52.0 % Final   MCV 06/05/2019 99.6  80.0 - 100.0 fL Final  MCH 06/05/2019 32.0  26.0 - 34.0 pg Final   MCHC 06/05/2019 32.2  30.0 - 36.0 g/dL Final   RDW 06/05/2019 20.4* 11.5 - 15.5 % Final   Platelets 06/05/2019 49* 150 - 400 K/uL Final   Comment: Immature Platelet Fraction may be clinically indicated, consider ordering this additional test [id number]    nRBC 06/05/2019 2.7* 0.0 - 0.2 % Final   Neutrophils Relative % 06/05/2019 41  % Final   Neutro Abs 06/05/2019 5.8  1.7 - 7.7 K/uL Final   Lymphocytes Relative 06/05/2019 16  % Final   Lymphs Abs 06/05/2019 2.2  0.7 - 4.0 K/uL Final   Monocytes Relative 06/05/2019 3  % Final   Monocytes Absolute 06/05/2019 0.5  0.1 - 1.0 K/uL Final   Eosinophils Relative 06/05/2019 0  % Final   Eosinophils Absolute 06/05/2019 0.0  0.0 - 0.5 K/uL Final   Basophils Relative 06/05/2019 1  % Final   Basophils Absolute 06/05/2019 0.1  0.0 - 0.1 K/uL Final   Smear Review 06/05/2019 META AND MYELO SEEN   Final   Immature Granulocytes 06/05/2019 39  % Final   Abs Immature Granulocytes 06/05/2019 5.40* 0.00 - 0.07 K/uL Final   Ovalocytes 06/05/2019 PRESENT   Final   Performed at [facility], [place] [address], [place], [place] [address]    (this displays the last labs from the last 3 days)  No results found for: TOTALPROTELP, ALBUMINELP, A1GS, A2GS, BETS, BETA2SER, GAMS, MSPIKE, SPEI (this displays  SPEP labs)  No results found for: KPAFRELGTCHN, LAMBDASER, KAPLAMBRATIO (kappa/lambda light chains)  No results found for: HGBA, HGBA2QUANT, HGBFQUANT, HGBSQUAN (Hemoglobinopathy evaluation)   Lab Results  Component Value Date   LDH 373 (H) 05/22/2019    Lab Results  Component Value Date   IRON 134 03/23/2019   TIBC 326 03/23/2019   IRONPCTSAT 41 (H) 03/23/2019   (Iron and TIBC)  Lab Results  Component Value Date   FERRITIN 3,297 (H) 05/22/2019    Urinalysis    Component Value Date/Time   COLORURINE YELLOW 03/22/2019 1609   APPEARANCEUR CLEAR 03/22/2019 1609   LABSPEC 1.010 03/22/2019 1609   PHURINE 6.0 03/22/2019 1609   GLUCOSEU NEGATIVE 03/22/2019 1609   HGBUR NEGATIVE 03/22/2019 1609   BILIRUBINUR NEGATIVE 03/22/2019 [place] 03/22/2019 1609   PROTEINUR NEGATIVE 03/22/2019 1609   UROBILINOGEN 0.2 08/17/2012 1120   NITRITE NEGATIVE 03/22/2019 1609   LEUKOCYTESUR NEGATIVE 03/22/2019 1609     STUDIES:  Pathology and cytogenetics results discussed with the patient who also received copies today   ELIGIBLE FOR AVAILABLE RESEARCH PROTOCOL: no   ASSESSMENT: 73 y.o. [place] man with a history of end-stage renal disease secondary to hypertension and diabetes, on hemodialysis as of 2010  (a) status post renal transplant at [facility] 03/31/2015  (1) pancytopenia noted 03/22/2019 with fatigue and shortness of breath  (a) smear review confirmed pancytopenia and macrocytosis with hypogranular polys   (b) Bone Marrow Biopsy on 05/02/2019 shows hypercellular marrow with trilineal dysplasia, slightly left shifted myeloid maturation but no increase in blast, mild myelofibrosis  (c) Flow Cytometry shows no increase in CD34 positive blasts and left shifted myeloid maturation   (d) 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   (2) myelodysplasia, as per #1 above  (a) prognosis: low risk (average survival < 3 years  (3)  aranesp to start 05/22/2019  (a) baseline epo level  (b) ferritin 1060 on 03/23/2019  PLAN:  Juron's hemoglobin is 8.2 today.  He is feeling better.  He will receive the retacrit and will return weekly for labs, an injection and possible blood transfusion.  I will see him back in two weeks.  I reviewed with him that with his hemoglobin being improved, that we need to follow his hemoglobin closely, and transfuse if it is less than 7 or if he has increased symptoms.  He is in agreement.    Leiby was recommended to follow the appropriate pandemic precautions.  He knows to call for any questions or concerns that may arise prior to his next appointment with [place].    A total of (20) minutes of face-to-face time was spent with this patient with greater than 50% of that time in counseling and care-coordination.   Wilber Bihari, NP 06/05/19 11:39 AM Medical Oncology and Hematology [facility] [address] [place], [place] [address] Tel. [phone]    Fax. [phone]

## 2019-06-05 NOTE — Patient Instructions (Signed)

## 2019-06-07 ENCOUNTER — Telehealth: Payer: Self-pay | Admitting: Adult Health

## 2019-06-07 NOTE — Telephone Encounter (Signed)
I talk with patient regarding schedule  

## 2019-06-12 ENCOUNTER — Other Ambulatory Visit: Payer: Self-pay | Admitting: *Deleted

## 2019-06-12 ENCOUNTER — Other Ambulatory Visit: Payer: Self-pay

## 2019-06-12 ENCOUNTER — Inpatient Hospital Stay: Payer: Medicare Other

## 2019-06-12 VITALS — BP 182/73 | HR 56 | Temp 97.5°F | Resp 18

## 2019-06-12 DIAGNOSIS — D469 Myelodysplastic syndrome, unspecified: Secondary | ICD-10-CM

## 2019-06-12 DIAGNOSIS — D649 Anemia, unspecified: Secondary | ICD-10-CM

## 2019-06-12 DIAGNOSIS — Z940 Kidney transplant status: Secondary | ICD-10-CM

## 2019-06-12 DIAGNOSIS — D61818 Other pancytopenia: Secondary | ICD-10-CM

## 2019-06-12 DIAGNOSIS — N186 End stage renal disease: Secondary | ICD-10-CM | POA: Diagnosis not present

## 2019-06-12 DIAGNOSIS — D696 Thrombocytopenia, unspecified: Secondary | ICD-10-CM

## 2019-06-12 LAB — COMPREHENSIVE METABOLIC PANEL
ALT: 11 U/L (ref 0–44)
AST: 16 U/L (ref 15–41)
Albumin: 3.6 g/dL (ref 3.5–5.0)
Alkaline Phosphatase: 89 U/L (ref 38–126)
Anion gap: 8 (ref 5–15)
BUN: 16 mg/dL (ref 8–23)
CO2: 25 mmol/L (ref 22–32)
Calcium: 10 mg/dL (ref 8.9–10.3)
Chloride: 108 mmol/L (ref 98–111)
Creatinine, Ser: 1.49 mg/dL — ABNORMAL HIGH (ref 0.61–1.24)
GFR calc Af Amer: 54 mL/min — ABNORMAL LOW (ref >60)
GFR calc non Af Amer: 47 mL/min — ABNORMAL LOW (ref >60)
Glucose, Bld: 167 mg/dL — ABNORMAL HIGH (ref 70–99)
Potassium: 3.8 mmol/L (ref 3.5–5.1)
Sodium: 141 mmol/L (ref 135–145)
Total Bilirubin: 0.8 mg/dL (ref 0.3–1.2)
Total Protein: 6.6 g/dL (ref 6.5–8.1)

## 2019-06-12 LAB — CBC WITH DIFFERENTIAL/PLATELET
Abs Immature Granulocytes: 4.68 10*3/uL — ABNORMAL HIGH (ref 0.00–0.07)
Basophils Absolute: 0.1 10*3/uL (ref 0.0–0.1)
Basophils Relative: 1 %
Eosinophils Absolute: 0 10*3/uL (ref 0.0–0.5)
Eosinophils Relative: 0 %
HCT: 21.6 % — ABNORMAL LOW (ref 39.0–52.0)
Hemoglobin: 7 g/dL — ABNORMAL LOW (ref 13.0–17.0)
Immature Granulocytes: 33 %
Lymphocytes Relative: 13 %
Lymphs Abs: 1.8 10*3/uL (ref 0.7–4.0)
MCH: 32.6 pg (ref 26.0–34.0)
MCHC: 32.4 g/dL (ref 30.0–36.0)
MCV: 100.5 fL — ABNORMAL HIGH (ref 80.0–100.0)
Monocytes Absolute: 0.9 10*3/uL (ref 0.1–1.0)
Monocytes Relative: 6 %
Neutro Abs: 6.9 10*3/uL (ref 1.7–7.7)
Neutrophils Relative %: 47 %
Platelets: 43 10*3/uL — ABNORMAL LOW (ref 150–400)
RBC: 2.15 MIL/uL — ABNORMAL LOW (ref 4.22–5.81)
RDW: 20.5 % — ABNORMAL HIGH (ref 11.5–15.5)
WBC: 14.4 10*3/uL — ABNORMAL HIGH (ref 4.0–10.5)
nRBC: 1.1 % — ABNORMAL HIGH (ref 0.0–0.2)

## 2019-06-12 LAB — PREPARE RBC (CROSSMATCH)

## 2019-06-12 LAB — SAMPLE TO BLOOD BANK

## 2019-06-12 LAB — RETICULOCYTES
Immature Retic Fract: 17.2 % — ABNORMAL HIGH (ref 2.3–15.9)
RBC.: 2.16 MIL/uL — ABNORMAL LOW (ref 4.22–5.81)
Retic Count, Absolute: 57 10*3/uL (ref 19.0–186.0)
Retic Ct Pct: 2.6 % (ref 0.4–3.1)

## 2019-06-12 LAB — LACTATE DEHYDROGENASE: LDH: 291 U/L — ABNORMAL HIGH (ref 98–192)

## 2019-06-12 LAB — FERRITIN: Ferritin: 2992 ng/mL — ABNORMAL HIGH (ref 24–336)

## 2019-06-12 MED ORDER — SODIUM CHLORIDE 0.9% IV SOLUTION
250.0000 mL | Freq: Once | INTRAVENOUS | Status: AC
  Administered 2019-06-12: 250 mL via INTRAVENOUS
  Filled 2019-06-12: qty 250

## 2019-06-12 MED ORDER — ACETAMINOPHEN 325 MG PO TABS
ORAL_TABLET | ORAL | Status: AC
  Filled 2019-06-12: qty 2

## 2019-06-12 MED ORDER — ACETAMINOPHEN 325 MG PO TABS
650.0000 mg | ORAL_TABLET | Freq: Once | ORAL | Status: AC
  Administered 2019-06-12: 650 mg via ORAL

## 2019-06-12 MED ORDER — DIPHENHYDRAMINE HCL 25 MG PO CAPS
25.0000 mg | ORAL_CAPSULE | Freq: Once | ORAL | Status: AC
  Administered 2019-06-12: 25 mg via ORAL

## 2019-06-12 MED ORDER — DIPHENHYDRAMINE HCL 25 MG PO CAPS
ORAL_CAPSULE | ORAL | Status: AC
  Filled 2019-06-12: qty 1

## 2019-06-12 MED ORDER — EPOETIN ALFA-EPBX 10000 UNIT/ML IJ SOLN
20000.0000 [IU] | Freq: Once | INTRAMUSCULAR | Status: AC
  Administered 2019-06-12: 20000 [IU] via SUBCUTANEOUS
  Filled 2019-06-12: qty 2

## 2019-06-12 NOTE — Patient Instructions (Signed)
Epoetin Alfa injection What is this medicine? EPOETIN ALFA (e POE e tin AL fa) helps your body make more red blood cells. This medicine is used to treat anemia caused by chronic kidney disease, cancer chemotherapy, or HIV-therapy. It may also be used before surgery if you have anemia. This medicine may be used for other purposes; ask your health care provider or pharmacist if you have questions. COMMON BRAND NAME(S): Epogen, Procrit, Retacrit What should I tell my health care provider before I take this medicine? They need to know if you have any of these conditions:  cancer  heart disease  high blood pressure  history of blood clots  history of stroke  low levels of folate, iron, or vitamin B12 in the blood  seizures  an unusual or allergic reaction to erythropoietin, albumin, benzyl alcohol, hamster proteins, other medicines, foods, dyes, or preservatives  pregnant or trying to get pregnant  breast-feeding How should I use this medicine? This medicine is for injection into a vein or under the skin. It is usually given by a health care professional in a hospital or clinic setting. If you get this medicine at home, you will be taught how to prepare and give this medicine. Use exactly as directed. Take your medicine at regular intervals. Do not take your medicine more often than directed. It is important that you put your used needles and syringes in a special sharps container. Do not put them in a trash can. If you do not have a sharps container, call your pharmacist or healthcare provider to get one. A special MedGuide will be given to you by the pharmacist with each prescription and refill. Be sure to read this information carefully each time. Talk to your pediatrician regarding the use of this medicine in children. While this drug may be prescribed for selected conditions, precautions do apply. Overdosage: If you think you have taken too much of this medicine contact a poison  control center or emergency room at once. NOTE: This medicine is only for you. Do not share this medicine with others. What if I miss a dose? If you miss a dose, take it as soon as you can. If it is almost time for your next dose, take only that dose. Do not take double or extra doses. What may interact with this medicine? Interactions have not been studied. This list may not describe all possible interactions. Give your health care provider a list of all the medicines, herbs, non-prescription drugs, or dietary supplements you use. Also tell them if you smoke, drink alcohol, or use illegal drugs. Some items may interact with your medicine. What should I watch for while using this medicine? Your condition will be monitored carefully while you are receiving this medicine. You may need blood work done while you are taking this medicine. This medicine may cause a decrease in vitamin B6. You should make sure that you get enough vitamin B6 while you are taking this medicine. Discuss the foods you eat and the vitamins you take with your health care professional. What side effects may I notice from receiving this medicine? Side effects that you should report to your doctor or health care professional as soon as possible:  allergic reactions like skin rash, itching or hives, swelling of the face, lips, or tongue  seizures  signs and symptoms of a blood clot such as breathing problems; changes in vision; chest pain; severe, sudden headache; pain, swelling, warmth in the leg; trouble speaking; sudden numbness or   weakness of the face, arm or leg  signs and symptoms of a stroke like changes in vision; confusion; trouble speaking or understanding; severe headaches; sudden numbness or weakness of the face, arm or leg; trouble walking; dizziness; loss of balance or coordination Side effects that usually do not require medical attention (report to your doctor or health care professional if they continue or are  bothersome):  chills  cough  dizziness  fever  headaches  joint pain  muscle cramps  muscle pain  nausea, vomiting  pain, redness, or irritation at site where injected This list may not describe all possible side effects. Call your doctor for medical advice about side effects. You may report side effects to FDA at 1-800-FDA-1088. Where should I keep my medicine? Keep out of the reach of children. Store in a refrigerator between 2 and 8 degrees C (36 and 46 degrees F). Do not freeze or shake. Throw away any unused portion if using a single-dose vial. Multi-dose vials can be kept in the refrigerator for up to 21 days after the initial dose. Throw away unused medicine. NOTE: This sheet is a summary. It may not cover all possible information. If you have questions about this medicine, talk to your doctor, pharmacist, or health care provider.  2020 Elsevier/Gold Standard (2017-05-12 08:35:19) Blood Transfusion, Adult, Care After This sheet gives you information about how to care for yourself after your procedure. Your health care provider may also give you more specific instructions. If you have problems or questions, contact your health care provider. What can I expect after the procedure? After your procedure, it is common to have:  Bruising and soreness where the IV tube was inserted.  Headache. Follow these instructions at home:   Take over-the-counter and prescription medicines only as told by your health care provider.  Return to your normal activities as told by your health care provider.  Follow instructions from your health care provider about how to take care of your IV insertion site. Make sure you: ? Wash your hands with soap and water before you change your bandage (dressing). If soap and water are not available, use hand sanitizer. ? Change your dressing as told by your health care provider.  Check your IV insertion site every day for signs of infection. Check  for: ? More redness, swelling, or pain. ? More fluid or blood. ? Warmth. ? Pus or a bad smell. Contact a health care provider if:  You have more redness, swelling, or pain around the IV insertion site.  You have more fluid or blood coming from the IV insertion site.  Your IV insertion site feels warm to the touch.  You have pus or a bad smell coming from the IV insertion site.  Your urine turns pink, red, or brown.  You feel weak after doing your normal activities. Get help right away if:  You have signs of a serious allergic or immune system reaction, including: ? Itchiness. ? Hives. ? Trouble breathing. ? Anxiety. ? Chest or lower back pain. ? Fever, flushing, and chills. ? Rapid pulse. ? Rash. ? Diarrhea. ? Vomiting. ? Dark urine. ? Serious headache. ? Dizziness. ? Stiff neck. ? Yellow coloration of the face or the white parts of the eyes (jaundice). This information is not intended to replace advice given to you by your health care provider. Make sure you discuss any questions you have with your health care provider. Document Released: 10/24/2014 Document Revised: 07/31/2017 Document Reviewed: 04/18/2016 Elsevier Patient  Education  2020 [employer] Inc.  

## 2019-06-13 LAB — TYPE AND SCREEN
ABO/RH(D): A POS
Antibody Screen: NEGATIVE
Unit division: 0

## 2019-06-13 LAB — BPAM RBC
Blood Product Expiration Date: 202009212359
ISSUE DATE / TIME: 202008261627
Unit Type and Rh: 6200

## 2019-06-16 ENCOUNTER — Encounter: Payer: Self-pay | Admitting: Oncology

## 2019-06-16 ENCOUNTER — Other Ambulatory Visit: Payer: Self-pay | Admitting: Oncology

## 2019-06-16 NOTE — Progress Notes (Signed)
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 2% Cytogenetic Category: Intermediate Platelets (x 10^9/L): < 50 Absolute Neutrophil Count (x 10^9/L): ? 0.8 Line of Therapy: First Line IPSS-R Risk Category: Intermediate IPSS-R Risk Score: 4.5 Check here if patient's risk score was calculated prior to the International Prognostic Scoring System-Revised (IPSS-R): false Hemoglobin (g/dl): < 8 Patient Characteristics: Not a Transplant Candidate Intent of Therapy: Non-Curative / Palliative Intent, Discussed with Patient

## 2019-06-16 NOTE — Progress Notes (Signed)
[name] reticulocyte counts are not rising. I am increasing the retacrit dose. 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   In addition, recalculation of his Px using the IPPS-R puts him in the intermediate (not low) risk category

## 2019-06-17 ENCOUNTER — Other Ambulatory Visit: Payer: Self-pay

## 2019-06-17 ENCOUNTER — Ambulatory Visit (INDEPENDENT_AMBULATORY_CARE_PROVIDER_SITE_OTHER): Payer: Medicare Other | Admitting: Podiatry

## 2019-06-17 ENCOUNTER — Encounter: Payer: Self-pay | Admitting: Podiatry

## 2019-06-17 DIAGNOSIS — M79674 Pain in right toe(s): Secondary | ICD-10-CM | POA: Diagnosis not present

## 2019-06-17 DIAGNOSIS — L84 Corns and callosities: Secondary | ICD-10-CM | POA: Diagnosis not present

## 2019-06-17 DIAGNOSIS — E119 Type 2 diabetes mellitus without complications: Secondary | ICD-10-CM

## 2019-06-17 DIAGNOSIS — M79675 Pain in left toe(s): Secondary | ICD-10-CM

## 2019-06-17 DIAGNOSIS — Z794 Long term (current) use of insulin: Secondary | ICD-10-CM

## 2019-06-17 DIAGNOSIS — B351 Tinea unguium: Secondary | ICD-10-CM

## 2019-06-17 NOTE — Patient Instructions (Signed)
Diabetes Mellitus and Foot Care Foot care is an important part of your health, especially when you have diabetes. Diabetes may cause you to have problems because of poor blood flow (circulation) to your feet and legs, which can cause your skin to:  Become thinner and drier.  Break more easily.  Heal more slowly.  Peel and crack. You may also have nerve damage (neuropathy) in your legs and feet, causing decreased feeling in them. This means that you may not notice minor injuries to your feet that could lead to more serious problems. Noticing and addressing any potential problems early is the best way to prevent future foot problems. How to care for your feet Foot hygiene  Wash your feet daily with warm water and mild soap. Do not use hot water. Then, pat your feet and the areas between your toes until they are completely dry. Do not soak your feet as this can dry your skin.  Trim your toenails straight across. Do not dig under them or around the cuticle. File the edges of your nails with an emery board or nail file.  Apply a moisturizing lotion or petroleum jelly to the skin on your feet and to dry, brittle toenails. Use lotion that does not contain alcohol and is unscented. Do not apply lotion between your toes. Shoes and socks  Wear clean socks or stockings every day. Make sure they are not too tight. Do not wear knee-high stockings since they may decrease blood flow to your legs.  Wear shoes that fit properly and have enough cushioning. Always look in your shoes before you put them on to be sure there are no objects inside.  To break in new shoes, wear them for just a few hours a day. This prevents injuries on your feet. Wounds, scrapes, corns, and calluses  Check your feet daily for blisters, cuts, bruises, sores, and redness. If you cannot see the bottom of your feet, use a mirror or ask someone for help.  Do not cut corns or calluses or try to remove them with medicine.  If you  find a minor scrape, cut, or break in the skin on your feet, keep it and the skin around it clean and dry. You may clean these areas with mild soap and water. Do not clean the area with peroxide, alcohol, or iodine.  If you have a wound, scrape, corn, or callus on your foot, look at it several times a day to make sure it is healing and not infected. Check for: ? Redness, swelling, or pain. ? Fluid or blood. ? Warmth. ? Pus or a bad smell. General instructions  Do not cross your legs. This may decrease blood flow to your feet.  Do not use heating pads or hot water bottles on your feet. They may burn your skin. If you have lost feeling in your feet or legs, you may not know this is happening until it is too late.  Protect your feet from hot and cold by wearing shoes, such as at the beach or on hot pavement.  Schedule a complete foot exam at least once a year (annually) or more often if you have foot problems. If you have foot problems, report any cuts, sores, or bruises to your health care provider immediately. Contact a health care provider if:  You have a medical condition that increases your risk of infection and you have any cuts, sores, or bruises on your feet.  You have an injury that is not   healing.  You have redness on your legs or feet.  You feel burning or tingling in your legs or feet.  You have pain or cramps in your legs and feet.  Your legs or feet are numb.  Your feet always feel cold.  You have pain around a toenail. Get help right away if:  You have a wound, scrape, corn, or callus on your foot and: ? You have pain, swelling, or redness that gets worse. ? You have fluid or blood coming from the wound, scrape, corn, or callus. ? Your wound, scrape, corn, or callus feels warm to the touch. ? You have pus or a bad smell coming from the wound, scrape, corn, or callus. ? You have a fever. ? You have a red line going up your leg. Summary  Check your feet every day  for cuts, sores, red spots, swelling, and blisters.  Moisturize feet and legs daily.  Wear shoes that fit properly and have enough cushioning.  If you have foot problems, report any cuts, sores, or bruises to your health care provider immediately.  Schedule a complete foot exam at least once a year (annually) or more often if you have foot problems. This information is not intended to replace advice given to you by your health care provider. Make sure you discuss any questions you have with your health care provider. Document Released: 09/30/2000 Document Revised: 11/15/2017 Document Reviewed: 11/04/2016 Elsevier Patient Education  2020 [employer] Inc.  Corns and Calluses Corns are small areas of thickened skin that occur on the top, sides, or tip of a toe. They contain a cone-shaped core with a point that can press on a nerve below. This causes pain.  Calluses are areas of thickened skin that can occur anywhere on the body, including the hands, fingers, palms, soles of the feet, and heels. Calluses are usually larger than corns. What are the causes? Corns and calluses are caused by rubbing (friction) or pressure, such as from shoes that are too tight or do not fit properly. What increases the risk? Corns are more likely to develop in people who have misshapen toes (toe deformities), such as hammer toes. Calluses can occur with friction to any area of the skin. They are more likely to develop in people who:  Work with their hands.  Wear shoes that fit poorly, are too tight, or are high-heeled.  Have toe deformities. What are the signs or symptoms? Symptoms of a corn or callus include:  A hard growth on the skin.  Pain or tenderness under the skin.  Redness and swelling.  Increased discomfort while wearing tight-fitting shoes, if your feet are affected. If a corn or callus becomes infected, symptoms may include:  Redness and swelling that gets worse.  Pain.  Fluid, blood, or  pus draining from the corn or callus. How is this diagnosed? Corns and calluses may be diagnosed based on your symptoms, your medical history, and a physical exam. How is this treated? Treatment for corns and calluses may include:  Removing the cause of the friction or pressure. This may involve: ? Changing your shoes. ? Wearing shoe inserts (orthotics) or other protective layers in your shoes, such as a corn pad. ? Wearing gloves.  Applying medicine to the skin (topical medicine) to help soften skin in the hardened, thickened areas.  Removing layers of dead skin with a file to reduce the size of the corn or callus.  Removing the corn or callus with a scalpel or   laser.  Taking antibiotic medicines, if your corn or callus is infected.  Having surgery, if a toe deformity is the cause. Follow these instructions at home:   Take over-the-counter and prescription medicines only as told by your health care provider.  If you were prescribed an antibiotic, take it as told by your health care provider. Do not stop taking it even if your condition starts to improve.  Wear shoes that fit well. Avoid wearing high-heeled shoes and shoes that are too tight or too loose.  Wear any padding, protective layers, gloves, or orthotics as told by your health care provider.  Soak your hands or feet and then use a file or pumice stone to soften your corn or callus. Do this as told by your health care provider.  Check your corn or callus every day for symptoms of infection. Contact a health care provider if you:  Notice that your symptoms do not improve with treatment.  Have redness or swelling that gets worse.  Notice that your corn or callus becomes painful.  Have fluid, blood, or pus coming from your corn or callus.  Have new symptoms. Summary  Corns are small areas of thickened skin that occur on the top, sides, or tip of a toe.  Calluses are areas of thickened skin that can occur anywhere  on the body, including the hands, fingers, palms, and soles of the feet. Calluses are usually larger than corns.  Corns and calluses are caused by rubbing (friction) or pressure, such as from shoes that are too tight or do not fit properly.  Treatment may include wearing any padding, protective layers, gloves, or orthotics as told by your health care provider. This information is not intended to replace advice given to you by your health care provider. Make sure you discuss any questions you have with your health care provider. Document Released: 07/09/2004 Document Revised: 01/23/2019 Document Reviewed: 08/16/2017 Elsevier Patient Education  2020 [employer] Inc.  Onychomycosis/Fungal Toenails  WHAT IS IT? An infection that lies within the keratin of your nail plate that is caused by a fungus.  WHY ME? Fungal infections affect all ages, sexes, races, and creeds.  There may be many factors that predispose you to a fungal infection such as age, coexisting medical conditions such as diabetes, or an autoimmune disease; stress, medications, fatigue, genetics, etc.  Bottom line: fungus thrives in a warm, moist environment and your shoes offer such a location.  IS IT CONTAGIOUS? Theoretically, yes.  You do not want to share shoes, nail clippers or files with someone who has fungal toenails.  Walking around barefoot in the same room or sleeping in the same bed is unlikely to transfer the organism.  It is important to realize, however, that fungus can spread easily from one nail to the next on the same foot.  HOW DO WE TREAT THIS?  There are several ways to treat this condition.  Treatment may depend on many factors such as age, medications, pregnancy, liver and kidney conditions, etc.  It is best to ask your doctor which options are available to you.  1. No treatment.   Unlike many other medical concerns, you can live with this condition.  However for many people this can be a painful condition and may  lead to ingrown toenails or a bacterial infection.  It is recommended that you keep the nails cut short to help reduce the amount of fungal nail. 2. Topical treatment.  These range from herbal remedies to prescription   strength nail lacquers.  About 40-50% effective, topicals require twice daily application for approximately 9 to 12 months or until an entirely new nail has grown out.  The most effective topicals are medical grade medications available through physicians offices. 3. Oral antifungal medications.  With an 80-90% cure rate, the most common oral medication requires 3 to 4 months of therapy and stays in your system for a year as the new nail grows out.  Oral antifungal medications do require blood work to make sure it is a safe drug for you.  A liver function panel will be performed prior to starting the medication and after the first month of treatment.  It is important to have the blood work performed to avoid any harmful side effects.  In general, this medication safe but blood work is required. 4. Laser Therapy.  This treatment is performed by applying a specialized laser to the affected nail plate.  This therapy is noninvasive, fast, and non-painful.  It is not covered by insurance and is therefore, out of pocket.  The results have been very good with a 80-95% cure rate.  [facility] is the only practice in the area to offer this therapy. 5. Permanent Nail Avulsion.  Removing the entire nail so that a new nail will not grow back. 

## 2019-06-17 NOTE — Progress Notes (Signed)
Subjective: [name] is a 73 y.o. y.o. male who presents today for diabetic foot care with cc of painful, discolored, thick toenails and painful callus/corn which interfere with daily activities. Pain is aggravated when wearing enclosed shoe gear and relieved with periodic professional debridement.  Rogers Blocker, MD is his PCP.    Current Outpatient Medications:  .  acetaminophen (TYLENOL) 325 MG tablet, Take 650 mg by mouth every 6 (six) hours as needed (for pain or headaches). , Disp: , Rfl:  .  allopurinol (ZYLOPRIM) 300 MG tablet, Take 300 mg by mouth as needed (as directed for gout flares). , Disp: , Rfl:  .  amLODipine (NORVASC) 5 MG tablet, Take 1 tablet (5 mg total) by mouth daily., Disp: 30 tablet, Rfl: 0 .  betamethasone dipropionate (DIPROLENE) 0.05 % ointment, , Disp: , Rfl:  .  cloNIDine (CATAPRES) 0.1 MG tablet, Take 0.1 mg by mouth 3 (three) times daily., Disp: , Rfl:  .  donepezil (ARICEPT) 10 MG tablet, Take 10 mg by mouth at bedtime., Disp: , Rfl:  .  dorzolamide (TRUSOPT) 2 % ophthalmic solution, Place 1 drop into both eyes 2 (two) times a day. , Disp: , Rfl:  .  doxazosin (CARDURA) 4 MG tablet, Take 4 mg by mouth 2 (two) times daily., Disp: , Rfl:  .  furosemide (LASIX) 40 MG tablet, Take 40-80 mg by mouth See admin instructions. Take 80 mg by mouth in the morning and 40 mg in the evening, Disp: , Rfl:  .  HUMALOG KWIKPEN 100 UNIT/ML KiwkPen, Inject 8-16 Units into the skin See admin instructions. Inject 8-16 units into the skin three times a day before meals, PER SLIDING SCALE (BGL 150 or less, use nothing), Disp: , Rfl: 3 .  Insulin Glargine (LANTUS SOLOSTAR) 100 UNIT/ML Solostar Pen, Inject 34 Units into the skin daily before breakfast. , Disp: , Rfl:  .  K Phos Mono-Sod Phos Di & Mono (PHOSPHA 250 NEUTRAL) 155-852-130 MG TABS, Take 1-2 tablets by mouth See admin instructions. Take 2 tablets by mouth in the morning and 1 tablet at bedtime at 8 PM, Disp: , Rfl:  .   ketoconazole (NIZORAL) 2 % shampoo, Apply 1 application topically every 7 (seven) days. , Disp: , Rfl: 2 .  KLOR-CON M20 20 MEQ tablet, Take 20 mEq by mouth daily., Disp: , Rfl: 1 .  latanoprost (XALATAN) 0.005 % ophthalmic solution, Place 1 drop into both eyes at bedtime. , Disp: , Rfl:  .  linagliptin (TRADJENTA) 5 MG TABS tablet, Take 5 mg by mouth daily with breakfast. , Disp: , Rfl:  .  losartan (COZAAR) 100 MG tablet, Take 100 mg by mouth daily., Disp: , Rfl:  .  metoprolol tartrate (LOPRESSOR) 25 MG tablet, Take 25 mg by mouth 2 (two) times daily., Disp: , Rfl:  .  Multiple Vitamins-Minerals (ONE-A-DAY MENS 50+ ADVANTAGE) TABS, Take 1 tablet by mouth daily with breakfast., Disp: , Rfl:  .  mupirocin cream (BACTROBAN) 2 %, Apply 1 application topically 2 (two) times daily., Disp: 15 g, Rfl: 0 .  mycophenolate (MYFORTIC) 180 MG EC tablet, Take 1 tablet (180 mg total) by mouth 2 (two) times daily., Disp: 60 tablet, Rfl: 0 .  NON FORMULARY, Apply 1 application topically See admin instructions. Skin-So-Soft lotion: Apply as needed to dry sites of skin, Disp: , Rfl:  .  sodium chloride (OCEAN) 0.65 % SOLN nasal spray, Place 2 sprays into both nostrils as needed for congestion. , Disp: ,  Rfl:  .  sulfamethoxazole-trimethoprim (BACTRIM) 400-80 MG tablet, Take 1 tablet by mouth every Monday, Wednesday, and Friday., Disp: , Rfl:  .  tacrolimus (PROGRAF) 0.5 MG capsule, Take 0.5 mg by mouth 2 (two) times daily., Disp: , Rfl:  .  tacrolimus (PROGRAF) 1 MG capsule, Take 3 mg by mouth 2 (two) times daily. , Disp: , Rfl: 11 .  tadalafil (CIALIS) 20 MG tablet, Take 20 mg by mouth daily as needed for erectile dysfunction. , Disp: , Rfl:  .  VIGAMOX 0.5 % ophthalmic solution, INSTILL 1 DROP INTO RIGHT EYE 4 TIMES A DAY, Disp: , Rfl:  .  Vitamin D, Ergocalciferol, (DRISDOL) 50000 units CAPS capsule, Take 50,000 Units by mouth every 7 (seven) days. , Disp: , Rfl:   Allergies  Allergen Reactions  . Codeine  Nausea And Vomiting and Nausea Only  . Doxycycline Nausea And Vomiting and Nausea Only  . Latex Hives and Rash  . Tape Other (See Comments)    The "plastic, waffle-printed" tape tears and bruises the skin    Objective:  Vascular Examination: Capillary refill time <3 seconds x 10 digits.  Dorsalis pedis pulses palpable b/l.  Posterior tibial pulses palpable b/l.  Digital hair absent x 10 digits.  Skin temperature gradient WNL b/l.  Dermatological Examination: Skin with normal turgor, texture and tone b/l.  Toenails 1-5 b/l discolored, thick, dystrophic with subungual debris and pain with palpation to nailbeds due to thickness of nails.  Hyperkeratotic lesions distal tip left 2nd digit, right 3rd digit. No erythema, no edema, no drainage, no flocculence noted.   Musculoskeletal: Muscle strength 5/5 to all LE muscle groups.  HAV with bunion b/l.  Neurological: Sensation intact 5/5 b/l with 10 gram monofilament.  Vibratory sensation intact b/l.  Assessment: 1. Painful onychomycosis toenails 1-5 b/l 2.  Corn left 2nd digit, right 3rd digit 3.  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 Continue diabetic foot care principles. Literature dispensed on today. 2. Toenails 1-5 b/l were debrided in length and girth without iatrogenic bleeding. 3. Hyperkeratotic lesions left 2nd, right 3rd digit pared with sterile scalpel blade without incident. 4. Patient to continue soft, supportive shoe gear daily. 5. Patient to report any pedal injuries to medical professional immediately. 6. Follow up 3 months.  7. Patient/POA to call should there be a concern in the interim.

## 2019-06-19 ENCOUNTER — Encounter: Payer: Self-pay | Admitting: Adult Health

## 2019-06-19 ENCOUNTER — Inpatient Hospital Stay: Payer: Medicare Other

## 2019-06-19 ENCOUNTER — Inpatient Hospital Stay: Payer: Medicare Other | Attending: Oncology

## 2019-06-19 ENCOUNTER — Other Ambulatory Visit: Payer: Medicare Other

## 2019-06-19 ENCOUNTER — Inpatient Hospital Stay (HOSPITAL_BASED_OUTPATIENT_CLINIC_OR_DEPARTMENT_OTHER): Payer: Medicare Other | Admitting: Adult Health

## 2019-06-19 ENCOUNTER — Other Ambulatory Visit: Payer: Self-pay

## 2019-06-19 ENCOUNTER — Ambulatory Visit: Payer: Medicare Other

## 2019-06-19 VITALS — BP 172/75 | HR 56 | Temp 98.0°F | Resp 18

## 2019-06-19 VITALS — BP 142/59 | HR 66 | Temp 98.5°F | Resp 18 | Ht 72.0 in | Wt 214.1 lb

## 2019-06-19 DIAGNOSIS — Z23 Encounter for immunization: Secondary | ICD-10-CM | POA: Diagnosis not present

## 2019-06-19 DIAGNOSIS — D469 Myelodysplastic syndrome, unspecified: Secondary | ICD-10-CM | POA: Insufficient documentation

## 2019-06-19 DIAGNOSIS — E1122 Type 2 diabetes mellitus with diabetic chronic kidney disease: Secondary | ICD-10-CM | POA: Diagnosis not present

## 2019-06-19 DIAGNOSIS — Z803 Family history of malignant neoplasm of breast: Secondary | ICD-10-CM | POA: Insufficient documentation

## 2019-06-19 DIAGNOSIS — Z87891 Personal history of nicotine dependence: Secondary | ICD-10-CM | POA: Diagnosis not present

## 2019-06-19 DIAGNOSIS — D631 Anemia in chronic kidney disease: Secondary | ICD-10-CM | POA: Diagnosis not present

## 2019-06-19 DIAGNOSIS — Z940 Kidney transplant status: Secondary | ICD-10-CM

## 2019-06-19 DIAGNOSIS — N186 End stage renal disease: Secondary | ICD-10-CM | POA: Insufficient documentation

## 2019-06-19 DIAGNOSIS — Z79899 Other long term (current) drug therapy: Secondary | ICD-10-CM | POA: Insufficient documentation

## 2019-06-19 DIAGNOSIS — D649 Anemia, unspecified: Secondary | ICD-10-CM

## 2019-06-19 DIAGNOSIS — Z5111 Encounter for antineoplastic chemotherapy: Secondary | ICD-10-CM | POA: Insufficient documentation

## 2019-06-19 DIAGNOSIS — I120 Hypertensive chronic kidney disease with stage 5 chronic kidney disease or end stage renal disease: Secondary | ICD-10-CM | POA: Diagnosis not present

## 2019-06-19 DIAGNOSIS — D61818 Other pancytopenia: Secondary | ICD-10-CM | POA: Diagnosis not present

## 2019-06-19 LAB — COMPREHENSIVE METABOLIC PANEL
ALT: 13 U/L (ref 0–44)
AST: 16 U/L (ref 15–41)
Albumin: 3.8 g/dL (ref 3.5–5.0)
Alkaline Phosphatase: 91 U/L (ref 38–126)
Anion gap: 9 (ref 5–15)
BUN: 16 mg/dL (ref 8–23)
CO2: 24 mmol/L (ref 22–32)
Calcium: 10 mg/dL (ref 8.9–10.3)
Chloride: 108 mmol/L (ref 98–111)
Creatinine, Ser: 1.27 mg/dL — ABNORMAL HIGH (ref 0.61–1.24)
GFR calc Af Amer: >60 mL/min (ref >60)
GFR calc non Af Amer: 57 mL/min — ABNORMAL LOW (ref >60)
Glucose, Bld: 153 mg/dL — ABNORMAL HIGH (ref 70–99)
Potassium: 3.8 mmol/L (ref 3.5–5.1)
Sodium: 141 mmol/L (ref 135–145)
Total Bilirubin: 0.8 mg/dL (ref 0.3–1.2)
Total Protein: 6.9 g/dL (ref 6.5–8.1)

## 2019-06-19 LAB — CBC WITH DIFFERENTIAL/PLATELET
Abs Immature Granulocytes: 2.8 10*3/uL — ABNORMAL HIGH (ref 0.00–0.07)
Basophils Absolute: 0 10*3/uL (ref 0.0–0.1)
Basophils Relative: 1 %
Eosinophils Absolute: 0 10*3/uL (ref 0.0–0.5)
Eosinophils Relative: 0 %
HCT: 22.3 % — ABNORMAL LOW (ref 39.0–52.0)
Hemoglobin: 7.1 g/dL — ABNORMAL LOW (ref 13.0–17.0)
Immature Granulocytes: 32 %
Lymphocytes Relative: 19 %
Lymphs Abs: 1.7 10*3/uL (ref 0.7–4.0)
MCH: 30.7 pg (ref 26.0–34.0)
MCHC: 31.8 g/dL (ref 30.0–36.0)
MCV: 96.5 fL (ref 80.0–100.0)
Monocytes Absolute: 0.7 10*3/uL (ref 0.1–1.0)
Monocytes Relative: 8 %
Neutro Abs: 3.6 10*3/uL (ref 1.7–7.7)
Neutrophils Relative %: 40 %
Platelets: 30 10*3/uL — ABNORMAL LOW (ref 150–400)
RBC: 2.31 MIL/uL — ABNORMAL LOW (ref 4.22–5.81)
RDW: 20.1 % — ABNORMAL HIGH (ref 11.5–15.5)
WBC: 8.7 10*3/uL (ref 4.0–10.5)
nRBC: 1 % — ABNORMAL HIGH (ref 0.0–0.2)

## 2019-06-19 LAB — SAMPLE TO BLOOD BANK

## 2019-06-19 LAB — PREPARE RBC (CROSSMATCH)

## 2019-06-19 MED ORDER — ACETAMINOPHEN 325 MG PO TABS
ORAL_TABLET | ORAL | Status: AC
  Filled 2019-06-19: qty 2

## 2019-06-19 MED ORDER — DIPHENHYDRAMINE HCL 25 MG PO CAPS
ORAL_CAPSULE | ORAL | Status: AC
  Filled 2019-06-19: qty 1

## 2019-06-19 MED ORDER — EPOETIN ALFA-EPBX 40000 UNIT/ML IJ SOLN
40000.0000 [IU] | Freq: Once | INTRAMUSCULAR | Status: AC
  Administered 2019-06-19: 12:00:00 40000 [IU] via SUBCUTANEOUS
  Filled 2019-06-19: qty 1

## 2019-06-19 MED ORDER — SODIUM CHLORIDE 0.9% IV SOLUTION
250.0000 mL | Freq: Once | INTRAVENOUS | Status: AC
  Administered 2019-06-19: 11:00:00 250 mL via INTRAVENOUS
  Filled 2019-06-19: qty 250

## 2019-06-19 MED ORDER — DIPHENHYDRAMINE HCL 25 MG PO CAPS
25.0000 mg | ORAL_CAPSULE | Freq: Once | ORAL | Status: AC
  Administered 2019-06-19: 11:00:00 25 mg via ORAL

## 2019-06-19 MED ORDER — ACETAMINOPHEN 325 MG PO TABS
650.0000 mg | ORAL_TABLET | Freq: Once | ORAL | Status: AC
  Administered 2019-06-19: 11:00:00 650 mg via ORAL

## 2019-06-19 NOTE — Patient Instructions (Signed)
Epoetin Alfa injection What is this medicine? EPOETIN ALFA (e POE e tin AL fa) helps your body make more red blood cells. This medicine is used to treat anemia caused by chronic kidney disease, cancer chemotherapy, or HIV-therapy. It may also be used before surgery if you have anemia. This medicine may be used for other purposes; ask your health care provider or pharmacist if you have questions. COMMON BRAND NAME(S): Epogen, Procrit, Retacrit What should I tell my health care provider before I take this medicine? They need to know if you have any of these conditions:  cancer  heart disease  high blood pressure  history of blood clots  history of stroke  low levels of folate, iron, or vitamin B12 in the blood  seizures  an unusual or allergic reaction to erythropoietin, albumin, benzyl alcohol, hamster proteins, other medicines, foods, dyes, or preservatives  pregnant or trying to get pregnant  breast-feeding How should I use this medicine? This medicine is for injection into a vein or under the skin. It is usually given by a health care professional in a hospital or clinic setting. If you get this medicine at home, you will be taught how to prepare and give this medicine. Use exactly as directed. Take your medicine at regular intervals. Do not take your medicine more often than directed. It is important that you put your used needles and syringes in a special sharps container. Do not put them in a trash can. If you do not have a sharps container, call your pharmacist or healthcare provider to get one. A special MedGuide will be given to you by the pharmacist with each prescription and refill. Be sure to read this information carefully each time. Talk to your pediatrician regarding the use of this medicine in children. While this drug may be prescribed for selected conditions, precautions do apply. Overdosage: If you think you have taken too much of this medicine contact a poison  control center or emergency room at once. NOTE: This medicine is only for you. Do not share this medicine with others. What if I miss a dose? If you miss a dose, take it as soon as you can. If it is almost time for your next dose, take only that dose. Do not take double or extra doses. What may interact with this medicine? Interactions have not been studied. This list may not describe all possible interactions. Give your health care provider a list of all the medicines, herbs, non-prescription drugs, or dietary supplements you use. Also tell them if you smoke, drink alcohol, or use illegal drugs. Some items may interact with your medicine. What should I watch for while using this medicine? Your condition will be monitored carefully while you are receiving this medicine. You may need blood work done while you are taking this medicine. This medicine may cause a decrease in vitamin B6. You should make sure that you get enough vitamin B6 while you are taking this medicine. Discuss the foods you eat and the vitamins you take with your health care professional. What side effects may I notice from receiving this medicine? Side effects that you should report to your doctor or health care professional as soon as possible:  allergic reactions like skin rash, itching or hives, swelling of the face, lips, or tongue  seizures  signs and symptoms of a blood clot such as breathing problems; changes in vision; chest pain; severe, sudden headache; pain, swelling, warmth in the leg; trouble speaking; sudden numbness or   weakness of the face, arm or leg  signs and symptoms of a stroke like changes in vision; confusion; trouble speaking or understanding; severe headaches; sudden numbness or weakness of the face, arm or leg; trouble walking; dizziness; loss of balance or coordination Side effects that usually do not require medical attention (report to your doctor or health care professional if they continue or are  bothersome):  chills  cough  dizziness  fever  headaches  joint pain  muscle cramps  muscle pain  nausea, vomiting  pain, redness, or irritation at site where injected This list may not describe all possible side effects. Call your doctor for medical advice about side effects. You may report side effects to FDA at 1-800-FDA-1088. Where should I keep my medicine? Keep out of the reach of children. Store in a refrigerator between 2 and 8 degrees C (36 and 46 degrees F). Do not freeze or shake. Throw away any unused portion if using a single-dose vial. Multi-dose vials can be kept in the refrigerator for up to 21 days after the initial dose. Throw away unused medicine. NOTE: This sheet is a summary. It may not cover all possible information. If you have questions about this medicine, talk to your doctor, pharmacist, or health care provider.  2020 Elsevier/Gold Standard (2017-05-12 08:35:19) Blood Transfusion, Adult, Care After This sheet gives you information about how to care for yourself after your procedure. Your health care provider may also give you more specific instructions. If you have problems or questions, contact your health care provider. What can I expect after the procedure? After your procedure, it is common to have:  Bruising and soreness where the IV tube was inserted.  Headache. Follow these instructions at home:   Take over-the-counter and prescription medicines only as told by your health care provider.  Return to your normal activities as told by your health care provider.  Follow instructions from your health care provider about how to take care of your IV insertion site. Make sure you: ? Wash your hands with soap and water before you change your bandage (dressing). If soap and water are not available, use hand sanitizer. ? Change your dressing as told by your health care provider.  Check your IV insertion site every day for signs of infection. Check  for: ? More redness, swelling, or pain. ? More fluid or blood. ? Warmth. ? Pus or a bad smell. Contact a health care provider if:  You have more redness, swelling, or pain around the IV insertion site.  You have more fluid or blood coming from the IV insertion site.  Your IV insertion site feels warm to the touch.  You have pus or a bad smell coming from the IV insertion site.  Your urine turns pink, red, or brown.  You feel weak after doing your normal activities. Get help right away if:  You have signs of a serious allergic or immune system reaction, including: ? Itchiness. ? Hives. ? Trouble breathing. ? Anxiety. ? Chest or lower back pain. ? Fever, flushing, and chills. ? Rapid pulse. ? Rash. ? Diarrhea. ? Vomiting. ? Dark urine. ? Serious headache. ? Dizziness. ? Stiff neck. ? Yellow coloration of the face or the white parts of the eyes (jaundice). This information is not intended to replace advice given to you by your health care provider. Make sure you discuss any questions you have with your health care provider. Document Released: 10/24/2014 Document Revised: 07/31/2017 Document Reviewed: 04/18/2016 Elsevier Patient  Education  2020 [employer] Inc.  

## 2019-06-19 NOTE — Progress Notes (Signed)
[facility]  [phone] [phone]    ID: [name] DOB: 02/09/1946  MR#: [id number]  RDE#:[id number]  Patient Care Team: Rogers Blocker, MD as PCP - General (Internal Medicine) Magrinat, Virgie Dad, MD as Consulting Physician (Oncology) Donato Heinz, MD as Consulting Physician (Nephrology) Ander Slade, Carlisle Beers, MD as Referring Physician (Ophthalmology) [name] Erskine Squibb, MD as Referring Physician (Vascular Surgery) OTHER MD:   CHIEF COMPLAINT: MDS  CURRENT TREATMENT: Retacrit   HISTORY OF CURRENT ILLNESS: From the original intake note:  [name] is a 73 year old [place] man with a history of type 2 diabetes complicated by significant retinopathy and end-stage renal disease, status post renal transplant, followed by Dr. Marval Regal.  Giovanelli notes that he was feeling more fatigued than usual, and that last time he felt this way, his Vitamin D levels were low. He called his doctors office to ask for his blood work to be ran before he was sent to the ED by Dr. Marlou Sa. The patient presented to the emergency room 03/22/2019 with symptomatic anemia, was found to have hemoglobin of 5.9 and pancytopenia.  Additional work-up is listed below in the assessment section, but there was no evidence of B12, folate or iron deficiency and reticulocyte count was inappropriately normal.  HIV and SARS coronavirus 2 tests were negative.  Creatinine was 1.49 with GFR was 54.  Glucose was 221.  Other labs of interest obtained 03/23/2019 included a B12 level of 944, folate 13.2, ferritin 1060, with serum iron 134, saturation 41%.  The absolute reticulocyte count was 64.5.  His end-stage renal disease is felt to be due to diabetes and hypertension.  He was on dialysis starting 2010 but on 03/31/2015 he underwent a cadaveric renal transplant at [facility].  He had transient CMV viremia, which was treated.  He continues on Bactrim prophylaxis and  tacrolimus.  He also receives mycophenolate and when he had the episode of severe anemia and a mycophenolate was decreased in half and his Bactrim was decreased to Monday Wednesday Friday.  His tacrolimus level was on the high side.  This has been since adjusted by Dr. Marval Regal and is being repeated today  The patient was transfused and discharged and referred here for evaluation of pancytopenia  INTERVAL HISTORY:  [name] is here today for follow up of his MDS.  He was started on Retacrit on 05/22/2019.  He receives this weekly.  He is tolerating this well.   He received one unit of blood 3 weeks ago, and again 1 week ago.  His hemoglobin is 7.1 today.    REVIEW OF SYSTEMS: Gail denies any new issues.  He remains fatigued, but is doing what he wants to do, and it isn't interfering with his normal activities.  He has no nausea, or vomiting, no pain, night sweats, fevers, chills, headaches, bowel/bladder changes, cough, shortness of breath, chest pain, or palpitations.  A detailed ROS was otherwise non contributory.    PAST MEDICAL HISTORY: Past Medical History:  Diagnosis Date  . Anemia    hx of at beginnning of dialysis- 05/2009  . Arthritis    right hip   . End stage renal disease ([place])    [place]   . End stage renal disease on dialysis ([place])   . Essential hypertension, malignant   . Hypertrophy of prostate without urinary obstruction and other lower urinary tract symptoms (LUTS)   . Myelodysplasia (myelodysplastic syndrome) ([place]) 05/20/2019  . Pure  hypercholesterolemia   . Renal insufficiency   . Type II or unspecified type diabetes mellitus without mention of complication, not stated as uncontrolled    TYPE 2      PAST SURGICAL HISTORY: Past Surgical History:  Procedure Laterality Date  . A/V FISTULAGRAM Left 06/12/2017   Procedure: A/V Fistulagram;  Surgeon: Algernon Huxley, MD;  Location: [facility];  Service: Cardiovascular;  Laterality:  Left;  . CARDIAC CATHETERIZATION  04/29/10   normal coronaries, LVEF 45%, mild global LV hypokinesis ([place])  . CATARACT EXTRACTION W/PHACO Right 04/03/2013   Procedure: CATARACT EXTRACTION PHACO AND INTRAOCULAR LENS PLACEMENT (IOC);  Surgeon: Adonis Brook, MD;  Location: [facility];  Service: Ophthalmology;  Laterality: Right;  . CERVICAL DISCECTOMY  03/1999  . COLONOSCOPY     Hx: of  . CYSTOSCOPY WITH BIOPSY  11/01/2012   Procedure: CYSTOSCOPY WITH BIOPSY;  Surgeon: Molli Hazard, MD;  Location: [facility];  Service: Urology;  Laterality: N/A;  with fulgeration  . CYSTOSCOPY WITH RETROGRADE PYELOGRAM, URETEROSCOPY AND STENT PLACEMENT  11/01/2012   Procedure: CYSTOSCOPY WITH RETROGRADE PYELOGRAM, URETEROSCOPY AND STENT PLACEMENT;  Surgeon: Molli Hazard, MD;  Location: [facility];  Service: Urology;  Laterality: Bilateral;  . DIALYSIS FISTULA CREATION  2010  . EYE SURGERY  2012   cataracts  . EYE SURGERY  11/2014  . HOLMIUM LASER APPLICATION  02/24/2584   Procedure: HOLMIUM LASER APPLICATION;  Surgeon: Molli Hazard, MD;  [facility];  Service: Urology;  Laterality: Bilateral;  . KIDNEY TRANSPLANT  2016  . MEMBRANE PEEL Right 09/04/2013   Procedure: MEMBRANE PEEL;  Surgeon: Adonis Brook, MD;  Location: [facility];  Service: Ophthalmology;  Laterality: Right;  . NECK SURGERY  1990  . PARS PLANA VITRECTOMY Right 09/04/2013   Procedure: PARS PLANA VITRECTOMY WITH 23 GAUGE;  Surgeon: Adonis Brook, MD;  Location: [facility];  Service: Ophthalmology;  Laterality: Right;  . PARS PLANA VITRECTOMY Left 02/17/2015  . PARS PLANA VITRECTOMY Left 02/17/2015   Procedure: PARS PLANA VITRECTOMY WITH 25G REMOVAL/SUTURE INTRAOCULAR LENS;  Surgeon: Hayden Pedro, MD;  Location: [facility];  Service: Ophthalmology;  Laterality: Left;  . PERIPHERAL VASCULAR CATHETERIZATION Left 08/08/2016   Procedure: A/V Shuntogram/Fistulagram;  Surgeon: Algernon Huxley, MD;  Location: [facility];  Service:  Cardiovascular;  Laterality: Left;  . PERIPHERAL VASCULAR CATHETERIZATION N/A 08/08/2016   Procedure: A/V Shunt Intervention;  Surgeon: Algernon Huxley, MD;  Location: [facility];  Service: Cardiovascular;  Laterality: N/A;  . PHOTOCOAGULATION WITH LASER Right 09/04/2013   Procedure: PHOTOCOAGULATION WITH LASER;  Surgeon: Adonis Brook, MD;  Location: [facility];  Service: Ophthalmology;  Laterality: Right;  ENDOLASER  . TRANSTHORACIC ECHOCARDIOGRAM  06/19/2012   normal LV sys function, EF 55-60%, mild to mod diastolic dysfunction, mild AI ([place])  tonsillectomy @ 73 years of age   FAMILY HISTORY: Family History  Problem Relation Age of Onset  . Breast cancer Mother 8       deceased  . Pneumonia Father 90       deceased  . Hypertension Brother   . Diabetes Mellitus II Brother   . Cancer Sister        Intestinal   The patient's s father died from pneumonia at age 23.  He was originally a Psychologist, sport and exercise and later worked in a Designer, television/film set.  Patients' mother died from breast cancer at age 73. The patient has 7 siblings. Patient denies anyone in her family having  breast, ovarian, prostate, or pancreatic cancer.  1 sister died from intestinal cancer.  Another sister, Katharine Look, died from an aneurism.     SOCIAL HISTORY: (Current as of 04/22/2019) Mr. Advani retired from the [employer] office. 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 He notes that he was exposed to agent orange. His wife, Duque, is a retired Corporate treasurer. They have two children, Lennette Bihari and Lauren. Lennette Bihari lives in [place] and works at the [employer] for [employer]. Lauren lives in [place] and teaches handicapped children for the [place] school system.Kennith Center has two grandchildren.  His daughter is expecting in August 2020. The patient he belongs to [employer] on [place]: In the absence of any documentation, Benino's spouse is automatically her healthcare power of attorney.      HEALTH MAINTENANCE: Social History   Tobacco Use  . Smoking status: Former Smoker    Types: Cigarettes    Quit date: 10/02/1990    Years since quitting: 28.7  . Smokeless tobacco: Never Used  Substance Use Topics  . Alcohol use: Yes    Comment: occasional  . Drug use: No    Colonoscopy: up to date/ [facility]  Allergies  Allergen Reactions  . Codeine Nausea And Vomiting and Nausea Only  . Doxycycline Nausea And Vomiting and Nausea Only  . Latex Hives and Rash  . Tape Other (See Comments)    The "plastic, waffle-printed" tape tears and bruises the skin    Current Outpatient Medications  Medication Sig Dispense Refill  . acetaminophen (TYLENOL) 325 MG tablet Take 650 mg by mouth every 6 (six) hours as needed (for pain or headaches).     [name] allopurinol (ZYLOPRIM) 300 MG tablet Take 300 mg by mouth as needed (as directed for gout flares).     [name] amLODipine (NORVASC) 5 MG tablet Take 1 tablet (5 mg total) by mouth daily. 30 tablet 0  . betamethasone dipropionate (DIPROLENE) 0.05 % ointment     . cloNIDine (CATAPRES) 0.1 MG tablet Take 0.1 mg by mouth 3 (three) times daily.    [name] donepezil (ARICEPT) 10 MG tablet Take 10 mg by mouth at bedtime.    . dorzolamide (TRUSOPT) 2 % ophthalmic solution Place 1 drop into both eyes 2 (two) times a day.     . doxazosin (CARDURA) 4 MG tablet Take 4 mg by mouth 2 (two) times daily.    . furosemide (LASIX) 40 MG tablet Take 40-80 mg by mouth See admin instructions. Take 80 mg by mouth in the morning and 40 mg in the evening    . HUMALOG KWIKPEN 100 UNIT/ML KiwkPen Inject 8-16 Units into the skin See admin instructions. Inject 8-16 units into the skin three times a day before meals, PER SLIDING SCALE (BGL 150 or less, use nothing)  3  . Insulin Glargine (LANTUS SOLOSTAR) 100 UNIT/ML Solostar Pen Inject 34 Units into the skin daily before breakfast.     . K Phos Mono-Sod Phos Di & Mono (PHOSPHA 250 NEUTRAL) [phone] MG TABS Take 1-2 tablets by  mouth See admin instructions. Take 2 tablets by mouth in the morning and 1 tablet at bedtime at 8 PM    . ketoconazole (NIZORAL) 2 % shampoo Apply 1 application topically every 7 (seven) days.   2  . KLOR-CON M20 20 MEQ tablet Take 20 mEq by mouth daily.  1  . latanoprost (XALATAN) 0.005 % ophthalmic solution Place 1 drop into  both eyes at bedtime.     [name] linagliptin (TRADJENTA) 5 MG TABS tablet Take 5 mg by mouth daily with breakfast.     . losartan (COZAAR) 100 MG tablet Take 100 mg by mouth daily.    . metoprolol tartrate (LOPRESSOR) 25 MG tablet Take 25 mg by mouth 2 (two) times daily.    . Multiple Vitamins-Minerals (ONE-A-DAY MENS 50+ ADVANTAGE) TABS Take 1 tablet by mouth daily with breakfast.    . mupirocin cream (BACTROBAN) 2 % Apply 1 application topically 2 (two) times daily. 15 g 0  . mycophenolate (MYFORTIC) 180 MG EC tablet Take 1 tablet (180 mg total) by mouth 2 (two) times daily. 60 tablet 0  . NON FORMULARY Apply 1 application topically See admin instructions. Skin-So-Soft lotion: Apply as needed to dry sites of skin    . sodium chloride (OCEAN) 0.65 % SOLN nasal spray Place 2 sprays into both nostrils as needed for congestion.     . sulfamethoxazole-trimethoprim (BACTRIM) 400-80 MG tablet Take 1 tablet by mouth every Monday, Wednesday, and Friday.    . tacrolimus (PROGRAF) 0.5 MG capsule Take 0.5 mg by mouth 2 (two) times daily.    . tacrolimus (PROGRAF) 1 MG capsule Take 3 mg by mouth 2 (two) times daily.   11  . tadalafil (CIALIS) 20 MG tablet Take 20 mg by mouth daily as needed for erectile dysfunction.     [name] VIGAMOX 0.5 % ophthalmic solution INSTILL 1 DROP INTO RIGHT EYE 4 TIMES A DAY    . Vitamin D, Ergocalciferol, (DRISDOL) 50000 units CAPS capsule Take 50,000 Units by mouth every 7 (seven) days.      No current facility-administered medications for this visit.      OBJECTIVE:  Vitals:   06/19/19 0955  BP: (!) 142/59  Pulse: 66  Resp: 18  Temp: 98.5 F (36.9 C)   SpO2: 100%     Body mass index is 29.04 kg/m.   Wt Readings from Last 3 Encounters:  06/19/19 214 lb 1.6 oz (97.1 kg)  06/05/19 209 lb 14.4 oz (95.2 kg)  05/30/19 221 lb 11.2 oz (100.6 kg)      ECOG FS:2 - Symptomatic, <50% confined to bed   GENERAL: Patient is a well appearing older male in no acute distress HEENT:  Sclerae anicteric.  Oropharynx clear and moist. No ulcerations or evidence of oropharyngeal candidiasis. Neck is supple.  NODES:  No cervical, supraclavicular, or axillary lymphadenopathy palpated.  LUNGS:  Clear to auscultation bilaterally.  No wheezes or rhonchi. HEART:  Regular rate and rhythm. No murmur appreciated. ABDOMEN:  Soft, nontender.  Positive, normoactive bowel sounds. No organomegaly palpated. MSK:  No focal spinal tenderness to palpation. EXTREMITIES:  No peripheral edema.   SKIN:  Scalp rash resolved.  No other rashes or lesions.  No nail dyscrasia NEURO:  Nonfocal. Well oriented.  Appropriate affect.   LAB RESULTS: On 04/04/2019 per Dr. Elissa Hefty note the patient white cell count was 4.5, with neutrophils 2.4, hemoglobin was 6.6, MCV 101, and platelets 41,000.  CMP     Component Value Date/Time   NA 141 06/19/2019 0935   NA 135 (L) 07/04/2013 1256   K 3.8 06/19/2019 0935   K 4.4 07/11/2013 1108   CL 108 06/19/2019 0935   CL 96 (L) 07/04/2013 1256   CO2 24 06/19/2019 0935   CO2 32 07/04/2013 1256   GLUCOSE 153 (H) 06/19/2019 0935   GLUCOSE 189 (H) 07/04/2013 1256   BUN 16 06/19/2019  0935   BUN 26 (H) 07/04/2013 1256   CREATININE 1.27 (H) 06/19/2019 0935   CREATININE 1.35 (H) 04/26/2019 1455   CREATININE 10.86 (H) 07/04/2013 1256   CALCIUM 10.0 06/19/2019 0935   CALCIUM 8.8 07/04/2013 1256   PROT 6.9 06/19/2019 0935   ALBUMIN 3.8 06/19/2019 0935   AST 16 06/19/2019 0935   AST 23 04/26/2019 1455   ALT 13 06/19/2019 0935   ALT 19 04/26/2019 1455   ALKPHOS 91 06/19/2019 0935   BILITOT 0.8 06/19/2019 0935   BILITOT 0.9 04/26/2019 1455    GFRNONAA 57 (L) 06/19/2019 0935   GFRNONAA 53 (L) 04/26/2019 1455   GFRNONAA 4 (L) 07/04/2013 1256   GFRAA >60 06/19/2019 0935   GFRAA >60 04/26/2019 1455   GFRAA 5 (L) 07/04/2013 1256    No results found for: TOTALPROTELP, ALBUMINELP, A1GS, A2GS, BETS, BETA2SER, GAMS, MSPIKE, SPEI  No results found for: KPAFRELGTCHN, LAMBDASER, KAPLAMBRATIO  Lab Results  Component Value Date   WBC 8.7 06/19/2019   NEUTROABS 3.6 06/19/2019   HGB 7.1 (L) 06/19/2019   HCT 22.3 (L) 06/19/2019   MCV 96.5 06/19/2019   PLT 30 (L) 06/19/2019    '@LASTCHEMISTRY'$ @  No results found for: LABCA2  No components found for: [id number]  No results for input(s): INR in the last 168 hours.  No results found for: LABCA2  No results found for: QMG867  No results found for: YPP509  No results found for: TOI712  No results found for: CA2729  No components found for: HGQUANT  No results found for: CEA1 / No results found for: CEA1   No results found for: AFPTUMOR  No results found for: CHROMOGRNA  No results found for: PSA1  Infusion on 06/19/2019  Component Date Value Ref Range Status  . Order Confirmation 06/19/2019    Final                   Value:ORDER PROCESSED BY BLOOD BANK Performed at [facility], [place] [address], [place], [place] [address]   . ABO/RH(D) 06/19/2019 A POS   Final  . Antibody Screen 06/19/2019 NEG   Final  . Sample Expiration 06/19/2019 06/22/2019,2359   Final  . Unit Number 06/19/2019 [id number]   Final  . Blood Component Type 06/19/2019 RBC, LR IRR   Final  . Unit division 06/19/2019 00   Final  . Status of Unit 06/19/2019 ISSUED   Final  . Transfusion Status 06/19/2019 OK TO TRANSFUSE   Final  . Crossmatch Result 06/19/2019    Final                   Value:Compatible Performed at [facility], [place] [address], [place], [place] [address]   . ISSUE DATE / TIME 06/19/2019 [id number]   Final  . Blood Product Unit Number  06/19/2019 [id number]   Final  . PRODUCT CODE 06/19/2019 [id number]   Final  . Unit Type and Rh 06/19/2019 6200   Final  . Blood Product Expiration Date 06/19/2019 [id number]   Final  Appointment on 06/19/2019  Component Date Value Ref Range Status  . WBC 06/19/2019 8.7  4.0 - 10.5 K/uL Final  . RBC 06/19/2019 2.31* 4.22 - 5.81 MIL/uL Final  . Hemoglobin 06/19/2019 7.1* 13.0 - 17.0 g/dL Final  . HCT 06/19/2019 22.3* 39.0 - 52.0 % Final  . MCV 06/19/2019 96.5  80.0 - 100.0 fL Final  . MCH 06/19/2019 30.7  26.0 - 34.0 pg Final  .  MCHC 06/19/2019 31.8  30.0 - 36.0 g/dL Final  . RDW 06/19/2019 20.1* 11.5 - 15.5 % Final  . Platelets 06/19/2019 30* 150 - 400 K/uL Final   Comment: Immature Platelet Fraction may be clinically indicated, consider ordering this additional test [id number]   . nRBC 06/19/2019 1.0* 0.0 - 0.2 % Final  . Neutrophils Relative % 06/19/2019 40  % Final  . Neutro Abs 06/19/2019 3.6  1.7 - 7.7 K/uL Final  . Lymphocytes Relative 06/19/2019 19  % Final  . Lymphs Abs 06/19/2019 1.7  0.7 - 4.0 K/uL Final  . Monocytes Relative 06/19/2019 8  % Final  . Monocytes Absolute 06/19/2019 0.7  0.1 - 1.0 K/uL Final  . Eosinophils Relative 06/19/2019 0  % Final  . Eosinophils Absolute 06/19/2019 0.0  0.0 - 0.5 K/uL Final  . Basophils Relative 06/19/2019 1  % Final  . Basophils Absolute 06/19/2019 0.0  0.0 - 0.1 K/uL Final  . Smear Review 06/19/2019 META AND MYELO SEEN   Final  . Immature Granulocytes 06/19/2019 32  % Final  . Abs Immature Granulocytes 06/19/2019 2.80* 0.00 - 0.07 K/uL Final  . Ovalocytes 06/19/2019 PRESENT   Final   Performed at [facility], [place] [address], [place], [place] [address]  . Sodium 06/19/2019 141  135 - 145 mmol/L Final  . Potassium 06/19/2019 3.8  3.5 - 5.1 mmol/L Final  . Chloride 06/19/2019 108  98 - 111 mmol/L Final  . CO2 06/19/2019 24  22 - 32 mmol/L Final  . Glucose, Bld 06/19/2019 153* 70 - 99 mg/dL Final  . BUN  06/19/2019 16  8 - 23 mg/dL Final  . Creatinine, Ser 06/19/2019 1.27* 0.61 - 1.24 mg/dL Final  . Calcium 06/19/2019 10.0  8.9 - 10.3 mg/dL Final  . Total Protein 06/19/2019 6.9  6.5 - 8.1 g/dL Final  . Albumin 06/19/2019 3.8  3.5 - 5.0 g/dL Final  . AST 06/19/2019 16  15 - 41 U/L Final  . ALT 06/19/2019 13  0 - 44 U/L Final  . Alkaline Phosphatase 06/19/2019 91  38 - 126 U/L Final  . Total Bilirubin 06/19/2019 0.8  0.3 - 1.2 mg/dL Final  . GFR calc non Af Amer 06/19/2019 57* >60 mL/min Final  . GFR calc Af Amer 06/19/2019 >60  >60 mL/min Final  . Anion gap 06/19/2019 9  5 - 15 Final   Performed at [facility], [place] [address], [place], [place] [address]  . Blood Bank Specimen 06/19/2019 SAMPLE AVAILABLE FOR TESTING   Final  . Sample Expiration 06/19/2019    Final                   Value:06/22/2019,2359 Performed at [facility], [place] [address] [place], [place] [address]     (this displays the last labs from the last 3 days)  No results found for: TOTALPROTELP, ALBUMINELP, A1GS, A2GS, BETS, BETA2SER, GAMS, MSPIKE, SPEI (this displays SPEP labs)  No results found for: KPAFRELGTCHN, LAMBDASER, KAPLAMBRATIO (kappa/lambda light chains)  No results found for: HGBA, HGBA2QUANT, HGBFQUANT, HGBSQUAN (Hemoglobinopathy evaluation)   Lab Results  Component Value Date   LDH 291 (H) 06/12/2019    Lab Results  Component Value Date   IRON 134 03/23/2019   TIBC 326 03/23/2019   IRONPCTSAT 41 (H) 03/23/2019   (Iron and TIBC)  Lab Results  Component Value Date   FERRITIN 2,992 (H) 06/12/2019    Urinalysis  Component Value Date/Time   COLORURINE YELLOW 03/22/2019 1609   APPEARANCEUR CLEAR 03/22/2019 1609   LABSPEC 1.010 03/22/2019 1609   PHURINE 6.0 03/22/2019 1609   GLUCOSEU NEGATIVE 03/22/2019 1609   HGBUR NEGATIVE 03/22/2019 1609   BILIRUBINUR NEGATIVE 03/22/2019 [place] 03/22/2019 1609   PROTEINUR NEGATIVE  03/22/2019 1609   UROBILINOGEN 0.2 08/17/2012 1120   NITRITE NEGATIVE 03/22/2019 1609   LEUKOCYTESUR NEGATIVE 03/22/2019 1609     STUDIES:  Pathology and cytogenetics results discussed with the patient who also received copies today   ELIGIBLE FOR AVAILABLE RESEARCH PROTOCOL: no   ASSESSMENT: 73 y.o. [place] man with a history of end-stage renal disease secondary to hypertension and diabetes, on hemodialysis as of 2010  (a) status post renal transplant at [facility] 03/31/2015  (1) pancytopenia noted 03/22/2019 with fatigue and shortness of breath  (a) smear review confirmed pancytopenia and macrocytosis with hypogranular polys   (b) Bone Marrow Biopsy on 05/02/2019 shows hypercellular marrow with trilineal dysplasia, slightly left shifted myeloid maturation but no increase in blast, mild myelofibrosis  (c) Flow Cytometry shows no increase in CD34 positive blasts and left shifted myeloid maturation   (d) 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   (2) myelodysplasia, as per #1 above  (a) prognosis: low risk (average survival < 3 years  (3) aranesp to start 05/22/2019  (a) baseline epo level  (b) ferritin 1060 on 03/23/2019  PLAN:  Theo's hemoglobin is 7.1 today.  This is only slightly improved as compared to his labs last week, and his retacrit injection.  He is fatigued, but considering the holiday weekend approaching, I think it is safest for him to receive one unit of blood today in addition to his retacrit injection.  I placed orders for this and reviewed it with his nurse.  I reviewed Emmerson's situation with Dr. Jana Hakim, and his labs that show a decreased platelet count, and his essentially unchanged hemoglobin from last week.  Dr. 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  We will continue to see him weekly, and orders have been placed for Azacytidine to start if labs continue to downtrend.  This was reviewed after Tyus left the clinic, and Dr. Jana Hakim was going to try to see him in infusion.  I will touch base with Kennith Center tomorrow to see how that conversation went.  We will see him back in 1 week for labs, f/u, and a possible transfusion.  he was recommended to continue with the appropriate pandemic precautions. he knows to call for any questions that may arise between now and his next appointment.  We are happy to see him sooner if needed.   A total of (20) minutes of face-to-face time was spent with this patient with greater than 50% of that time in counseling and care-coordination.   Wilber Bihari, NP 06/19/19 4:08 PM Medical Oncology and Hematology [facility] [address] [place], [place] [address] Tel. [phone]    Fax. [phone]

## 2019-06-20 LAB — TYPE AND SCREEN
ABO/RH(D): A POS
Antibody Screen: NEGATIVE
Unit division: 0

## 2019-06-20 LAB — BPAM RBC
Blood Product Expiration Date: 202009152359
ISSUE DATE / TIME: 202009021238
Unit Type and Rh: 6200

## 2019-06-26 ENCOUNTER — Inpatient Hospital Stay: Payer: Medicare Other

## 2019-06-26 ENCOUNTER — Other Ambulatory Visit: Payer: Self-pay

## 2019-06-26 VITALS — BP 154/72 | HR 60 | Temp 98.5°F | Resp 18

## 2019-06-26 DIAGNOSIS — D649 Anemia, unspecified: Secondary | ICD-10-CM

## 2019-06-26 DIAGNOSIS — D696 Thrombocytopenia, unspecified: Secondary | ICD-10-CM

## 2019-06-26 DIAGNOSIS — Z5111 Encounter for antineoplastic chemotherapy: Secondary | ICD-10-CM | POA: Diagnosis not present

## 2019-06-26 DIAGNOSIS — D469 Myelodysplastic syndrome, unspecified: Secondary | ICD-10-CM

## 2019-06-26 DIAGNOSIS — D61818 Other pancytopenia: Secondary | ICD-10-CM

## 2019-06-26 DIAGNOSIS — Z940 Kidney transplant status: Secondary | ICD-10-CM

## 2019-06-26 LAB — FERRITIN: Ferritin: 2792 ng/mL — ABNORMAL HIGH (ref 24–336)

## 2019-06-26 LAB — LACTATE DEHYDROGENASE: LDH: 293 U/L — ABNORMAL HIGH (ref 98–192)

## 2019-06-26 LAB — COMPREHENSIVE METABOLIC PANEL
ALT: 13 U/L (ref 0–44)
AST: 16 U/L (ref 15–41)
Albumin: 4 g/dL (ref 3.5–5.0)
Alkaline Phosphatase: 89 U/L (ref 38–126)
Anion gap: 10 (ref 5–15)
BUN: 16 mg/dL (ref 8–23)
CO2: 23 mmol/L (ref 22–32)
Calcium: 9.9 mg/dL (ref 8.9–10.3)
Chloride: 108 mmol/L (ref 98–111)
Creatinine, Ser: 1.33 mg/dL — ABNORMAL HIGH (ref 0.61–1.24)
GFR calc Af Amer: >60 mL/min (ref >60)
GFR calc non Af Amer: 54 mL/min — ABNORMAL LOW (ref >60)
Glucose, Bld: 143 mg/dL — ABNORMAL HIGH (ref 70–99)
Potassium: 3.7 mmol/L (ref 3.5–5.1)
Sodium: 141 mmol/L (ref 135–145)
Total Bilirubin: 0.8 mg/dL (ref 0.3–1.2)
Total Protein: 6.9 g/dL (ref 6.5–8.1)

## 2019-06-26 LAB — CBC WITH DIFFERENTIAL/PLATELET
Abs Immature Granulocytes: 3.15 10*3/uL — ABNORMAL HIGH (ref 0.00–0.07)
Basophils Absolute: 0 10*3/uL (ref 0.0–0.1)
Basophils Relative: 0 %
Eosinophils Absolute: 0 10*3/uL (ref 0.0–0.5)
Eosinophils Relative: 0 %
HCT: 22.1 % — ABNORMAL LOW (ref 39.0–52.0)
Hemoglobin: 7.1 g/dL — ABNORMAL LOW (ref 13.0–17.0)
Immature Granulocytes: 34 %
Lymphocytes Relative: 16 %
Lymphs Abs: 1.5 10*3/uL (ref 0.7–4.0)
MCH: 30.7 pg (ref 26.0–34.0)
MCHC: 32.1 g/dL (ref 30.0–36.0)
MCV: 95.7 fL (ref 80.0–100.0)
Monocytes Absolute: 0.8 10*3/uL (ref 0.1–1.0)
Monocytes Relative: 9 %
Neutro Abs: 3.7 10*3/uL (ref 1.7–7.7)
Neutrophils Relative %: 41 %
Platelets: 32 10*3/uL — ABNORMAL LOW (ref 150–400)
RBC: 2.31 MIL/uL — ABNORMAL LOW (ref 4.22–5.81)
RDW: 19.7 % — ABNORMAL HIGH (ref 11.5–15.5)
WBC: 9.2 10*3/uL (ref 4.0–10.5)
nRBC: 1.1 % — ABNORMAL HIGH (ref 0.0–0.2)

## 2019-06-26 LAB — SAMPLE TO BLOOD BANK

## 2019-06-26 LAB — RETICULOCYTES
Immature Retic Fract: 18.5 % — ABNORMAL HIGH (ref 2.3–15.9)
RBC.: 2.32 MIL/uL — ABNORMAL LOW (ref 4.22–5.81)
Retic Count, Absolute: 34.6 10*3/uL (ref 19.0–186.0)
Retic Ct Pct: 1.5 % (ref 0.4–3.1)

## 2019-06-26 MED ORDER — EPOETIN ALFA-EPBX 40000 UNIT/ML IJ SOLN
40000.0000 [IU] | Freq: Once | INTRAMUSCULAR | Status: AC
  Administered 2019-06-26: 40000 [IU] via SUBCUTANEOUS
  Filled 2019-06-26: qty 1

## 2019-06-26 NOTE — Patient Instructions (Signed)

## 2019-06-27 ENCOUNTER — Encounter (HOSPITAL_COMMUNITY): Payer: Medicare Other

## 2019-07-03 ENCOUNTER — Other Ambulatory Visit: Payer: Self-pay | Admitting: Oncology

## 2019-07-03 ENCOUNTER — Inpatient Hospital Stay: Payer: Medicare Other

## 2019-07-03 ENCOUNTER — Inpatient Hospital Stay (HOSPITAL_BASED_OUTPATIENT_CLINIC_OR_DEPARTMENT_OTHER): Payer: Medicare Other | Admitting: Oncology

## 2019-07-03 ENCOUNTER — Other Ambulatory Visit: Payer: Self-pay

## 2019-07-03 VITALS — BP 148/72 | HR 62 | Temp 98.2°F | Resp 18

## 2019-07-03 DIAGNOSIS — Z5111 Encounter for antineoplastic chemotherapy: Secondary | ICD-10-CM | POA: Diagnosis not present

## 2019-07-03 DIAGNOSIS — D649 Anemia, unspecified: Secondary | ICD-10-CM

## 2019-07-03 DIAGNOSIS — D631 Anemia in chronic kidney disease: Secondary | ICD-10-CM

## 2019-07-03 DIAGNOSIS — E113513 Type 2 diabetes mellitus with proliferative diabetic retinopathy with macular edema, bilateral: Secondary | ICD-10-CM | POA: Diagnosis not present

## 2019-07-03 DIAGNOSIS — D469 Myelodysplastic syndrome, unspecified: Secondary | ICD-10-CM

## 2019-07-03 DIAGNOSIS — D696 Thrombocytopenia, unspecified: Secondary | ICD-10-CM

## 2019-07-03 DIAGNOSIS — D61818 Other pancytopenia: Secondary | ICD-10-CM

## 2019-07-03 DIAGNOSIS — Z940 Kidney transplant status: Secondary | ICD-10-CM

## 2019-07-03 DIAGNOSIS — N189 Chronic kidney disease, unspecified: Secondary | ICD-10-CM

## 2019-07-03 LAB — COMPREHENSIVE METABOLIC PANEL
ALT: 8 U/L (ref 0–44)
AST: 12 U/L — ABNORMAL LOW (ref 15–41)
Albumin: 3.6 g/dL (ref 3.5–5.0)
Alkaline Phosphatase: 94 U/L (ref 38–126)
Anion gap: 8 (ref 5–15)
BUN: 13 mg/dL (ref 8–23)
CO2: 26 mmol/L (ref 22–32)
Calcium: 10.2 mg/dL (ref 8.9–10.3)
Chloride: 110 mmol/L (ref 98–111)
Creatinine, Ser: 1.31 mg/dL — ABNORMAL HIGH (ref 0.61–1.24)
GFR calc Af Amer: >60 mL/min (ref >60)
GFR calc non Af Amer: 55 mL/min — ABNORMAL LOW (ref >60)
Glucose, Bld: 129 mg/dL — ABNORMAL HIGH (ref 70–99)
Potassium: 3.5 mmol/L (ref 3.5–5.1)
Sodium: 144 mmol/L (ref 135–145)
Total Bilirubin: 0.9 mg/dL (ref 0.3–1.2)
Total Protein: 6.6 g/dL (ref 6.5–8.1)

## 2019-07-03 LAB — CBC WITH DIFFERENTIAL/PLATELET
Abs Immature Granulocytes: 2.66 10*3/uL — ABNORMAL HIGH (ref 0.00–0.07)
Basophils Absolute: 0 10*3/uL (ref 0.0–0.1)
Basophils Relative: 0 %
Eosinophils Absolute: 0 10*3/uL (ref 0.0–0.5)
Eosinophils Relative: 0 %
HCT: 19.2 % — ABNORMAL LOW (ref 39.0–52.0)
Hemoglobin: 6.2 g/dL — CL (ref 13.0–17.0)
Immature Granulocytes: 28 %
Lymphocytes Relative: 16 %
Lymphs Abs: 1.6 10*3/uL (ref 0.7–4.0)
MCH: 31.2 pg (ref 26.0–34.0)
MCHC: 32.3 g/dL (ref 30.0–36.0)
MCV: 96.5 fL (ref 80.0–100.0)
Monocytes Absolute: 1.2 10*3/uL — ABNORMAL HIGH (ref 0.1–1.0)
Monocytes Relative: 13 %
Neutro Abs: 4.1 10*3/uL (ref 1.7–7.7)
Neutrophils Relative %: 43 %
Platelets: 35 10*3/uL — ABNORMAL LOW (ref 150–400)
RBC: 1.99 MIL/uL — ABNORMAL LOW (ref 4.22–5.81)
RDW: 20.5 % — ABNORMAL HIGH (ref 11.5–15.5)
WBC: 9.5 10*3/uL (ref 4.0–10.5)
nRBC: 1.3 % — ABNORMAL HIGH (ref 0.0–0.2)

## 2019-07-03 LAB — SAMPLE TO BLOOD BANK

## 2019-07-03 LAB — PREPARE RBC (CROSSMATCH)

## 2019-07-03 MED ORDER — SODIUM CHLORIDE 0.9% IV SOLUTION
250.0000 mL | Freq: Once | INTRAVENOUS | Status: DC
  Filled 2019-07-03: qty 250

## 2019-07-03 MED ORDER — ACETAMINOPHEN 325 MG PO TABS
ORAL_TABLET | ORAL | Status: AC
  Filled 2019-07-03: qty 2

## 2019-07-03 MED ORDER — ONDANSETRON HCL 8 MG PO TABS: 8.0000 mg | ORAL_TABLET | Freq: Two times a day (BID) | ORAL | 1 refills | Status: DC | PRN

## 2019-07-03 MED ORDER — DIPHENHYDRAMINE HCL 25 MG PO CAPS
ORAL_CAPSULE | ORAL | Status: AC
  Filled 2019-07-03: qty 1

## 2019-07-03 MED ORDER — EPOETIN ALFA-EPBX 40000 UNIT/ML IJ SOLN
40000.0000 [IU] | Freq: Once | INTRAMUSCULAR | Status: AC
  Administered 2019-07-03: 14:00:00 40000 [IU] via SUBCUTANEOUS
  Filled 2019-07-03: qty 1

## 2019-07-03 MED ORDER — DIPHENHYDRAMINE HCL 25 MG PO CAPS
25.0000 mg | ORAL_CAPSULE | Freq: Once | ORAL | Status: AC
  Administered 2019-07-03: 25 mg via ORAL

## 2019-07-03 MED ORDER — ACETAMINOPHEN 325 MG PO TABS
650.0000 mg | ORAL_TABLET | Freq: Once | ORAL | Status: AC
  Administered 2019-07-03: 650 mg via ORAL

## 2019-07-03 NOTE — Patient Instructions (Signed)

## 2019-07-03 NOTE — Patient Instructions (Signed)
Blood Transfusion, Adult, Care After This sheet gives you information about how to care for yourself after your procedure. Your doctor may also give you more specific instructions. If you have problems or questions, contact your doctor. Follow these instructions at home:   Take over-the-counter and prescription medicines only as told by your doctor.  Go back to your normal activities as told by your doctor.  Follow instructions from your doctor about how to take care of the area where an IV tube was put into your vein (insertion site). Make sure you: ? Wash your hands with soap and water before you change your bandage (dressing). If there is no soap and water, use hand sanitizer. ? Change your bandage as told by your doctor.  Check your IV insertion site every day for signs of infection. Check for: ? More redness, swelling, or pain. ? More fluid or blood. ? Warmth. ? Pus or a bad smell. Contact a doctor if:  You have more redness, swelling, or pain around the IV insertion site.  You have more fluid or blood coming from the IV insertion site.  Your IV insertion site feels warm to the touch.  You have pus or a bad smell coming from the IV insertion site.  Your pee (urine) turns pink, red, or brown.  You feel weak after doing your normal activities. Get help right away if:  You have signs of a serious allergic or body defense (immune) system reaction, including: ? Itchiness. ? Hives. ? Trouble breathing. ? Anxiety. ? Pain in your chest or lower back. ? Fever, flushing, and chills. ? Fast pulse. ? Rash. ? Watery poop (diarrhea). ? Throwing up (vomiting). ? Dark pee. ? Serious headache. ? Dizziness. ? Stiff neck. ? Yellow color in your face or the white parts of your eyes (jaundice). Summary  After a blood transfusion, return to your normal activities as told by your doctor.  Every day, check for signs of infection where the IV tube was put into your vein.  Some  signs of infection are warm skin, more redness and pain, more fluid or blood, and pus or a bad smell where the needle went in.  Contact your doctor if you feel weak or have any unusual symptoms. This information is not intended to replace advice given to you by your health care provider. Make sure you discuss any questions you have with your health care provider. Document Released: 10/24/2014 Document Revised: 02/07/2018 Document Reviewed: 05/27/2016 Elsevier Patient Education  2020 [employer]  Coronavirus (COVID-19) Are you at risk?  Are you at risk for the Coronavirus (COVID-19)?  To be considered HIGH RISK for Coronavirus (COVID-19), you have to meet the following criteria:  . Traveled to [place], [place], [place], [place] or [place]; or in the [place] to [place], [place], [place], or [place]; and have fever, cough, and shortness of breath within the last 2 weeks of travel OR . Been in close contact with a person diagnosed with COVID-19 within the last 2 weeks and have fever, cough, and shortness of breath . IF YOU DO NOT MEET THESE CRITERIA, YOU ARE CONSIDERED LOW RISK FOR COVID-19.  What to do if you are HIGH RISK for COVID-19?  . If you are having a medical emergency, call 911. . Seek medical care right away. Before you go to a doctor's office, urgent care or emergency department, call ahead and tell them about your recent travel, contact with someone diagnosed with COVID-19, and   your symptoms. You should receive instructions from your physician's office regarding next steps of care.  . When you arrive at healthcare provider, tell the healthcare staff immediately you have returned from visiting [place], [place], [place], [place] or [place]; or traveled in the [place] to [place], [place], [place], or [place]; in the last two weeks or you have been in close contact with a person diagnosed with COVID-19 in the last 2 weeks.   . Tell the health care staff about  your symptoms: fever, cough and shortness of breath. . After you have been seen by a medical provider, you will be either: o Tested for (COVID-19) and discharged home on quarantine except to seek medical care if symptoms worsen, and asked to  - Stay home and avoid contact with others until you get your results (4-5 days)  - Avoid travel on public transportation if possible (such as bus, train, or airplane) or o Sent to the Emergency Department by EMS for evaluation, COVID-19 testing, and possible admission depending on your condition and test results.  What to do if you are LOW RISK for COVID-19?  Reduce your risk of any infection by using the same precautions used for avoiding the common cold or flu:  . Wash your hands often with soap and warm water for at least 20 seconds.  If soap and water are not readily available, use an alcohol-based hand sanitizer with at least 60% alcohol.  . If coughing or sneezing, cover your mouth and nose by coughing or sneezing into the elbow areas of your shirt or coat, into a tissue or into your sleeve (not your hands). . Avoid shaking hands with others and consider head nods or verbal greetings only. . Avoid touching your eyes, nose, or mouth with unwashed hands.  . Avoid close contact with people who are sick. . Avoid places or events with large numbers of people in one location, like concerts or sporting events. . Carefully consider travel plans you have or are making. . If you are planning any travel outside or inside the [place], visit the CDC's Travelers' Health webpage for the latest health notices. . If you have some symptoms but not all symptoms, continue to monitor at home and seek medical attention if your symptoms worsen. . If you are having a medical emergency, call 911.   ADDITIONAL HEALTHCARE OPTIONS FOR PATIENTS  [facility] / e-Visit: [email or web address]         [facility]:  [phone]  [facility]: [phone]                   [facility]: [phone]   

## 2019-07-03 NOTE — Progress Notes (Signed)
[place]  [phone] [phone]) [phone]    ID: [name] DOB: 1946/01/27  MR#: [id number]  DTO#:[id number]  Patient Care Team: Rogers Blocker, MD as PCP - General (Internal Medicine) Owens Hara, Virgie Dad, MD as Consulting Physician (Oncology) Donato Heinz, MD as Consulting Physician (Nephrology) Ander Slade, Carlisle Beers, MD as Referring Physician (Ophthalmology) [name] Erskine Squibb, MD as Referring Physician (Vascular Surgery) OTHER MD:   CHIEF COMPLAINT: MDS  CURRENT TREATMENT: Retacrit, azacytidine   HISTORY OF CURRENT ILLNESS: From the original intake note:  [name] is a 73 year old [place] man with a history of type 2 diabetes complicated by significant retinopathy and end-stage renal disease, status post renal transplant, followed by Dr. Marval Regal.  [name] notes that he was feeling more fatigued than usual, and that last time he felt this way, his Vitamin D levels were low. He called his doctors office to ask for his blood work to be ran before he was sent to the ED by Dr. Marlou Sa. The patient presented to the emergency room 03/22/2019 with symptomatic anemia, was found to have hemoglobin of 5.9 and pancytopenia.  Additional work-up is listed below in the assessment section, but there was no evidence of B12, folate or iron deficiency and reticulocyte count was inappropriately normal.  HIV and SARS coronavirus 2 tests were negative.  Creatinine was 1.49 with GFR was 54.  Glucose was 221.  Other labs of interest obtained 03/23/2019 included a B12 level of 944, folate 13.2, ferritin 1060, with serum iron 134, saturation 41%.  The absolute reticulocyte count was 64.5.  His end-stage renal disease is felt to be due to diabetes and hypertension.  He was on dialysis starting 2010 but on 03/31/2015 he underwent a cadaveric renal transplant at [facility].  He had transient CMV viremia, which was treated.  He continues on Bactrim  prophylaxis and tacrolimus.  He also receives mycophenolate and when he had the episode of severe anemia and a mycophenolate was decreased in half and his Bactrim was decreased to Monday Wednesday Friday.  His tacrolimus level was on the high side.  This has been since adjusted by Dr. Marval Regal and is being repeated today  The patient was transfused and discharged and referred here for evaluation of pancytopenia  INTERVAL HISTORY:  [name] is here today for follow up of his MDS.  He was started on Retacrit on 05/22/2019.  He receives this weekly, with good tolerance.  He thinks he may be having some response because it took a little longer for him to be transfused this time, namely 3 weeks.  Nevertheless today he felt weak, had a headache, and knew that he was going to be anemic which indeed he is   REVIEW OF SYSTEMS: [name] has had no intercurrent infections, rash, nausea, vomiting visual changes, cough, phlegm production pleurisy, or change in bowel or bladder habits.  There have been no intercurrent falls.  A detailed review of systems was otherwise stable  PAST MEDICAL HISTORY: Past Medical History:  Diagnosis Date   Anemia    hx of at beginnning of dialysis- 05/2009   Arthritis    right hip    End stage renal disease ([place])    [place]    End stage renal disease on dialysis [facility]    Essential hypertension, malignant    Hypertrophy of prostate without urinary obstruction and other lower urinary tract symptoms (LUTS)    Myelodysplasia (myelodysplastic syndrome) ([place]) 05/20/2019  Pure hypercholesterolemia    Renal insufficiency    Type II or unspecified type diabetes mellitus without mention of complication, not stated as uncontrolled    TYPE 2      PAST SURGICAL HISTORY: Past Surgical History:  Procedure Laterality Date   A/V FISTULAGRAM Left 06/12/2017   Procedure: A/V Fistulagram;  Surgeon: Algernon Huxley, MD;  Location: [place];  Service: Cardiovascular;  Laterality: Left;   CARDIAC CATHETERIZATION  04/29/10   normal coronaries, LVEF 45%, mild global LV hypokinesis ([place])   CATARACT EXTRACTION W/PHACO Right 04/03/2013   Procedure: CATARACT EXTRACTION PHACO AND INTRAOCULAR LENS PLACEMENT ([place]);  Surgeon: Adonis Brook, MD;  Location: [place];  Service: Ophthalmology;  Laterality: Right;   CERVICAL DISCECTOMY  03/1999   COLONOSCOPY     Hx: of   CYSTOSCOPY WITH BIOPSY  11/01/2012   Procedure: CYSTOSCOPY WITH BIOPSY;  Surgeon: Molli Hazard, MD;  Location: [facility];  Service: Urology;  Laterality: N/A;  with fulgeration   CYSTOSCOPY WITH RETROGRADE PYELOGRAM, URETEROSCOPY AND STENT PLACEMENT  11/01/2012   Procedure: CYSTOSCOPY WITH RETROGRADE PYELOGRAM, URETEROSCOPY AND STENT PLACEMENT;  Surgeon: Molli Hazard, MD;  [facility];  Service: Urology;  Laterality: Bilateral;   DIALYSIS FISTULA CREATION  2010   EYE SURGERY  2012   cataracts   EYE SURGERY  11/2014   HOLMIUM LASER APPLICATION  1/49/7026   Procedure: HOLMIUM LASER APPLICATION;  Surgeon: Molli Hazard, MD;  [facility];  Service: Urology;  Laterality: Bilateral;   KIDNEY TRANSPLANT  2016   MEMBRANE PEEL Right 09/04/2013   Procedure: MEMBRANE PEEL;  Surgeon: Adonis Brook, MD;  Location: [place];  Service: Ophthalmology;  Laterality: Right;   NECK SURGERY  1990   PARS PLANA VITRECTOMY Right 09/04/2013   Procedure: PARS PLANA VITRECTOMY WITH 23 GAUGE;  Surgeon: Adonis Brook, MD;  Location: [place];  Service: Ophthalmology;  Laterality: Right;   PARS PLANA VITRECTOMY Left 02/17/2015   PARS PLANA VITRECTOMY Left 02/17/2015   Procedure: PARS PLANA VITRECTOMY WITH 25G REMOVAL/SUTURE INTRAOCULAR LENS;  Surgeon: Hayden Pedro, MD;  Location: [place];  Service: Ophthalmology;  Laterality: Left;   PERIPHERAL VASCULAR CATHETERIZATION Left 08/08/2016   Procedure: A/V Shuntogram/Fistulagram;  Surgeon: Algernon Huxley, MD;   Location: [place] [facility];  Service: Cardiovascular;  Laterality: Left;   PERIPHERAL VASCULAR CATHETERIZATION N/A 08/08/2016   Procedure: A/V Shunt Intervention;  Surgeon: Algernon Huxley, MD;  Location: [place] [facility];  Service: Cardiovascular;  Laterality: N/A;   PHOTOCOAGULATION WITH LASER Right 09/04/2013   Procedure: PHOTOCOAGULATION WITH LASER;  Surgeon: Adonis Brook, MD;  Location: [place];  Service: Ophthalmology;  Laterality: Right;  ENDOLASER   TRANSTHORACIC ECHOCARDIOGRAM  06/19/2012   normal LV sys function, EF 55-60%, mild to mod diastolic dysfunction, mild AI ([place])  tonsillectomy @ 73 years of age   FAMILY HISTORY: Family History  Problem Relation Age of Onset   Breast cancer Mother 19       deceased   Pneumonia Father 27       deceased   Hypertension Brother    Diabetes Mellitus II Brother    Cancer Sister        Intestinal   The patient's s father died from pneumonia at age 64.  He was originally a Psychologist, sport and exercise and later worked in a Designer, television/film set.  Patients' mother died from breast cancer at age 56. The patient has 7 siblings. Patient denies anyone in her family  having breast, ovarian, prostate, or pancreatic cancer.  1 sister died from intestinal cancer.  Another sister, Katharine Look, died from an aneurism.     SOCIAL HISTORY: (Current as of 04/22/2019) Mr. Lambertson retired from the [employer] office. 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 He notes that he was exposed to agent orange. His wife, Po, is a retired Corporate treasurer. They have two children, Lennette Savage and Lauren. Lennette Savage lives in [place] and works at the [employer] for [employer]. Lauren lives in [place] and teaches handicapped children for the [place] school system.Kennith Center has two grandchildren.  His daughter is expecting in August 2020. The patient he belongs to [employer] on [place]: In the absence of any documentation, M[place]i's spouse is  automatically her healthcare power of attorney.     HEALTH MAINTENANCE: Social History   Tobacco Use   Smoking status: Former Smoker    Types: Cigarettes    Quit date: 10/02/1990    Years since quitting: 28.7   Smokeless tobacco: Never Used  Substance Use Topics   Alcohol use: Yes    Comment: occasional   Drug use: No    Colonoscopy: up to date/ [facility]  Allergies  Allergen Reactions   Codeine Nausea And Vomiting and Nausea Only   Doxycycline Nausea And Vomiting and Nausea Only   Latex Hives and Rash   Tape Other (See Comments)    The "plastic, waffle-printed" tape tears and bruises the skin    Current Outpatient Medications  Medication Sig Dispense Refill   acetaminophen (TYLENOL) 325 MG tablet Take 650 mg by mouth every 6 (six) hours as needed (for pain or headaches).      allopurinol (ZYLOPRIM) 300 MG tablet Take 300 mg by mouth as needed (as directed for gout flares).      amLODipine (NORVASC) 5 MG tablet Take 1 tablet (5 mg total) by mouth daily. 30 tablet 0   betamethasone dipropionate (DIPROLENE) 0.05 % ointment      cloNIDine (CATAPRES) 0.1 MG tablet Take 0.1 mg by mouth 3 (three) times daily.     donepezil (ARICEPT) 10 MG tablet Take 10 mg by mouth at bedtime.     dorzolamide (TRUSOPT) 2 % ophthalmic solution Place 1 drop into both eyes 2 (two) times a day.      doxazosin (CARDURA) 4 MG tablet Take 4 mg by mouth 2 (two) times daily.     furosemide (LASIX) 40 MG tablet Take 40-80 mg by mouth See admin instructions. Take 80 mg by mouth in the morning and 40 mg in the evening     HUMALOG KWIKPEN 100 UNIT/ML KiwkPen Inject 8-16 Units into the skin See admin instructions. Inject 8-16 units into the skin three times a day before meals, PER SLIDING SCALE (BGL 150 or less, use nothing)  3   Insulin Glargine (LANTUS SOLOSTAR) 100 UNIT/ML Solostar Pen Inject 34 Units into the skin daily before breakfast.      K Phos Mono-Sod Phos Di & Mono (PHOSPHA 250  NEUTRAL) [id number] MG TABS Take 1-2 tablets by mouth See admin instructions. Take 2 tablets by mouth in the morning and 1 tablet at bedtime at 8 PM     ketoconazole (NIZORAL) 2 % shampoo Apply 1 application topically every 7 (seven) days.   2   KLOR-CON M20 20 MEQ tablet Take 20 mEq by mouth daily.  1   latanoprost (XALATAN) 0.005 % ophthalmic solution Place 1 drop  into both eyes at bedtime.      linagliptin (TRADJENTA) 5 MG TABS tablet Take 5 mg by mouth daily with breakfast.      losartan (COZAAR) 100 MG tablet Take 100 mg by mouth daily.     metoprolol tartrate (LOPRESSOR) 25 MG tablet Take 25 mg by mouth 2 (two) times daily.     Multiple Vitamins-Minerals (ONE-A-DAY MENS 50+ ADVANTAGE) TABS Take 1 tablet by mouth daily with breakfast.     mupirocin cream (BACTROBAN) 2 % Apply 1 application topically 2 (two) times daily. 15 g 0   mycophenolate (MYFORTIC) 180 MG EC tablet Take 1 tablet (180 mg total) by mouth 2 (two) times daily. 60 tablet 0   NON FORMULARY Apply 1 application topically See admin instructions. Skin-So-Soft lotion: Apply as needed to dry sites of skin     ondansetron (ZOFRAN) 8 MG tablet Take 1 tablet (8 mg total) by mouth 2 (two) times daily as needed (Nausea or vomiting). 30 tablet 1   sodium chloride (OCEAN) 0.65 % SOLN nasal spray Place 2 sprays into both nostrils as needed for congestion.      sulfamethoxazole-trimethoprim (BACTRIM) 400-80 MG tablet Take 1 tablet by mouth every Monday, Wednesday, and Friday.     tacrolimus (PROGRAF) 0.5 MG capsule Take 0.5 mg by mouth 2 (two) times daily.     tacrolimus (PROGRAF) 1 MG capsule Take 3 mg by mouth 2 (two) times daily.   11   tadalafil (CIALIS) 20 MG tablet Take 20 mg by mouth daily as needed for erectile dysfunction.      VIGAMOX 0.5 % ophthalmic solution INSTILL 1 DROP INTO RIGHT EYE 4 TIMES A DAY     Vitamin D, Ergocalciferol, (DRISDOL) 50000 units CAPS capsule Take 50,000 Units by mouth every 7 (seven)  days.      No current facility-administered medications for this visit.      OBJECTIVE: Older African-American man examined in the infusion area  There were no vitals filed for this visit.   There is no height or weight on file to calculate BMI.   Wt Readings from Last 3 Encounters:  06/19/19 214 lb 1.6 oz (97.1 kg)  06/05/19 209 lb 14.4 oz (95.2 kg)  05/30/19 221 lb 11.2 oz (100.6 kg)   For labs today (07/03/2019) see the infusion area flowsheet   ECOG FS:2 - Symptomatic, <50% confined to bed    LAB RESULTS:   CMP     Component Value Date/Time   NA 144 07/03/2019 1324   NA 135 (L) 07/04/2013 1256   K 3.5 07/03/2019 1324   K 4.4 07/11/2013 1108   CL 110 07/03/2019 1324   CL 96 (L) 07/04/2013 1256   CO2 26 07/03/2019 1324   CO2 32 07/04/2013 1256   GLUCOSE 129 (H) 07/03/2019 1324   GLUCOSE 189 (H) 07/04/2013 1256   BUN 13 07/03/2019 1324   BUN 26 (H) 07/04/2013 1256   CREATININE 1.31 (H) 07/03/2019 1324   CREATININE 1.35 (H) 04/26/2019 1455   CREATININE 10.86 (H) 07/04/2013 1256   CALCIUM 10.2 07/03/2019 1324   CALCIUM 8.8 07/04/2013 1256   PROT 6.6 07/03/2019 1324   ALBUMIN 3.6 07/03/2019 1324   AST 12 (L) 07/03/2019 1324   AST 23 04/26/2019 1455   ALT 8 07/03/2019 1324   ALT 19 04/26/2019 1455   ALKPHOS 94 07/03/2019 1324   BILITOT 0.9 07/03/2019 1324   BILITOT 0.9 04/26/2019 1455   GFRNONAA 55 (L) 07/03/2019 1324  GFRNONAA 53 (L) 04/26/2019 1455   GFRNONAA 4 (L) 07/04/2013 1256   GFRAA >60 07/03/2019 1324   GFRAA >60 04/26/2019 1455   GFRAA 5 (L) 07/04/2013 1256    No results found for: TOTALPROTELP, ALBUMINELP, A1GS, A2GS, BETS, BETA2SER, GAMS, MSPIKE, SPEI  No results found for: KPAFRELGTCHN, LAMBDASER, KAPLAMBRATIO  Lab Results  Component Value Date   WBC 9.5 07/03/2019   NEUTROABS 4.1 07/03/2019   HGB 6.2 (LL) 07/03/2019   HCT 19.2 (L) 07/03/2019   MCV 96.5 07/03/2019   PLT 35 (L) 07/03/2019    _0 @  No results found for:  LABCA2  No components found for: PRFFMB846  No results for input(s): INR in the last 168 hours.  No results found for: LABCA2  No results found for: CAN199  No results found for: KZL935  No results found for: TSV779  No results found for: CA2729  No components found for: HGQUANT  No results found for: CEA1 / No results found for: CEA1   No results found for: AFPTUMOR  No results found for: CHROMOGRNA  No results found for: PSA1  Infusion on 07/03/2019  Component Date Value Ref Range Status   Order Confirmation 07/03/2019    Final                   Value:ORDER PROCESSED BY BLOOD BANK Performed at [facility], [place] [address] [place], [place] [address]    ABO/RH(D) 07/03/2019 A POS   Final   Antibody Screen 07/03/2019 NEG   Final   Sample Expiration 07/03/2019    Final                   Value:07/06/2019,2359 Performed at [facility], [place] [address], [place], [place] [address]   Appointment on 07/03/2019  Component Date Value Ref Range Status   Blood Bank Specimen 07/03/2019 SAMPLE AVAILABLE FOR TESTING   Final   Sample Expiration 07/03/2019    Final                   Value:07/06/2019,2359 Performed at [facility], [place] [address], [place], [place] [address]    WBC 07/03/2019 9.5  4.0 - 10.5 K/uL Final   RBC 07/03/2019 1.99* 4.22 - 5.81 MIL/uL Final   Hemoglobin 07/03/2019 6.2* 13.0 - 17.0 g/dL Final   This critical result has verified and been called to RN NICOLE AMMONS by Julian Hy on 09 16 2020 at 1339, and has been read back.    HCT 07/03/2019 19.2* 39.0 - 52.0 % Final   MCV 07/03/2019 96.5  80.0 - 100.0 fL Final   MCH 07/03/2019 31.2  26.0 - 34.0 pg Final   MCHC 07/03/2019 32.3  30.0 - 36.0 g/dL Final   RDW 07/03/2019 20.5* 11.5 - 15.5 % Final   Platelets 07/03/2019 35* 150 - 400 K/uL Final   Comment: Immature Platelet Fraction may be clinically indicated, consider ordering  this additional test [id number]    nRBC 07/03/2019 1.3* 0.0 - 0.2 % Final   Neutrophils Relative % 07/03/2019 43  % Final   Neutro Abs 07/03/2019 4.1  1.7 - 7.7 K/uL Final   Lymphocytes Relative 07/03/2019 16  % Final   Lymphs Abs 07/03/2019 1.6  0.7 - 4.0 K/uL Final   Monocytes Relative 07/03/2019 13  % Final   Monocytes Absolute 07/03/2019 1.2* 0.1 - 1.0 K/uL Final   Eosinophils Relative 07/03/2019 0  % Final   Eosinophils Absolute  07/03/2019 0.0  0.0 - 0.5 K/uL Final   Basophils Relative 07/03/2019 0  % Final   Basophils Absolute 07/03/2019 0.0  0.0 - 0.1 K/uL Final   Immature Granulocytes 07/03/2019 28  % Final   metas and myelocytes present   Abs Immature Granulocytes 07/03/2019 2.66* 0.00 - 0.07 K/uL Final   Ovalocytes 07/03/2019 PRESENT   Final   Performed at [facility], [place] [address] [place], [place] [address]   Sodium 07/03/2019 144  135 - 145 mmol/L Final   Potassium 07/03/2019 3.5  3.5 - 5.1 mmol/L Final   Chloride 07/03/2019 110  98 - 111 mmol/L Final   CO2 07/03/2019 26  22 - 32 mmol/L Final   Glucose, Bld 07/03/2019 129* 70 - 99 mg/dL Final   BUN 07/03/2019 13  8 - 23 mg/dL Final   Creatinine, Ser 07/03/2019 1.31* 0.61 - 1.24 mg/dL Final   Calcium 07/03/2019 10.2  8.9 - 10.3 mg/dL Final   Total Protein 07/03/2019 6.6  6.5 - 8.1 g/dL Final   Albumin 07/03/2019 3.6  3.5 - 5.0 g/dL Final   AST 07/03/2019 12* 15 - 41 U/L Final   ALT 07/03/2019 8  0 - 44 U/L Final   Alkaline Phosphatase 07/03/2019 94  38 - 126 U/L Final   Total Bilirubin 07/03/2019 0.9  0.3 - 1.2 mg/dL Final   GFR calc non Af Amer 07/03/2019 55* >60 mL/min Final   GFR calc Af Amer 07/03/2019 >60  >60 mL/min Final   Anion gap 07/03/2019 8  5 - 15 Final   Performed at [facility], [place] [address], [place], [place] [address]    (this displays the last labs from the last 3 days)  No results found for: TOTALPROTELP,  ALBUMINELP, A1GS, A2GS, BETS, BETA2SER, GAMS, MSPIKE, SPEI (this displays SPEP labs)  No results found for: KPAFRELGTCHN, LAMBDASER, KAPLAMBRATIO (kappa/lambda light chains)  No results found for: HGBA, HGBA2QUANT, HGBFQUANT, HGBSQUAN (Hemoglobinopathy evaluation)   Lab Results  Component Value Date   LDH 293 (H) 06/26/2019    Lab Results  Component Value Date   IRON 134 03/23/2019   TIBC 326 03/23/2019   IRONPCTSAT 41 (H) 03/23/2019   (Iron and TIBC)  Lab Results  Component Value Date   FERRITIN 2,792 (H) 06/26/2019    Urinalysis    Component Value Date/Time   COLORURINE YELLOW 03/22/2019 1609   APPEARANCEUR CLEAR 03/22/2019 1609   LABSPEC 1.010 03/22/2019 1609   PHURINE 6.0 03/22/2019 1609   GLUCOSEU NEGATIVE 03/22/2019 1609   HGBUR NEGATIVE 03/22/2019 1609   BILIRUBINUR NEGATIVE 03/22/2019 [place] 03/22/2019 1609   PROTEINUR NEGATIVE 03/22/2019 1609   UROBILINOGEN 0.2 08/17/2012 1120   NITRITE NEGATIVE 03/22/2019 [place] 03/22/2019 1609     STUDIES:  Pathology and cytogenetics results discussed with the patient who also received copies today   ELIGIBLE FOR AVAILABLE RESEARCH PROTOCOL: no   ASSESSMENT: 73 y.o. [place] man with a history of end-stage renal disease secondary to hypertension and diabetes, on hemodialysis as of 2010  (a) status post renal transplant at [facility] 03/31/2015  (1) pancytopenia noted 03/22/2019 with fatigue and shortness of breath  (a) smear review confirmed pancytopenia and macrocytosis with hypogranular polys   (b) Bone Marrow Biopsy on 05/02/2019 shows hypercellular marrow with trilineal dysplasia, slightly left shifted myeloid maturation but no increase in blast, mild myelofibrosis  (c) Flow Cytometry shows no increase in CD34 positive  blasts and left shifted myeloid maturation   (d) 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   (2) myelodysplasia, as per #1 above  (a) prognosis: low risk (average survival < 3 years  (3) aranesp started 05/22/2019  (a) baseline epo level >500, which predicts a poor response to EPO  (b) ferritin 1060 on 03/23/2019  PLAN:  Jencarlos is again severely anemic today.  He has been on EPO since early August, and only now may be having a slight response (he was able to go 3 weeks before transfusion which he does need today however).  I told him we need to add a azacitidine.  We went over the possible toxicities side effects and complications of this agent which we will try to get started 07/08/2019.  He will receive 5 days in a row every 4 weeks.  We will continue to follow his reticulocyte count ferritin and of course his hemoglobin as well as other routine labs.  I am hopeful that after a few months he will be transfusion independent.  All his blood transfusions are Leukopor.  I have not requested for them to be irradiated since he is not a transplant candidate  I prescribed ondansetron for him to take as needed but I anticipate no significant nausea from this treatment  I asked him to call us with any other issues that may develop before his next visit.    David Bihari, NP 07/03/19 2:47 PM Medical Oncology and Hematology [facility] [address] [place], [place] [address] Tel. [phone]    Fax. [phone]

## 2019-07-04 ENCOUNTER — Other Ambulatory Visit: Payer: Self-pay

## 2019-07-04 ENCOUNTER — Telehealth: Payer: Self-pay | Admitting: Oncology

## 2019-07-04 ENCOUNTER — Inpatient Hospital Stay: Payer: Medicare Other

## 2019-07-04 VITALS — BP 181/78 | HR 51 | Temp 98.4°F | Resp 16

## 2019-07-04 DIAGNOSIS — Z23 Encounter for immunization: Secondary | ICD-10-CM

## 2019-07-04 DIAGNOSIS — D649 Anemia, unspecified: Secondary | ICD-10-CM

## 2019-07-04 DIAGNOSIS — Z5111 Encounter for antineoplastic chemotherapy: Secondary | ICD-10-CM | POA: Diagnosis not present

## 2019-07-04 LAB — PREPARE RBC (CROSSMATCH)

## 2019-07-04 MED ORDER — ACETAMINOPHEN 325 MG PO TABS
ORAL_TABLET | ORAL | Status: AC
  Filled 2019-07-04: qty 2

## 2019-07-04 MED ORDER — DIPHENHYDRAMINE HCL 25 MG PO CAPS
25.0000 mg | ORAL_CAPSULE | Freq: Once | ORAL | Status: AC
  Administered 2019-07-04: 08:00:00 25 mg via ORAL

## 2019-07-04 MED ORDER — DIPHENHYDRAMINE HCL 25 MG PO CAPS
ORAL_CAPSULE | ORAL | Status: AC
  Filled 2019-07-04: qty 1

## 2019-07-04 MED ORDER — INFLUENZA VAC SPLIT QUAD 0.5 ML IM SUSY
PREFILLED_SYRINGE | INTRAMUSCULAR | Status: AC
  Filled 2019-07-04: qty 0.5

## 2019-07-04 MED ORDER — ACETAMINOPHEN 325 MG PO TABS
650.0000 mg | ORAL_TABLET | Freq: Once | ORAL | Status: AC
  Administered 2019-07-04: 08:00:00 650 mg via ORAL

## 2019-07-04 MED ORDER — INFLUENZA VAC SPLIT QUAD 0.5 ML IM SUSY
0.5000 mL | PREFILLED_SYRINGE | Freq: Once | INTRAMUSCULAR | Status: AC
  Administered 2019-07-04: 10:00:00 0.5 mL via INTRAMUSCULAR

## 2019-07-04 MED ORDER — SODIUM CHLORIDE 0.9% IV SOLUTION
250.0000 mL | Freq: Once | INTRAVENOUS | Status: AC
  Administered 2019-07-04: 08:00:00 250 mL via INTRAVENOUS
  Filled 2019-07-04: qty 250

## 2019-07-04 NOTE — Patient Instructions (Signed)

## 2019-07-04 NOTE — Telephone Encounter (Signed)
I left a message regarding schedule  

## 2019-07-05 LAB — TYPE AND SCREEN
ABO/RH(D): A POS
Antibody Screen: NEGATIVE
Unit division: 0
Unit division: 0

## 2019-07-05 LAB — BPAM RBC
Blood Product Expiration Date: 202010112359
Blood Product Expiration Date: 202010142359
ISSUE DATE / TIME: 202009161521
ISSUE DATE / TIME: 202009170751
Unit Type and Rh: 6200
Unit Type and Rh: 6200

## 2019-07-08 ENCOUNTER — Inpatient Hospital Stay: Payer: Medicare Other

## 2019-07-08 ENCOUNTER — Ambulatory Visit: Payer: Medicare Other

## 2019-07-08 ENCOUNTER — Other Ambulatory Visit: Payer: Self-pay

## 2019-07-08 VITALS — BP 155/63 | HR 60 | Temp 98.9°F | Resp 16

## 2019-07-08 DIAGNOSIS — D649 Anemia, unspecified: Secondary | ICD-10-CM

## 2019-07-08 DIAGNOSIS — Z5111 Encounter for antineoplastic chemotherapy: Secondary | ICD-10-CM | POA: Diagnosis not present

## 2019-07-08 DIAGNOSIS — D469 Myelodysplastic syndrome, unspecified: Secondary | ICD-10-CM

## 2019-07-08 DIAGNOSIS — D696 Thrombocytopenia, unspecified: Secondary | ICD-10-CM

## 2019-07-08 DIAGNOSIS — D61818 Other pancytopenia: Secondary | ICD-10-CM

## 2019-07-08 LAB — FERRITIN: Ferritin: 2685 ng/mL — ABNORMAL HIGH (ref 24–336)

## 2019-07-08 LAB — RETICULOCYTES
Immature Retic Fract: 24.6 % — ABNORMAL HIGH (ref 2.3–15.9)
RBC.: 2.52 MIL/uL — ABNORMAL LOW (ref 4.22–5.81)
Retic Count, Absolute: 52.9 10*3/uL (ref 19.0–186.0)
Retic Ct Pct: 2.1 % (ref 0.4–3.1)

## 2019-07-08 LAB — COMPREHENSIVE METABOLIC PANEL
ALT: 9 U/L (ref 0–44)
AST: 13 U/L — ABNORMAL LOW (ref 15–41)
Albumin: 3.9 g/dL (ref 3.5–5.0)
Alkaline Phosphatase: 108 U/L (ref 38–126)
Anion gap: 9 (ref 5–15)
BUN: 11 mg/dL (ref 8–23)
CO2: 25 mmol/L (ref 22–32)
Calcium: 10 mg/dL (ref 8.9–10.3)
Chloride: 108 mmol/L (ref 98–111)
Creatinine, Ser: 1.25 mg/dL — ABNORMAL HIGH (ref 0.61–1.24)
GFR calc Af Amer: >60 mL/min (ref >60)
GFR calc non Af Amer: 58 mL/min — ABNORMAL LOW (ref >60)
Glucose, Bld: 98 mg/dL (ref 70–99)
Potassium: 3.5 mmol/L (ref 3.5–5.1)
Sodium: 142 mmol/L (ref 135–145)
Total Bilirubin: 1 mg/dL (ref 0.3–1.2)
Total Protein: 7 g/dL (ref 6.5–8.1)

## 2019-07-08 LAB — CBC WITH DIFFERENTIAL/PLATELET
Abs Immature Granulocytes: 1.75 10*3/uL — ABNORMAL HIGH (ref 0.00–0.07)
Basophils Absolute: 0 10*3/uL (ref 0.0–0.1)
Basophils Relative: 0 %
Eosinophils Absolute: 0 10*3/uL (ref 0.0–0.5)
Eosinophils Relative: 0 %
HCT: 23.7 % — ABNORMAL LOW (ref 39.0–52.0)
Hemoglobin: 7.8 g/dL — ABNORMAL LOW (ref 13.0–17.0)
Immature Granulocytes: 25 %
Lymphocytes Relative: 20 %
Lymphs Abs: 1.4 10*3/uL (ref 0.7–4.0)
MCH: 31 pg (ref 26.0–34.0)
MCHC: 32.9 g/dL (ref 30.0–36.0)
MCV: 94 fL (ref 80.0–100.0)
Monocytes Absolute: 1 10*3/uL (ref 0.1–1.0)
Monocytes Relative: 14 %
Neutro Abs: 2.9 10*3/uL (ref 1.7–7.7)
Neutrophils Relative %: 41 %
Platelets: 37 10*3/uL — ABNORMAL LOW (ref 150–400)
RBC: 2.52 MIL/uL — ABNORMAL LOW (ref 4.22–5.81)
RDW: 18.3 % — ABNORMAL HIGH (ref 11.5–15.5)
WBC: 7 10*3/uL (ref 4.0–10.5)
nRBC: 2.3 % — ABNORMAL HIGH (ref 0.0–0.2)

## 2019-07-08 LAB — SAMPLE TO BLOOD BANK

## 2019-07-08 LAB — LACTATE DEHYDROGENASE: LDH: 327 U/L — ABNORMAL HIGH (ref 98–192)

## 2019-07-08 MED ORDER — AZACITIDINE CHEMO SQ INJECTION
75.0000 mg/m2 | Freq: Once | INTRAMUSCULAR | Status: AC
  Administered 2019-07-08: 165 mg via SUBCUTANEOUS
  Filled 2019-07-08: qty 6.6

## 2019-07-08 MED ORDER — ONDANSETRON HCL 8 MG PO TABS
8.0000 mg | ORAL_TABLET | Freq: Once | ORAL | Status: AC
  Administered 2019-07-08: 8 mg via ORAL

## 2019-07-08 MED ORDER — ONDANSETRON HCL 8 MG PO TABS
ORAL_TABLET | ORAL | Status: AC
  Filled 2019-07-08: qty 1

## 2019-07-08 NOTE — Patient Instructions (Addendum)
[place] Discharge Instructions for Patients Receiving Chemotherapy  Today you received the following chemotherapy agents :  Azacitidine.  To help prevent nausea and vomiting after your treatment, we encourage you to take your nausea medication as prescribed.   TAKE  ONDANSETRON  8 MG BY MOUTH  TWICE DAILY AS NEEDED FOR NAUSEA/VOMITING.  If you develop nausea and vomiting that is not controlled by your nausea medication, call the clinic.   BELOW ARE SYMPTOMS THAT SHOULD BE REPORTED IMMEDIATELY:  *FEVER GREATER THAN 100.5 F  *CHILLS WITH OR WITHOUT FEVER  NAUSEA AND VOMITING THAT IS NOT CONTROLLED WITH YOUR NAUSEA MEDICATION  *UNUSUAL SHORTNESS OF BREATH  *UNUSUAL BRUISING OR BLEEDING  TENDERNESS IN MOUTH AND THROAT WITH OR WITHOUT PRESENCE OF ULCERS  *URINARY PROBLEMS  *BOWEL PROBLEMS  UNUSUAL RASH Items with * indicate a potential emergency and should be followed up as soon as possible.  Feel free to call the clinic should you have any questions or concerns. The clinic phone number is [phone].  Please show the [place] at check-in to the Emergency Department and triage nurse.

## 2019-07-08 NOTE — Progress Notes (Signed)
Dr. Jana Hakim notified of all lab results today.  Proceed with chemo as per MD.

## 2019-07-09 ENCOUNTER — Other Ambulatory Visit: Payer: Self-pay

## 2019-07-09 ENCOUNTER — Inpatient Hospital Stay: Payer: Medicare Other

## 2019-07-09 ENCOUNTER — Ambulatory Visit: Payer: Medicare Other

## 2019-07-09 VITALS — BP 149/67 | HR 60 | Temp 98.7°F | Resp 18

## 2019-07-09 DIAGNOSIS — Z5111 Encounter for antineoplastic chemotherapy: Secondary | ICD-10-CM | POA: Diagnosis not present

## 2019-07-09 DIAGNOSIS — D696 Thrombocytopenia, unspecified: Secondary | ICD-10-CM

## 2019-07-09 DIAGNOSIS — D469 Myelodysplastic syndrome, unspecified: Secondary | ICD-10-CM

## 2019-07-09 MED ORDER — ONDANSETRON HCL 8 MG PO TABS
ORAL_TABLET | ORAL | Status: AC
  Filled 2019-07-09: qty 1

## 2019-07-09 MED ORDER — ONDANSETRON HCL 8 MG PO TABS
8.0000 mg | ORAL_TABLET | Freq: Once | ORAL | Status: AC
  Administered 2019-07-09: 15:00:00 8 mg via ORAL

## 2019-07-09 MED ORDER — AZACITIDINE CHEMO SQ INJECTION
75.0000 mg/m2 | Freq: Once | INTRAMUSCULAR | Status: AC
  Administered 2019-07-09: 16:00:00 165 mg via SUBCUTANEOUS
  Filled 2019-07-09: qty 6.6

## 2019-07-09 NOTE — Patient Instructions (Signed)
[facility] Discharge Instructions for Patients Receiving Chemotherapy  Today you received the following chemotherapy agents Azacitidine (VIDAZA).  To help prevent nausea and vomiting after your treatment, we encourage you to take your nausea medication as prescribed.   If you develop nausea and vomiting that is not controlled by your nausea medication, call the clinic.   BELOW ARE SYMPTOMS THAT SHOULD BE REPORTED IMMEDIATELY:  *FEVER GREATER THAN 100.5 F  *CHILLS WITH OR WITHOUT FEVER  NAUSEA AND VOMITING THAT IS NOT CONTROLLED WITH YOUR NAUSEA MEDICATION  *UNUSUAL SHORTNESS OF BREATH  *UNUSUAL BRUISING OR BLEEDING  TENDERNESS IN MOUTH AND THROAT WITH OR WITHOUT PRESENCE OF ULCERS  *URINARY PROBLEMS  *BOWEL PROBLEMS  UNUSUAL RASH Items with * indicate a potential emergency and should be followed up as soon as possible.  Feel free to call the clinic should you have any questions or concerns. The clinic phone number is [phone].  Please show the CHEMO ALERT CARD at check-in to the Emergency Department and triage nurse.  Coronavirus (COVID-19) Are you at risk?  Are you at risk for the Coronavirus (COVID-19)?  To be considered HIGH RISK for Coronavirus (COVID-19), you have to meet the following criteria:  . Traveled to [place], [place], [place], [place] or [place]; or in the [place] to [place], [place], [place], or [place]; and have fever, cough, and shortness of breath within the last 2 weeks of travel OR . Been in close contact with a person diagnosed with COVID-19 within the last 2 weeks and have fever, cough, and shortness of breath . IF YOU DO NOT MEET THESE CRITERIA, YOU ARE CONSIDERED LOW RISK FOR COVID-19.  What to do if you are HIGH RISK for COVID-19?  . If you are having a medical emergency, call 911. . Seek medical care right away. Before you go to a doctor's office, urgent care or emergency department, call ahead and tell them  about your recent travel, contact with someone diagnosed with COVID-19, and your symptoms. You should receive instructions from your physician's office regarding next steps of care.  . When you arrive at healthcare provider, tell the healthcare staff immediately you have returned from visiting [place], [place], [place], [place] or [place]; or traveled in the [place] to [place], [place], [place], or [place]; in the last two weeks or you have been in close contact with a person diagnosed with COVID-19 in the last 2 weeks.   . Tell the health care staff about your symptoms: fever, cough and shortness of breath. . After you have been seen by a medical provider, you will be either: o Tested for (COVID-19) and discharged home on quarantine except to seek medical care if symptoms worsen, and asked to  - Stay home and avoid contact with others until you get your results (4-5 days)  - Avoid travel on public transportation if possible (such as bus, train, or airplane) or o Sent to the Emergency Department by EMS for evaluation, COVID-19 testing, and possible admission depending on your condition and test results.  What to do if you are LOW RISK for COVID-19?  Reduce your risk of any infection by using the same precautions used for avoiding the common cold or flu:  . Wash your hands often with soap and warm water for at least 20 seconds.  If soap and water are not readily available, use an alcohol-based hand sanitizer with at least 60% alcohol.  . If coughing or   sneezing, cover your mouth and nose by coughing or sneezing into the elbow areas of your shirt or coat, into a tissue or into your sleeve (not your hands). . Avoid shaking hands with others and consider head nods or verbal greetings only. . Avoid touching your eyes, nose, or mouth with unwashed hands.  . Avoid close contact with people who are sick. . Avoid places or events with large numbers of people in one location, like concerts or  sporting events. . Carefully consider travel plans you have or are making. . If you are planning any travel outside or inside the [place], visit the CDC's Travelers' Health webpage for the latest health notices. . If you have some symptoms but not all symptoms, continue to monitor at home and seek medical attention if your symptoms worsen. . If you are having a medical emergency, call 911.   ADDITIONAL HEALTHCARE OPTIONS FOR PATIENTS  [facility] / e-Visit: [email or web address]         [facility]: [phone]  [facility]: [phone]                   [facility]: [phone]   

## 2019-07-10 ENCOUNTER — Inpatient Hospital Stay: Payer: Medicare Other

## 2019-07-10 ENCOUNTER — Other Ambulatory Visit: Payer: Self-pay

## 2019-07-10 VITALS — BP 166/64 | HR 65 | Temp 98.7°F | Resp 16

## 2019-07-10 DIAGNOSIS — D696 Thrombocytopenia, unspecified: Secondary | ICD-10-CM

## 2019-07-10 DIAGNOSIS — Z5111 Encounter for antineoplastic chemotherapy: Secondary | ICD-10-CM | POA: Diagnosis not present

## 2019-07-10 DIAGNOSIS — D469 Myelodysplastic syndrome, unspecified: Secondary | ICD-10-CM

## 2019-07-10 DIAGNOSIS — D649 Anemia, unspecified: Secondary | ICD-10-CM

## 2019-07-10 DIAGNOSIS — Z940 Kidney transplant status: Secondary | ICD-10-CM

## 2019-07-10 MED ORDER — AZACITIDINE CHEMO SQ INJECTION
75.0000 mg/m2 | Freq: Once | INTRAMUSCULAR | Status: AC
  Administered 2019-07-10: 165 mg via SUBCUTANEOUS
  Filled 2019-07-10: qty 6.6

## 2019-07-10 MED ORDER — ONDANSETRON HCL 8 MG PO TABS
ORAL_TABLET | ORAL | Status: AC
  Filled 2019-07-10: qty 1

## 2019-07-10 MED ORDER — ONDANSETRON HCL 8 MG PO TABS
8.0000 mg | ORAL_TABLET | Freq: Once | ORAL | Status: AC
  Administered 2019-07-10: 8 mg via ORAL

## 2019-07-10 MED ORDER — EPOETIN ALFA-EPBX 40000 UNIT/ML IJ SOLN
40000.0000 [IU] | Freq: Once | INTRAMUSCULAR | Status: AC
  Administered 2019-07-10: 40000 [IU] via SUBCUTANEOUS
  Filled 2019-07-10: qty 1

## 2019-07-10 NOTE — Patient Instructions (Addendum)
[facility] Discharge Instructions for Patients Receiving Chemotherapy  Today you received the following chemotherapy agents: Vidaza.  To help prevent nausea and vomiting after your treatment, we encourage you to take your nausea medication as directed.   If you develop nausea and vomiting that is not controlled by your nausea medication, call the clinic.   BELOW ARE SYMPTOMS THAT SHOULD BE REPORTED IMMEDIATELY:  *FEVER GREATER THAN 100.5 F  *CHILLS WITH OR WITHOUT FEVER  NAUSEA AND VOMITING THAT IS NOT CONTROLLED WITH YOUR NAUSEA MEDICATION  *UNUSUAL SHORTNESS OF BREATH  *UNUSUAL BRUISING OR BLEEDING  TENDERNESS IN MOUTH AND THROAT WITH OR WITHOUT PRESENCE OF ULCERS  *URINARY PROBLEMS  *BOWEL PROBLEMS  UNUSUAL RASH Items with * indicate a potential emergency and should be followed up as soon as possible.  Feel free to call the clinic should you have any questions or concerns. The clinic phone number is [phone].  Please show the [place] at check-in to the Emergency Department and triage nurse.  Epoetin Alfa injection What is this medicine? EPOETIN ALFA (e POE e tin AL fa) helps your body make more red blood cells. This medicine is used to treat anemia caused by chronic kidney disease, cancer chemotherapy, or HIV-therapy. It may also be used before surgery if you have anemia. This medicine may be used for other purposes; ask your health care provider or pharmacist if you have questions. COMMON BRAND NAME(S): Epogen, Procrit, Retacrit What should I tell my health care provider before I take this medicine? They need to know if you have any of these conditions:  cancer  heart disease  high blood pressure  history of blood clots  history of stroke  low levels of folate, iron, or vitamin B12 in the blood  seizures  an unusual or allergic reaction to erythropoietin, albumin, benzyl alcohol, hamster proteins, other medicines, foods, dyes,  or preservatives  pregnant or trying to get pregnant  breast-feeding How should I use this medicine? This medicine is for injection into a vein or under the skin. It is usually given by a health care professional in a hospital or clinic setting. If you get this medicine at home, you will be taught how to prepare and give this medicine. Use exactly as directed. Take your medicine at regular intervals. Do not take your medicine more often than directed. It is important that you put your used needles and syringes in a special sharps container. Do not put them in a trash can. If you do not have a sharps container, call your pharmacist or healthcare provider to get one. A special MedGuide will be given to you by the pharmacist with each prescription and refill. Be sure to read this information carefully each time. Talk to your pediatrician regarding the use of this medicine in children. While this drug may be prescribed for selected conditions, precautions do apply. Overdosage: If you think you have taken too much of this medicine contact a poison control center or emergency room at once. NOTE: This medicine is only for you. Do not share this medicine with others. What if I miss a dose? If you miss a dose, take it as soon as you can. If it is almost time for your next dose, take only that dose. Do not take double or extra doses. What may interact with this medicine? Interactions have not been studied. This list may not describe all possible interactions. Give your health care provider a list of all the  medicines, herbs, non-prescription drugs, or dietary supplements you use. Also tell them if you smoke, drink alcohol, or use illegal drugs. Some items may interact with your medicine. What should I watch for while using this medicine? Your condition will be monitored carefully while you are receiving this medicine. You may need blood work done while you are taking this medicine. This medicine may cause  a decrease in vitamin B6. You should make sure that you get enough vitamin B6 while you are taking this medicine. Discuss the foods you eat and the vitamins you take with your health care professional. What side effects may I notice from receiving this medicine? Side effects that you should report to your doctor or health care professional as soon as possible:  allergic reactions like skin rash, itching or hives, swelling of the face, lips, or tongue  seizures  signs and symptoms of a blood clot such as breathing problems; changes in vision; chest pain; severe, sudden headache; pain, swelling, warmth in the leg; trouble speaking; sudden numbness or weakness of the face, arm or leg  signs and symptoms of a stroke like changes in vision; confusion; trouble speaking or understanding; severe headaches; sudden numbness or weakness of the face, arm or leg; trouble walking; dizziness; loss of balance or coordination Side effects that usually do not require medical attention (report to your doctor or health care professional if they continue or are bothersome):  chills  cough  dizziness  fever  headaches  joint pain  muscle cramps  muscle pain  nausea, vomiting  pain, redness, or irritation at site where injected This list may not describe all possible side effects. Call your doctor for medical advice about side effects. You may report side effects to FDA at 1-800-FDA-1088. Where should I keep my medicine? Keep out of the reach of children. Store in a refrigerator between 2 and 8 degrees C (36 and 46 degrees F). Do not freeze or shake. Throw away any unused portion if using a single-dose vial. Multi-dose vials can be kept in the refrigerator for up to 21 days after the initial dose. Throw away unused medicine. NOTE: This sheet is a summary. It may not cover all possible information. If you have questions about this medicine, talk to your doctor, pharmacist, or health care provider.   2020 [employer]/Gold Standard (2017-05-12 08:35:19)

## 2019-07-11 ENCOUNTER — Ambulatory Visit: Payer: Medicare Other

## 2019-07-11 ENCOUNTER — Other Ambulatory Visit: Payer: Self-pay

## 2019-07-11 ENCOUNTER — Inpatient Hospital Stay (HOSPITAL_BASED_OUTPATIENT_CLINIC_OR_DEPARTMENT_OTHER): Payer: Medicare Other | Admitting: Oncology

## 2019-07-11 ENCOUNTER — Inpatient Hospital Stay: Payer: Medicare Other

## 2019-07-11 VITALS — BP 148/88 | HR 65 | Temp 98.7°F | Resp 18 | Ht 72.0 in | Wt 205.9 lb

## 2019-07-11 DIAGNOSIS — E113513 Type 2 diabetes mellitus with proliferative diabetic retinopathy with macular edema, bilateral: Secondary | ICD-10-CM

## 2019-07-11 DIAGNOSIS — D469 Myelodysplastic syndrome, unspecified: Secondary | ICD-10-CM | POA: Diagnosis not present

## 2019-07-11 DIAGNOSIS — Z5111 Encounter for antineoplastic chemotherapy: Secondary | ICD-10-CM | POA: Diagnosis not present

## 2019-07-11 DIAGNOSIS — D649 Anemia, unspecified: Secondary | ICD-10-CM | POA: Diagnosis not present

## 2019-07-11 DIAGNOSIS — D696 Thrombocytopenia, unspecified: Secondary | ICD-10-CM | POA: Diagnosis not present

## 2019-07-11 DIAGNOSIS — I158 Other secondary hypertension: Secondary | ICD-10-CM

## 2019-07-11 DIAGNOSIS — D61818 Other pancytopenia: Secondary | ICD-10-CM

## 2019-07-11 DIAGNOSIS — Z940 Kidney transplant status: Secondary | ICD-10-CM

## 2019-07-11 NOTE — Progress Notes (Signed)
[place]  [phone] [phone]    ID: [name] DOB: 1946-01-04  MR#: [id number]  YTK#:[id number]  Patient Care Team: David Blocker, MD as PCP - General (Internal Medicine) [name], David Dad, MD as Consulting Physician (Oncology) David Heinz, MD as Consulting Physician (Nephrology) [name], David Beers, MD as Referring Physician (Ophthalmology) David Cowboy Erskine Squibb, MD as Referring Physician (Vascular Surgery) OTHER MD:  CHIEF COMPLAINT: MDS  CURRENT TREATMENT: Retacrit, azacytidine   INTERVAL HISTORY: [name] is here today for follow up of his MDS.    He was started on Retacrit on 05/22/2019.  He tolerates this well.  So far there has not been a significant impact on his reticulocyte count. Results for [name], [name] (MRN [id number]) as of 07/11/2019 17:20  Ref. Range 05/22/2019 13:18 06/05/2019 10:26 06/12/2019 14:03 06/26/2019 13:18 07/08/2019 13:58  Retic Count, Absolute Latest Ref Range: 19.0 - 186.0 K/uL 72.3 63.8 57.0 34.6 52.9   He was started on azacitidine on 07/08/2019, today is day 4 cycle 1.  He is tolerating this well, although he says he did have a loose bowel movement this morning which was unusual for him.  He has had no other issues so far  REVIEW OF SYSTEMS: [name] mostly stays around the house and watches movies, which he says pretty much have to be comities.  He sees his children and their spouses but otherwise he and his wife pretty much state by themselves at home.  He has had no unusual headaches visual changes cough phlegm production pleurisy or change in bladder habits, and no rash no bleeding and no fever.  Detailed review of systems today was otherwise stable   HISTORY OF CURRENT ILLNESS: From the original intake note:  [name] is a 73 year old [place] man with a history of type 2 diabetes complicated by significant retinopathy and end-stage renal disease, status post renal transplant, followed by  [name].  [name] notes that he was feeling more fatigued than usual, and that last time he felt this way, his Vitamin D levels were low. He called his doctors office to ask for his blood work to be ran before he was sent to the ED by Dr. Marlou Savage. The patient presented to the emergency room 03/22/2019 with symptomatic anemia, was found to have hemoglobin of 5.9 and pancytopenia.  Additional work-up is listed below in the assessment section, but there was no evidence of B12, folate or iron deficiency and reticulocyte count was inappropriately normal.  HIV and SARS coronavirus 2 tests were negative.  Creatinine was 1.49 with GFR was 54.  Glucose was 221.  Other labs of interest obtained 03/23/2019 included a B12 level of 944, folate 13.2, ferritin 1060, with serum iron 134, saturation 41%.  The absolute reticulocyte count was 64.5.  His end-stage renal disease is felt to be due to diabetes and hypertension.  He was on dialysis starting 2010 but on 03/31/2015 he underwent a cadaveric renal transplant at [facility].  He had transient CMV viremia, which was treated.  He continues on Bactrim prophylaxis and tacrolimus.  He also receives mycophenolate and when he had the episode of severe anemia and a mycophenolate was decreased in half and his Bactrim was decreased to Monday Wednesday Friday.  His tacrolimus level was on the high side.  This has been since adjusted by [name] and is being repeated today  The patient was transfused and discharged and referred here for evaluation of pancytopenia   PAST  MEDICAL HISTORY: Past Medical History:  Diagnosis Date  . Anemia    hx of at beginnning of dialysis- 05/2009  . Arthritis    right hip   . End stage renal disease ([place])    [place]   . End stage renal disease on dialysis ([place])   . Essential hypertension, malignant   . Hypertrophy of prostate without urinary obstruction and other lower urinary tract  symptoms (LUTS)   . Myelodysplasia (myelodysplastic syndrome) ([place]) 05/20/2019  . Pure hypercholesterolemia   . Renal insufficiency   . Type II or unspecified type diabetes mellitus without mention of complication, not stated as uncontrolled    TYPE 2      PAST SURGICAL HISTORY: Past Surgical History:  Procedure Laterality Date  . A/V FISTULAGRAM Left 06/12/2017   Procedure: A/V Fistulagram;  Surgeon: David Huxley, MD;  Location: [place] [facility];  Service: Cardiovascular;  Laterality: Left;  . CARDIAC CATHETERIZATION  04/29/10   normal coronaries, LVEF 45%, mild global LV hypokinesis ([name])  . CATARACT EXTRACTION W/PHACO Right 04/03/2013   Procedure: CATARACT EXTRACTION PHACO AND INTRAOCULAR LENS PLACEMENT (IOC);  Surgeon: David Brook, MD;  Location: [place];  Service: Ophthalmology;  Laterality: Right;  . CERVICAL DISCECTOMY  03/1999  . COLONOSCOPY     Hx: of  . CYSTOSCOPY WITH BIOPSY  11/01/2012   Procedure: CYSTOSCOPY WITH BIOPSY;  Surgeon: Molli Hazard, MD;  Location[facility];  Service: Urology;  Laterality: N/A;  with fulgeration  . CYSTOSCOPY WITH RETROGRADE PYELOGRAM, URETEROSCOPY AND STENT PLACEMENT  11/01/2012   Procedure: CYSTOSCOPY WITH RETROGRADE PYELOGRAM, URETEROSCOPY AND STENT PLACEMENT;  Surgeon: Molli Hazard, MD;  [facility];  Service: Urology;  Laterality: Bilateral;  . DIALYSIS FISTULA CREATION  2010  . EYE SURGERY  2012   cataracts  . EYE SURGERY  11/2014  . HOLMIUM LASER APPLICATION  06/17/5175   Procedure: HOLMIUM LASER APPLICATION;  Surgeon: Molli Hazard, MD;  [facility];  Service: Urology;  Laterality: Bilateral;  . KIDNEY TRANSPLANT  2016  . MEMBRANE PEEL Right 09/04/2013   Procedure: MEMBRANE PEEL;  Surgeon: David Brook, MD;  Location: [place];  Service: Ophthalmology;  Laterality: Right;  . NECK SURGERY  1990  . PARS PLANA VITRECTOMY Right 09/04/2013   Procedure: PARS PLANA VITRECTOMY WITH 23 GAUGE;  Surgeon:  David Brook, MD;  Location: [place];  Service: Ophthalmology;  Laterality: Right;  . PARS PLANA VITRECTOMY Left 02/17/2015  . PARS PLANA VITRECTOMY Left 02/17/2015   Procedure: PARS PLANA VITRECTOMY WITH 25G REMOVAL/SUTURE INTRAOCULAR LENS;  Surgeon: Hayden Pedro, MD;  Location: [place];  Service: Ophthalmology;  Laterality: Left;  . PERIPHERAL VASCULAR CATHETERIZATION Left 08/08/2016   Procedure: A/V Shuntogram/Fistulagram;  Surgeon: David Huxley, MD;  Location: [place] [facility];  Service: Cardiovascular;  Laterality: Left;  . PERIPHERAL VASCULAR CATHETERIZATION N/A 08/08/2016   Procedure: A/V Shunt Intervention;  Surgeon: David Huxley, MD;  Location: [place] [facility];  Service: Cardiovascular;  Laterality: N/A;  . PHOTOCOAGULATION WITH LASER Right 09/04/2013   Procedure: PHOTOCOAGULATION WITH LASER;  Surgeon: David Brook, MD;  Location: [place];  Service: Ophthalmology;  Laterality: Right;  ENDOLASER  . TRANSTHORACIC ECHOCARDIOGRAM  06/19/2012   normal LV sys function, EF 55-60%, mild to mod diastolic dysfunction, mild AI ([name])  tonsillectomy @ 73 years of age   FAMILY HISTORY: Family History  Problem Relation Age of Onset  . Breast cancer Mother 57  deceased  . Pneumonia Father 36       deceased  . Hypertension Brother   . Diabetes Mellitus II Brother   . Cancer Sister        Intestinal   The patient's s father died from pneumonia at age 46.  He was originally a Psychologist, sport and exercise and later worked in a Designer, television/film set.  Patients' mother died from breast cancer at age 10. The patient has 7 siblings. Patient denies anyone in her family having breast, ovarian, prostate, or pancreatic cancer.  1 sister died from intestinal cancer.  Another sister, Katharine Look, died from an aneurism.     SOCIAL HISTORY: (Current as of 04/22/2019) [name] retired from the [employer] office. 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 He notes that he was exposed to agent orange.  His wife, [name], is a retired Corporate treasurer. They have two children, Lennette Savage and Lauren. Lennette Savage lives in [place] and works at the [employer] for [employer]. Lauren lives in [place] and teaches handicapped children for the [place] school system.Kennith Center has two grandchildren.  His daughter is expecting in August 2020. The patient he belongs to [employer] on [place]: In the absence of any documentation, [name]'s spouse is automatically her healthcare power of attorney.     HEALTH MAINTENANCE: Social History   Tobacco Use  . Smoking status: Former Smoker    Types: Cigarettes    Quit date: 10/02/1990    Years since quitting: 28.7  . Smokeless tobacco: Never Used  Substance Use Topics  . Alcohol use: Yes    Comment: occasional  . Drug use: No    Colonoscopy: up to date/ [facility]  Allergies  Allergen Reactions  . Codeine Nausea And Vomiting and Nausea Only  . Doxycycline Nausea And Vomiting and Nausea Only  . Latex Hives and Rash  . Tape Other (See Comments)    The "plastic, waffle-printed" tape tears and bruises the skin    Current Outpatient Medications  Medication Sig Dispense Refill  . acetaminophen (TYLENOL) 325 MG tablet Take 650 mg by mouth every 6 (six) hours as needed (for pain or headaches).     [name] allopurinol (ZYLOPRIM) 300 MG tablet Take 300 mg by mouth as needed (as directed for gout flares).     [name] amLODipine (NORVASC) 5 MG tablet Take 1 tablet (5 mg total) by mouth daily. 30 tablet 0  . betamethasone dipropionate (DIPROLENE) 0.05 % ointment     . cloNIDine (CATAPRES) 0.1 MG tablet Take 0.1 mg by mouth 3 (three) times daily.    [name] donepezil (ARICEPT) 10 MG tablet Take 10 mg by mouth at bedtime.    . dorzolamide (TRUSOPT) 2 % ophthalmic solution Place 1 drop into both eyes 2 (two) times a day.     . doxazosin (CARDURA) 4 MG tablet Take 4 mg by mouth 2 (two) times daily.    . furosemide (LASIX) 40 MG tablet Take 40-80 mg by mouth See admin  instructions. Take 80 mg by mouth in the morning and 40 mg in the evening    . HUMALOG KWIKPEN 100 UNIT/ML KiwkPen Inject 8-16 Units into the skin See admin instructions. Inject 8-16 units into the skin three times a day before meals, PER SLIDING SCALE (BGL 150 or less, use nothing)  3  . Insulin Glargine (LANTUS SOLOSTAR) 100 UNIT/ML Solostar Pen Inject 34 Units into the skin daily before breakfast.     .  K Phos Mono-Sod Phos Di & Mono (PHOSPHA 250 NEUTRAL) 155-852-130 MG TABS Take 1-2 tablets by mouth See admin instructions. Take 2 tablets by mouth in the morning and 1 tablet at bedtime at 8 PM    . ketoconazole (NIZORAL) 2 % shampoo Apply 1 application topically every 7 (seven) days.   2  . KLOR-CON M20 20 MEQ tablet Take 20 mEq by mouth daily.  1  . latanoprost (XALATAN) 0.005 % ophthalmic solution Place 1 drop into both eyes at bedtime.     [name] linagliptin (TRADJENTA) 5 MG TABS tablet Take 5 mg by mouth daily with breakfast.     . losartan (COZAAR) 100 MG tablet Take 100 mg by mouth daily.    . metoprolol tartrate (LOPRESSOR) 25 MG tablet Take 25 mg by mouth 2 (two) times daily.    . Multiple Vitamins-Minerals (ONE-A-DAY MENS 50+ ADVANTAGE) TABS Take 1 tablet by mouth daily with breakfast.    . mupirocin cream (BACTROBAN) 2 % Apply 1 application topically 2 (two) times daily. 15 g 0  . mycophenolate (MYFORTIC) 180 MG EC tablet Take 1 tablet (180 mg total) by mouth 2 (two) times daily. 60 tablet 0  . NON FORMULARY Apply 1 application topically See admin instructions. Skin-So-Soft lotion: Apply as needed to dry sites of skin    . ondansetron (ZOFRAN) 8 MG tablet Take 1 tablet (8 mg total) by mouth 2 (two) times daily as needed (Nausea or vomiting). 30 tablet 1  . sodium chloride (OCEAN) 0.65 % SOLN nasal spray Place 2 sprays into both nostrils as needed for congestion.     . sulfamethoxazole-trimethoprim (BACTRIM) 400-80 MG tablet Take 1 tablet by mouth every Monday, Wednesday, and Friday.    .  tacrolimus (PROGRAF) 0.5 MG capsule Take 0.5 mg by mouth 2 (two) times daily.    . tacrolimus (PROGRAF) 1 MG capsule Take 3 mg by mouth 2 (two) times daily.   11  . tadalafil (CIALIS) 20 MG tablet Take 20 mg by mouth daily as needed for erectile dysfunction.     [name] VIGAMOX 0.5 % ophthalmic solution INSTILL 1 DROP INTO RIGHT EYE 4 TIMES A DAY    . Vitamin D, Ergocalciferol, (DRISDOL) 50000 units CAPS capsule Take 50,000 Units by mouth every 7 (seven) days.      No current facility-administered medications for this visit.      OBJECTIVE: Older African-American man in no acute distress  Vitals:   07/11/19 1516  BP: (!) 148/88  Pulse: 65  Resp: 18  Temp: 98.7 F (37.1 C)  SpO2: 99%     Body mass index is 27.93 kg/m.   Wt Readings from Last 3 Encounters:  07/11/19 205 lb 14.4 oz (93.4 kg)  06/19/19 214 lb 1.6 oz (97.1 kg)  06/05/19 209 lb 14.4 oz (95.2 kg)      ECOG FS:2 - Symptomatic, <50% confined to bed   Sclerae unicteric, EOMs intact Wearing a mask No cervical or supraclavicular adenopathy Lungs no rales or rhonchi Heart regular rate and rhythm Abd soft, nontender, positive bowel sounds MSK no focal spinal tenderness, no upper extremity lymphedema Neuro: nonfocal, well oriented, appropriate affect     LAB RESULTS:   CMP     Component Value Date/Time   NA 142 07/08/2019 1358   NA 135 (L) 07/04/2013 1256   K 3.5 07/08/2019 1358   K 4.4 07/11/2013 1108   CL 108 07/08/2019 1358   CL 96 (L) 07/04/2013 1256   CO2  25 07/08/2019 1358   CO2 32 07/04/2013 1256   GLUCOSE 98 07/08/2019 1358   GLUCOSE 189 (H) 07/04/2013 1256   BUN 11 07/08/2019 1358   BUN 26 (H) 07/04/2013 1256   CREATININE 1.25 (H) 07/08/2019 1358   CREATININE 1.35 (H) 04/26/2019 1455   CREATININE 10.86 (H) 07/04/2013 1256   CALCIUM 10.0 07/08/2019 1358   CALCIUM 8.8 07/04/2013 1256   PROT 7.0 07/08/2019 1358   ALBUMIN 3.9 07/08/2019 1358   AST 13 (L) 07/08/2019 1358   AST 23 04/26/2019 1455    ALT 9 07/08/2019 1358   ALT 19 04/26/2019 1455   ALKPHOS 108 07/08/2019 1358   BILITOT 1.0 07/08/2019 1358   BILITOT 0.9 04/26/2019 1455   GFRNONAA 58 (L) 07/08/2019 1358   GFRNONAA 53 (L) 04/26/2019 1455   GFRNONAA 4 (L) 07/04/2013 1256   GFRAA >60 07/08/2019 1358   GFRAA >60 04/26/2019 1455   GFRAA 5 (L) 07/04/2013 1256    No results found for: TOTALPROTELP, ALBUMINELP, A1GS, A2GS, BETS, BETA2SER, GAMS, MSPIKE, SPEI  No results found for: KPAFRELGTCHN, LAMBDASER, KAPLAMBRATIO  Lab Results  Component Value Date   WBC 7.0 07/08/2019   NEUTROABS 2.9 07/08/2019   HGB 7.8 (L) 07/08/2019   HCT 23.7 (L) 07/08/2019   MCV 94.0 07/08/2019   PLT 37 (L) 07/08/2019    '@LASTCHEMISTRY'$ @  No results found for: LABCA2  No components found for: JGGEZM629  No results for input(s): INR in the last 168 hours.  No results found for: LABCA2  No results found for: UTM546  No results found for: TKP546  No results found for: FKC127  No results found for: CA2729  No components found for: HGQUANT  No results found for: CEA1 / No results found for: CEA1   No results found for: AFPTUMOR  No results found for: CHROMOGRNA  No results found for: PSA1  No visits with results within 3 Day(s) from this visit.  Latest known visit with results is:  Appointment on 07/08/2019  Component Date Value Ref Range Status  . Blood Bank Specimen 07/08/2019 SAMPLE AVAILABLE FOR TESTING   Final  . Sample Expiration 07/08/2019    Final                   Value:07/11/2019,2359 Performed at [facility], [place] [address] [place], [place] [address]   . Ferritin 07/08/2019 2,685* 24 - 336 ng/mL Final   Performed at [facility], [place] [address], [place], [place] [address]  . Retic Ct Pct 07/08/2019 2.1  0.4 - 3.1 % Final  . RBC. 07/08/2019 2.52* 4.22 - 5.81 MIL/uL Final  . Retic Count, Absolute 07/08/2019 52.9  19.0 - 186.0 K/uL Final  . Immature Retic Fract  07/08/2019 24.6* 2.3 - 15.9 % Final   Performed at [facility], [place] [address] [place], [place] [address]  . LDH 07/08/2019 327* 98 - 192 U/L Final   Performed at [facility], [place] [address] [place], [place] [address]  . Sodium 07/08/2019 142  135 - 145 mmol/L Final  . Potassium 07/08/2019 3.5  3.5 - 5.1 mmol/L Final  . Chloride 07/08/2019 108  98 - 111 mmol/L Final  . CO2 07/08/2019 25  22 - 32 mmol/L Final  . Glucose, Bld 07/08/2019 98  70 - 99 mg/dL Final  . BUN 07/08/2019 11  8 - 23 mg/dL Final  . Creatinine, Ser 07/08/2019 1.25* 0.61 - 1.24 mg/dL  Final  . Calcium 07/08/2019 10.0  8.9 - 10.3 mg/dL Final  . Total Protein 07/08/2019 7.0  6.5 - 8.1 g/dL Final  . Albumin 07/08/2019 3.9  3.5 - 5.0 g/dL Final  . AST 07/08/2019 13* 15 - 41 U/L Final  . ALT 07/08/2019 9  0 - 44 U/L Final  . Alkaline Phosphatase 07/08/2019 108  38 - 126 U/L Final  . Total Bilirubin 07/08/2019 1.0  0.3 - 1.2 mg/dL Final  . GFR calc non Af Amer 07/08/2019 58* >60 mL/min Final  . GFR calc Af Amer 07/08/2019 >60  >60 mL/min Final  . Anion gap 07/08/2019 9  5 - 15 Final   Performed at [facility], [place] [address], [place], [place] [address]  . WBC 07/08/2019 7.0  4.0 - 10.5 K/uL Final  . RBC 07/08/2019 2.52* 4.22 - 5.81 MIL/uL Final  . Hemoglobin 07/08/2019 7.8* 13.0 - 17.0 g/dL Final  . HCT 07/08/2019 23.7* 39.0 - 52.0 % Final  . MCV 07/08/2019 94.0  80.0 - 100.0 fL Final  . MCH 07/08/2019 31.0  26.0 - 34.0 pg Final  . MCHC 07/08/2019 32.9  30.0 - 36.0 g/dL Final  . RDW 07/08/2019 18.3* 11.5 - 15.5 % Final  . Platelets 07/08/2019 37* 150 - 400 K/uL Final   Comment: Immature Platelet Fraction may be clinically indicated, consider ordering this additional test [id number]   . nRBC 07/08/2019 2.3* 0.0 - 0.2 % Final  . Neutrophils Relative % 07/08/2019 41  % Final  . Neutro Abs 07/08/2019 2.9  1.7 - 7.7 K/uL Final  . Lymphocytes  Relative 07/08/2019 20  % Final  . Lymphs Abs 07/08/2019 1.4  0.7 - 4.0 K/uL Final  . Monocytes Relative 07/08/2019 14  % Final  . Monocytes Absolute 07/08/2019 1.0  0.1 - 1.0 K/uL Final  . Eosinophils Relative 07/08/2019 0  % Final  . Eosinophils Absolute 07/08/2019 0.0  0.0 - 0.5 K/uL Final  . Basophils Relative 07/08/2019 0  % Final  . Basophils Absolute 07/08/2019 0.0  0.0 - 0.1 K/uL Final  . Immature Granulocytes 07/08/2019 25  % Final   metas and myelocytes observed  . Abs Immature Granulocytes 07/08/2019 1.75* 0.00 - 0.07 K/uL Final  . Polychromasia 07/08/2019 PRESENT   Final  . Ovalocytes 07/08/2019 PRESENT   Final   Performed at [facility], [place] [address] [place], [place] [address]    (this displays the last labs from the last 3 days)  No results found for: TOTALPROTELP, ALBUMINELP, A1GS, A2GS, BETS, BETA2SER, GAMS, MSPIKE, SPEI (this displays SPEP labs)  No results found for: KPAFRELGTCHN, LAMBDASER, KAPLAMBRATIO (kappa/lambda light chains)  No results found for: HGBA, HGBA2QUANT, HGBFQUANT, HGBSQUAN (Hemoglobinopathy evaluation)   Lab Results  Component Value Date   LDH 327 (H) 07/08/2019    Lab Results  Component Value Date   IRON 134 03/23/2019   TIBC 326 03/23/2019   IRONPCTSAT 41 (H) 03/23/2019   (Iron and TIBC)  Lab Results  Component Value Date   FERRITIN 2,685 (H) 07/08/2019    Urinalysis    Component Value Date/Time   COLORURINE YELLOW 03/22/2019 [place] 03/22/2019 1609   LABSPEC 1.010 03/22/2019 1609   PHURINE 6.0 03/22/2019 1609   GLUCOSEU NEGATIVE 03/22/2019 [place] 03/22/2019 [place] 03/22/2019 [place] 03/22/2019 [place] 03/22/2019 1609   UROBILINOGEN 0.2 08/17/2012 1120  NITRITE NEGATIVE 03/22/2019 1609   LEUKOCYTESUR NEGATIVE 03/22/2019 1609     STUDIES:  No results found.   ELIGIBLE FOR AVAILABLE RESEARCH  PROTOCOL: no   ASSESSMENT: 73 y.o. [place] man with a history of end-stage renal disease secondary to hypertension and diabetes, on hemodialysis as of 2010  (a) status post renal transplant at [facility] 03/31/2015  (1) pancytopenia noted 03/22/2019 with fatigue and shortness of breath  (a) smear review confirmed pancytopenia and macrocytosis with hypogranular polys   (b) Bone Marrow Biopsy on 05/02/2019 shows hypercellular marrow with trilineal dysplasia, slightly left shifted myeloid maturation but no increase in blast, mild myelofibrosis  (c) Flow Cytometry shows no increase in CD34 positive blasts and left shifted myeloid maturation   (d) 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   (2) myelodysplasia, as per #1 above  (a) prognosis: Moderate risk  (3) aranesp started 05/22/2019, repeated weekly  (a) baseline epo level >500, which predicts a poor response to EPO  (b) ferritin 1060 on 03/23/2019  (4) is azacytidine started 07/08/2019, to be repeated daily x5  q. 21 days  PLAN:  [name] remains anemic, but currently is not symptomatic and we are holding off on transfusion.  He is tolerating the is azacytidine well.  I do not think the diarrhea he experienced this morning was related.  We discussed giving the drug 7 days as originally done but it is frequently dosed for 5 days which is more convenient for patients.  However today [name] left after our visit so he will have received it 3 days then off a day then 1 day and then off 2 days and then again next Monday.  We will get this regularize when he starts his second cycle on October 19  I hope we will see significant response after the first 3-6 cycles.  We are continuing the weekly EPO injections.  They are taking appropriate pandemic precautions.  He knows to call for any  other issue that may develop before the next visit.  David Bihari, NP 07/11/19 5:20 PM Medical Oncology and Hematology [facility] [address] [place], [place] [address] Tel. [phone]    Fax. [phone]

## 2019-07-12 ENCOUNTER — Inpatient Hospital Stay: Payer: Medicare Other

## 2019-07-12 ENCOUNTER — Other Ambulatory Visit: Payer: Self-pay

## 2019-07-12 ENCOUNTER — Telehealth: Payer: Self-pay | Admitting: *Deleted

## 2019-07-12 ENCOUNTER — Ambulatory Visit: Payer: Medicare Other

## 2019-07-12 VITALS — BP 143/45 | HR 65 | Temp 99.1°F | Resp 16

## 2019-07-12 DIAGNOSIS — D696 Thrombocytopenia, unspecified: Secondary | ICD-10-CM

## 2019-07-12 DIAGNOSIS — D469 Myelodysplastic syndrome, unspecified: Secondary | ICD-10-CM

## 2019-07-12 DIAGNOSIS — Z5111 Encounter for antineoplastic chemotherapy: Secondary | ICD-10-CM | POA: Diagnosis not present

## 2019-07-12 MED ORDER — ONDANSETRON HCL 8 MG PO TABS
8.0000 mg | ORAL_TABLET | Freq: Once | ORAL | Status: AC
  Administered 2019-07-12: 14:00:00 8 mg via ORAL

## 2019-07-12 MED ORDER — AZACITIDINE CHEMO SQ INJECTION
75.0000 mg/m2 | Freq: Once | INTRAMUSCULAR | Status: AC
  Administered 2019-07-12: 165 mg via SUBCUTANEOUS
  Filled 2019-07-12: qty 6.6

## 2019-07-12 MED ORDER — ONDANSETRON HCL 8 MG PO TABS
ORAL_TABLET | ORAL | Status: AC
  Filled 2019-07-12: qty 1

## 2019-07-12 NOTE — Patient Instructions (Signed)
[facility] Discharge Instructions for Patients Receiving Chemotherapy  Today you received the following chemotherapy agents Vidaza  To help prevent nausea and vomiting after your treatment, we encourage you to take your nausea medication as directed  If you develop nausea and vomiting that is not controlled by your nausea medication, call the clinic.   BELOW ARE SYMPTOMS THAT SHOULD BE REPORTED IMMEDIATELY:  *FEVER GREATER THAN 100.5 F  *CHILLS WITH OR WITHOUT FEVER  NAUSEA AND VOMITING THAT IS NOT CONTROLLED WITH YOUR NAUSEA MEDICATION  *UNUSUAL SHORTNESS OF BREATH  *UNUSUAL BRUISING OR BLEEDING  TENDERNESS IN MOUTH AND THROAT WITH OR WITHOUT PRESENCE OF ULCERS  *URINARY PROBLEMS  *BOWEL PROBLEMS  UNUSUAL RASH Items with * indicate a potential emergency and should be followed up as soon as possible.  Feel free to call the clinic should you have any questions or concerns. The clinic phone number is [phone].  Please show the CHEMO ALERT CARD at check-in to the Emergency Department and triage nurse.   

## 2019-07-12 NOTE — Telephone Encounter (Signed)
This RN spoke with pt per missed injection post MD visit yesterday " oh I got confused and forgot ".  Per MD pt will need 5th injection rescheduled to 9/28.  Above discussed with pt who is in agreement.  Urgent LOS sent to schedule inj.

## 2019-07-14 NOTE — Progress Notes (Addendum)
[facility]  [phone] [phone]    ID: [name] DOB: Jul 28, 1946  MR#: [id number]  ZHY#:[id number]  Patient Care Team: Rogers Blocker, MD as PCP - General (Internal Medicine) Magrinat, Virgie Dad, MD as Consulting Physician (Oncology) Donato Heinz, MD as Consulting Physician (Nephrology) Ander Slade, Carlisle Beers, MD as Referring Physician (Ophthalmology) [name] Erskine Squibb, MD as Referring Physician (Vascular Surgery) OTHER MD:  CHIEF COMPLAINT: MDS  CURRENT TREATMENT: Retacrit, azacytidine   INTERVAL HISTORY: [name] returns today for follow-up and treatment of MDS. He was last seen here on 07/11/2019.   He continues on Retacrit.  He receives the dose today and every Monday.  So far there has not been a clear reticulocyte response Results for ANIS, DEGIDIO (MRN [id number]) as of 07/15/2019 13:53  Ref. Range 05/22/2019 13:18 06/05/2019 10:26 06/12/2019 14:03 06/26/2019 13:18 07/08/2019 13:58  Retic Count, Absolute Latest Ref Range: 19.0 - 186.0 K/uL 72.3 63.8 57.0 34.6 52.9   He also continues on azacytidine.  The first cycle he was only scheduled for 5 doses.  He was supposed to get the fifth dose last Friday but he just walked on out and forgot about it so he is getting that today together with his hematocrit.  Otherwise he tolerated it well, with no significant fatigue, fever, pain, or other side effects.  Since his last visit here, he has not undergone any additional studies.    REVIEW OF SYSTEMS: Keshan takes a small walk every day.  Not today he says because it is rainy.  He has had his granddaughter in the home over the weekend and enjoyed that.  He denies shortness of breath, chest pain, syncopal symptoms, or unexplained fatigue or weight loss.  There is been no intercurrent bleeding or bruising.  A detailed review of systems was otherwise stable   HISTORY OF CURRENT ILLNESS: From the original intake note:  [name] is a  73 year old [place] man with a history of type 2 diabetes complicated by significant retinopathy and end-stage renal disease, status post renal transplant, followed by Dr. Marval Regal.  [name], and that last time he felt this way, his Vitamin D levels were low. He called his doctors office to ask for his blood work to be ran before he was sent to the ED by Dr. Marlou Sa. The patient presented to the emergency room 03/22/2019 with symptomatic anemia, was found to have hemoglobin of 5.9 and pancytopenia.  Additional work-up is listed below in the assessment section, but there was no evidence of B12, folate or iron deficiency and reticulocyte count was inappropriately normal.  HIV and SARS coronavirus 2 tests were negative.  Creatinine was 1.49 with GFR was 54.  Glucose was 221.  Other labs of interest obtained 03/23/2019 included a B12 level of 944, folate 13.2, ferritin 1060, with serum iron 134, saturation 41%.  The absolute reticulocyte count was 64.5.  His end-stage renal disease is felt to be due to diabetes and hypertension.  He was on dialysis starting 2010 but on 03/31/2015 he underwent a cadaveric renal transplant at [facility].  He had transient CMV viremia, which was treated.  He continues on Bactrim prophylaxis and tacrolimus.  He also receives mycophenolate and when he had the episode of severe anemia and a mycophenolate was decreased in half and his Bactrim was decreased to Monday Wednesday Friday.  His tacrolimus level was on the high side.  This has been  since adjusted by Dr. Marval Regal and is being repeated today  The patient was transfused and discharged and referred here for evaluation of pancytopenia   PAST MEDICAL HISTORY: Past Medical History:  Diagnosis Date   Anemia    hx of at beginnning of dialysis- 05/2009   Arthritis    right hip    End stage renal disease ([place])    [place]     End stage renal disease on dialysis [facility])    Essential hypertension, malignant    Hypertrophy of prostate without urinary obstruction and other lower urinary tract symptoms (LUTS)    Myelodysplasia (myelodysplastic syndrome) ([place]) 05/20/2019   Pure hypercholesterolemia    Renal insufficiency    Type II or unspecified type diabetes mellitus without mention of complication, not stated as uncontrolled    TYPE 2      PAST SURGICAL HISTORY: Past Surgical History:  Procedure Laterality Date   A/V FISTULAGRAM Left 06/12/2017   Procedure: A/V Fistulagram;  Surgeon: Algernon Huxley, MD;  Location: [facility];  Service: Cardiovascular;  Laterality: Left;   CARDIAC CATHETERIZATION  04/29/10   normal coronaries, LVEF 45%, mild global LV hypokinesis ([place])   CATARACT EXTRACTION W/PHACO Right 04/03/2013   Procedure: CATARACT EXTRACTION PHACO AND INTRAOCULAR LENS PLACEMENT ([place]);  Surgeon: Adonis Brook, MD;  Location: [facility];  Service: Ophthalmology;  Laterality: Right;   CERVICAL DISCECTOMY  03/1999   COLONOSCOPY     Hx: of   CYSTOSCOPY WITH BIOPSY  11/01/2012   Procedure: CYSTOSCOPY WITH BIOPSY;  Surgeon: Molli Hazard, MD;  Location: [facility];  Service: Urology;  Laterality: N/A;  with fulgeration   CYSTOSCOPY WITH RETROGRADE PYELOGRAM, URETEROSCOPY AND STENT PLACEMENT  11/01/2012   Procedure: CYSTOSCOPY WITH RETROGRADE PYELOGRAM, URETEROSCOPY AND STENT PLACEMENT;  Surgeon: Molli Hazard, MD;  [facility];  Service: Urology;  Laterality: Bilateral;   DIALYSIS FISTULA CREATION  2010   EYE SURGERY  2012   cataracts   EYE SURGERY  11/2014   HOLMIUM LASER APPLICATION  7/54/4920   Procedure: HOLMIUM LASER APPLICATION;  Surgeon: Molli Hazard, MD;  [facility];  Service: Urology;  Laterality: Bilateral;   KIDNEY TRANSPLANT  2016   MEMBRANE PEEL Right 09/04/2013   Procedure: MEMBRANE PEEL;  Surgeon: Adonis Brook, MD;  Location: [facility];   Service: Ophthalmology;  Laterality: Right;   NECK SURGERY  1990   PARS PLANA VITRECTOMY Right 09/04/2013   Procedure: PARS PLANA VITRECTOMY WITH 23 GAUGE;  Surgeon: Adonis Brook, MD;  Location: [facility];  Service: Ophthalmology;  Laterality: Right;   PARS PLANA VITRECTOMY Left 02/17/2015   PARS PLANA VITRECTOMY Left 02/17/2015   Procedure: PARS PLANA VITRECTOMY WITH 25G REMOVAL/SUTURE INTRAOCULAR LENS;  Surgeon: Hayden Pedro, MD;  Location: [facility];  Service: Ophthalmology;  Laterality: Left;   PERIPHERAL VASCULAR CATHETERIZATION Left 08/08/2016   Procedure: A/V Shuntogram/Fistulagram;  Surgeon: Algernon Huxley, MD;  Location: [facility];  Service: Cardiovascular;  Laterality: Left;   PERIPHERAL VASCULAR CATHETERIZATION N/A 08/08/2016   Procedure: A/V Shunt Intervention;  Surgeon: Algernon Huxley, MD;  Location: [facility];  Service: Cardiovascular;  Laterality: N/A;   PHOTOCOAGULATION WITH LASER Right 09/04/2013   Procedure: PHOTOCOAGULATION WITH LASER;  Surgeon: Adonis Brook, MD;  Location: [facility];  Service: Ophthalmology;  Laterality: Right;  ENDOLASER   TRANSTHORACIC ECHOCARDIOGRAM  06/19/2012   normal LV sys function, EF 55-60%, mild to mod diastolic dysfunction, mild AI (  [place])  tonsillectomy @ 73 years of age   FAMILY HISTORY: Family History  Problem Relation Age of Onset   Breast cancer Mother 18       deceased   Pneumonia Father 44       deceased   Hypertension Brother    Diabetes Mellitus II Brother    Cancer Sister        Intestinal   The patient's s father died from pneumonia at age 13.  He was originally a Psychologist, sport and exercise and later worked in a Designer, television/film set.  Patients' mother died from breast cancer at age 41. The patient has 7 siblings. Patient denies anyone in her family having breast, ovarian, prostate, or pancreatic cancer.  1 sister died from intestinal cancer.  Another sister, Katharine Look, died from an aneurism.     SOCIAL HISTORY: (Current  as of 04/22/2019) Mr. Cardosa retired from the [employer] office. 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 He notes that he was exposed to agent orange. His wife, [name], is a retired Corporate treasurer. They have two children, Lennette Bihari and Lauren. Lennette Bihari lives in [place] and works at the [employer] for [employer]. Lauren lives in [place] and teaches handicapped children for the [place] school [employer].Kennith Center has two grandchildren.  His daughter is expecting in August 2020. The patient he belongs to [employer] on [place]: In the absence of any documentation, Jesiel's spouse is automatically her healthcare power of attorney.     HEALTH MAINTENANCE: Social History   Tobacco Use   Smoking status: Former Smoker    Types: Cigarettes    Quit date: 10/02/1990    Years since quitting: 28.8   Smokeless tobacco: Never Used  Substance Use Topics   Alcohol use: Yes    Comment: occasional   Drug use: No    Colonoscopy: up to date/ [facility]  Allergies  Allergen Reactions   Codeine Nausea And Vomiting and Nausea Only   Doxycycline Nausea And Vomiting and Nausea Only   Latex Hives and Rash   Tape Other (See Comments)    The "plastic, waffle-printed" tape tears and bruises the skin    Current Outpatient Medications  Medication Sig Dispense Refill   acetaminophen (TYLENOL) 325 MG tablet Take 650 mg by mouth every 6 (six) hours as needed (for pain or headaches).      allopurinol (ZYLOPRIM) 300 MG tablet Take 300 mg by mouth as needed (as directed for gout flares).      amLODipine (NORVASC) 5 MG tablet Take 1 tablet (5 mg total) by mouth daily. 30 tablet 0   betamethasone dipropionate (DIPROLENE) 0.05 % ointment      cloNIDine (CATAPRES) 0.1 MG tablet Take 0.1 mg by mouth 3 (three) times daily.     donepezil (ARICEPT) 10 MG tablet Take 10 mg by mouth at bedtime.     dorzolamide (TRUSOPT) 2 % ophthalmic solution Place 1 drop into both eyes 2  (two) times a day.      doxazosin (CARDURA) 4 MG tablet Take 4 mg by mouth 2 (two) times daily.     furosemide (LASIX) 40 MG tablet Take 40-80 mg by mouth See admin instructions. Take 80 mg by mouth in the morning and 40 mg in the evening     HUMALOG KWIKPEN 100 UNIT/ML KiwkPen Inject 8-16 Units into the skin See admin instructions. Inject 8-16 units into the skin three times a day before meals, PER SLIDING SCALE (  BGL 150 or less, use nothing)  3   Insulin Glargine (LANTUS SOLOSTAR) 100 UNIT/ML Solostar Pen Inject 34 Units into the skin daily before breakfast.      K Phos Mono-Sod Phos Di & Mono (PHOSPHA 250 NEUTRAL) [phone] MG TABS Take 1-2 tablets by mouth See admin instructions. Take 2 tablets by mouth in the morning and 1 tablet at bedtime at 8 PM     ketoconazole (NIZORAL) 2 % shampoo Apply 1 application topically every 7 (seven) days.   2   KLOR-CON M20 20 MEQ tablet Take 20 mEq by mouth daily.  1   latanoprost (XALATAN) 0.005 % ophthalmic solution Place 1 drop into both eyes at bedtime.      linagliptin (TRADJENTA) 5 MG TABS tablet Take 5 mg by mouth daily with breakfast.      losartan (COZAAR) 100 MG tablet Take 100 mg by mouth daily.     metoprolol tartrate (LOPRESSOR) 25 MG tablet Take 25 mg by mouth 2 (two) times daily.     Multiple Vitamins-Minerals (ONE-A-DAY MENS 50+ ADVANTAGE) TABS Take 1 tablet by mouth daily with breakfast.     mupirocin cream (BACTROBAN) 2 % Apply 1 application topically 2 (two) times daily. 15 g 0   mycophenolate (MYFORTIC) 180 MG EC tablet Take 1 tablet (180 mg total) by mouth 2 (two) times daily. 60 tablet 0   NON FORMULARY Apply 1 application topically See admin instructions. Skin-So-Soft lotion: Apply as needed to dry sites of skin     ondansetron (ZOFRAN) 8 MG tablet Take 1 tablet (8 mg total) by mouth 2 (two) times daily as needed (Nausea or vomiting). 30 tablet 1   sodium chloride (OCEAN) 0.65 % SOLN nasal spray Place 2 sprays into both  nostrils as needed for congestion.      sulfamethoxazole-trimethoprim (BACTRIM) 400-80 MG tablet Take 1 tablet by mouth every Monday, Wednesday, and Friday.     tacrolimus (PROGRAF) 0.5 MG capsule Take 0.5 mg by mouth 2 (two) times daily.     tacrolimus (PROGRAF) 1 MG capsule Take 3 mg by mouth 2 (two) times daily.   11   tadalafil (CIALIS) 20 MG tablet Take 20 mg by mouth daily as needed for erectile dysfunction.      VIGAMOX 0.5 % ophthalmic solution INSTILL 1 DROP INTO RIGHT EYE 4 TIMES A DAY     Vitamin D, Ergocalciferol, (DRISDOL) 50000 units CAPS capsule Take 50,000 Units by mouth every 7 (seven) days.      No current facility-administered medications for this visit.      OBJECTIVE: Older African-American man who appears stated age  There were no vitals filed for this visit. Wt Readings from Last 3 Encounters:  07/11/19 205 lb 14.4 oz (93.4 kg)  06/19/19 214 lb 1.6 oz (97.1 kg)  06/05/19 209 lb 14.4 oz (95.2 kg)   There is no height or weight on file to calculate BMI.    ECOG FS:2 - Symptomatic, <50% confined to bed  Ocular: Sclerae unicteric, pupils round and equal Ear-nose-throat: Wearing a mask Lymphatic: No cervical or supraclavicular adenopathy Lungs no rales or rhonchi Heart regular rate and rhythm Abd soft, nontender, positive bowel sounds MSK no focal spinal tenderness, no joint edema Neuro: non-focal, well-oriented, appropriate affect  LAB RESULTS:   CMP     Component Value Date/Time   NA 140 07/15/2019 1314   NA 135 (L) 07/04/2013 1256   K 3.9 07/15/2019 1314   K 4.4 07/11/2013 1108  CL 106 07/15/2019 1314   CL 96 (L) 07/04/2013 1256   CO2 28 07/15/2019 1314   CO2 32 07/04/2013 1256   GLUCOSE 151 (H) 07/15/2019 1314   GLUCOSE 189 (H) 07/04/2013 1256   BUN 17 07/15/2019 1314   BUN 26 (H) 07/04/2013 1256   CREATININE 1.36 (H) 07/15/2019 1314   CREATININE 1.35 (H) 04/26/2019 1455   CREATININE 10.86 (H) 07/04/2013 1256   CALCIUM 10.2 07/15/2019  1314   CALCIUM 8.8 07/04/2013 1256   PROT 6.8 07/15/2019 1314   ALBUMIN 3.8 07/15/2019 1314   AST 11 (L) 07/15/2019 1314   AST 23 04/26/2019 1455   ALT 7 07/15/2019 1314   ALT 19 04/26/2019 1455   ALKPHOS 89 07/15/2019 1314   BILITOT 1.2 07/15/2019 1314   BILITOT 0.9 04/26/2019 1455   GFRNONAA 52 (L) 07/15/2019 1314   GFRNONAA 53 (L) 04/26/2019 1455   GFRNONAA 4 (L) 07/04/2013 1256   GFRAA >60 07/15/2019 1314   GFRAA >60 04/26/2019 1455   GFRAA 5 (L) 07/04/2013 1256    No results found for: TOTALPROTELP, ALBUMINELP, A1GS, A2GS, BETS, BETA2SER, GAMS, MSPIKE, SPEI  No results found for: KPAFRELGTCHN, LAMBDASER, KAPLAMBRATIO  Lab Results  Component Value Date   WBC 3.7 (L) 07/15/2019   NEUTROABS 1.3 (L) 07/15/2019   HGB 7.1 (L) 07/15/2019   HCT 22.0 (L) 07/15/2019   MCV 95.7 07/15/2019   PLT 35 (L) 07/15/2019    _0 @  No results found for: LABCA2  No components found for: IRCVEL381  No results for input(s): INR in the last 168 hours.  No results found for: LABCA2  No results found for: OFB510  No results found for: CHE527  No results found for: POE423  No results found for: CA2729  No components found for: HGQUANT  No results found for: CEA1 / No results found for: CEA1   No results found for: AFPTUMOR  No results found for: CHROMOGRNA  No results found for: PSA1  Appointment on 07/15/2019  Component Date Value Ref Range Status   Sodium 07/15/2019 140  135 - 145 mmol/L Final   Potassium 07/15/2019 3.9  3.5 - 5.1 mmol/L Final   Chloride 07/15/2019 106  98 - 111 mmol/L Final   CO2 07/15/2019 28  22 - 32 mmol/L Final   Glucose, Bld 07/15/2019 151* 70 - 99 mg/dL Final   BUN 07/15/2019 17  8 - 23 mg/dL Final   Creatinine, Ser 07/15/2019 1.36* 0.61 - 1.24 mg/dL Final   Calcium 07/15/2019 10.2  8.9 - 10.3 mg/dL Final   Total Protein 07/15/2019 6.8  6.5 - 8.1 g/dL Final   Albumin 07/15/2019 3.8  3.5 - 5.0 g/dL Final   AST 07/15/2019  11* 15 - 41 U/L Final   ALT 07/15/2019 7  0 - 44 U/L Final   Alkaline Phosphatase 07/15/2019 89  38 - 126 U/L Final   Total Bilirubin 07/15/2019 1.2  0.3 - 1.2 mg/dL Final   GFR calc non Af Amer 07/15/2019 52* >60 mL/min Final   GFR calc Af Amer 07/15/2019 >60  >60 mL/min Final   Anion gap 07/15/2019 6  5 - 15 Final   Performed at [facility], [place] [address], [place], [place] [address]   WBC 07/15/2019 3.7* 4.0 - 10.5 K/uL Final   RBC 07/15/2019 2.30* 4.22 - 5.81 MIL/uL Final   Hemoglobin 07/15/2019 7.1* 13.0 - 17.0 g/dL Final   HCT 07/15/2019 22.0* 39.0 - 52.0 % Final  MCV 07/15/2019 95.7  80.0 - 100.0 fL Final   MCH 07/15/2019 30.9  26.0 - 34.0 pg Final   MCHC 07/15/2019 32.3  30.0 - 36.0 g/dL Final   RDW 07/15/2019 18.4* 11.5 - 15.5 % Final   Platelets 07/15/2019 35* 150 - 400 K/uL Final   Comment: Immature Platelet Fraction may be clinically indicated, consider ordering this additional test [id number]    nRBC 07/15/2019 2.7* 0.0 - 0.2 % Final   Neutrophils Relative % 07/15/2019 36  % Final   Neutro Abs 07/15/2019 1.3* 1.7 - 7.7 K/uL Final   Lymphocytes Relative 07/15/2019 30  % Final   Lymphs Abs 07/15/2019 1.1  0.7 - 4.0 K/uL Final   Monocytes Relative 07/15/2019 14  % Final   Monocytes Absolute 07/15/2019 0.5  0.1 - 1.0 K/uL Final   Eosinophils Relative 07/15/2019 0  % Final   Eosinophils Absolute 07/15/2019 0.0  0.0 - 0.5 K/uL Final   Basophils Relative 07/15/2019 0  % Final   Basophils Absolute 07/15/2019 0.0  0.0 - 0.1 K/uL Final   Smear Review 07/15/2019 META AND MYELO SEEN   Final   Immature Granulocytes 07/15/2019 20  % Final   Abs Immature Granulocytes 07/15/2019 0.72* 0.00 - 0.07 K/uL Final   Performed at [facility], [place] [address] [place], [place] [address]    (this displays the last labs from the last 3 days)  No results found for: TOTALPROTELP, ALBUMINELP, A1GS, A2GS, BETS,  BETA2SER, GAMS, MSPIKE, SPEI (this displays SPEP labs)  No results found for: KPAFRELGTCHN, LAMBDASER, KAPLAMBRATIO (kappa/lambda light chains)  No results found for: HGBA, HGBA2QUANT, HGBFQUANT, HGBSQUAN (Hemoglobinopathy evaluation)   Lab Results  Component Value Date   LDH 327 (H) 07/08/2019    Lab Results  Component Value Date   IRON 134 03/23/2019   TIBC 326 03/23/2019   IRONPCTSAT 41 (H) 03/23/2019   (Iron and TIBC)  Lab Results  Component Value Date   FERRITIN 2,685 (H) 07/08/2019    Urinalysis    Component Value Date/Time   COLORURINE YELLOW 03/22/2019 [place] 03/22/2019 1609   LABSPEC 1.010 03/22/2019 1609   PHURINE 6.0 03/22/2019 1609   GLUCOSEU NEGATIVE 03/22/2019 [place] 03/22/2019 [place] 03/22/2019 [place] 03/22/2019 1609   PROTEINUR NEGATIVE 03/22/2019 1609   UROBILINOGEN 0.2 08/17/2012 1120   NITRITE NEGATIVE 03/22/2019 [place] 03/22/2019 1609     STUDIES:  No results found.   ELIGIBLE FOR AVAILABLE RESEARCH PROTOCOL: no   ASSESSMENT: 73 y.o. [place] man with a history of end-stage renal disease secondary to hypertension and diabetes, on hemodialysis as of 2010  (a) status post renal transplant at [facility] 03/31/2015  (1) pancytopenia noted 03/22/2019 with fatigue and shortness of breath  (a) smear review confirmed pancytopenia and macrocytosis with hypogranular polys   (b) Bone Marrow Biopsy on 05/02/2019 shows hypercellular marrow with trilineal dysplasia, slightly left shifted myeloid maturation but no increase in blast, mild myelofibrosis  (c) Flow Cytometry shows no increase in CD34 positive blasts and left shifted myeloid maturation   (d) 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   (2)  myelodysplasia, as per #1 above  (a) prognosis: Moderate risk  (3) aranesp started 05/22/2019, repeated weekly  (a) baseline epo level >500, which predicts  a poor response to EPO  (b) ferritin 1060 on 03/23/2019  (4) azacytidine started 07/08/2019, initially repeated daily x5,  then x7 days with subsequent cycles  PLAN:  Erhard tolerated the azacitabine well.  I think we can easily extended to 7 days since he has to come back on Mondays anyway for his retrograde shots.  He understand these treatments are not expected to produce a rapid response.  If he has not responded within 4 months however we will have to consider either adding lenalidomide or other measures.  At that time we may want to refer him for a second opinion at [facility]  We usually do not transfuse him unless his hemoglobin is less than 7.  It likely will be less than 7 later this week so we are setting him up for transfusion this Wednesday  He knows to call for any other issue that may develop before the next visit.   Magrinat, Virgie Dad, MD  07/15/19 3:43 PM Medical Oncology and Hematology [facility] [place], [place] [address] Tel. [phone]    Fax. [phone]  I, Jacqualyn Posey am acting as a Education administrator for Chauncey Cruel, MD.   I, Lurline Del MD, have reviewed the above documentation for accuracy and completeness, and I agree with the above.

## 2019-07-15 ENCOUNTER — Other Ambulatory Visit: Payer: Self-pay

## 2019-07-15 ENCOUNTER — Inpatient Hospital Stay: Payer: Medicare Other

## 2019-07-15 ENCOUNTER — Other Ambulatory Visit: Payer: Self-pay | Admitting: *Deleted

## 2019-07-15 ENCOUNTER — Inpatient Hospital Stay (HOSPITAL_BASED_OUTPATIENT_CLINIC_OR_DEPARTMENT_OTHER): Payer: Medicare Other | Admitting: Oncology

## 2019-07-15 VITALS — BP 157/45 | HR 65 | Temp 98.7°F | Resp 16

## 2019-07-15 DIAGNOSIS — Z940 Kidney transplant status: Secondary | ICD-10-CM

## 2019-07-15 DIAGNOSIS — N186 End stage renal disease: Secondary | ICD-10-CM

## 2019-07-15 DIAGNOSIS — D649 Anemia, unspecified: Secondary | ICD-10-CM

## 2019-07-15 DIAGNOSIS — D61818 Other pancytopenia: Secondary | ICD-10-CM | POA: Diagnosis not present

## 2019-07-15 DIAGNOSIS — E113513 Type 2 diabetes mellitus with proliferative diabetic retinopathy with macular edema, bilateral: Secondary | ICD-10-CM | POA: Diagnosis not present

## 2019-07-15 DIAGNOSIS — D469 Myelodysplastic syndrome, unspecified: Secondary | ICD-10-CM | POA: Diagnosis not present

## 2019-07-15 DIAGNOSIS — D696 Thrombocytopenia, unspecified: Secondary | ICD-10-CM

## 2019-07-15 DIAGNOSIS — Z5111 Encounter for antineoplastic chemotherapy: Secondary | ICD-10-CM | POA: Diagnosis not present

## 2019-07-15 DIAGNOSIS — I158 Other secondary hypertension: Secondary | ICD-10-CM

## 2019-07-15 LAB — CBC WITH DIFFERENTIAL/PLATELET
Abs Immature Granulocytes: 0.72 10*3/uL — ABNORMAL HIGH (ref 0.00–0.07)
Basophils Absolute: 0 10*3/uL (ref 0.0–0.1)
Basophils Relative: 0 %
Eosinophils Absolute: 0 10*3/uL (ref 0.0–0.5)
Eosinophils Relative: 0 %
HCT: 22 % — ABNORMAL LOW (ref 39.0–52.0)
Hemoglobin: 7.1 g/dL — ABNORMAL LOW (ref 13.0–17.0)
Immature Granulocytes: 20 %
Lymphocytes Relative: 30 %
Lymphs Abs: 1.1 10*3/uL (ref 0.7–4.0)
MCH: 30.9 pg (ref 26.0–34.0)
MCHC: 32.3 g/dL (ref 30.0–36.0)
MCV: 95.7 fL (ref 80.0–100.0)
Monocytes Absolute: 0.5 10*3/uL (ref 0.1–1.0)
Monocytes Relative: 14 %
Neutro Abs: 1.3 10*3/uL — ABNORMAL LOW (ref 1.7–7.7)
Neutrophils Relative %: 36 %
Platelets: 35 10*3/uL — ABNORMAL LOW (ref 150–400)
RBC: 2.3 MIL/uL — ABNORMAL LOW (ref 4.22–5.81)
RDW: 18.4 % — ABNORMAL HIGH (ref 11.5–15.5)
WBC: 3.7 10*3/uL — ABNORMAL LOW (ref 4.0–10.5)
nRBC: 2.7 % — ABNORMAL HIGH (ref 0.0–0.2)

## 2019-07-15 LAB — COMPREHENSIVE METABOLIC PANEL
ALT: 7 U/L (ref 0–44)
AST: 11 U/L — ABNORMAL LOW (ref 15–41)
Albumin: 3.8 g/dL (ref 3.5–5.0)
Alkaline Phosphatase: 89 U/L (ref 38–126)
Anion gap: 6 (ref 5–15)
BUN: 17 mg/dL (ref 8–23)
CO2: 28 mmol/L (ref 22–32)
Calcium: 10.2 mg/dL (ref 8.9–10.3)
Chloride: 106 mmol/L (ref 98–111)
Creatinine, Ser: 1.36 mg/dL — ABNORMAL HIGH (ref 0.61–1.24)
GFR calc Af Amer: >60 mL/min (ref >60)
GFR calc non Af Amer: 52 mL/min — ABNORMAL LOW (ref >60)
Glucose, Bld: 151 mg/dL — ABNORMAL HIGH (ref 70–99)
Potassium: 3.9 mmol/L (ref 3.5–5.1)
Sodium: 140 mmol/L (ref 135–145)
Total Bilirubin: 1.2 mg/dL (ref 0.3–1.2)
Total Protein: 6.8 g/dL (ref 6.5–8.1)

## 2019-07-15 MED ORDER — ONDANSETRON HCL 8 MG PO TABS
8.0000 mg | ORAL_TABLET | Freq: Once | ORAL | Status: AC
  Administered 2019-07-15: 8 mg via ORAL

## 2019-07-15 MED ORDER — ONDANSETRON HCL 8 MG PO TABS
ORAL_TABLET | ORAL | Status: AC
  Filled 2019-07-15: qty 1

## 2019-07-15 MED ORDER — EPOETIN ALFA-EPBX 40000 UNIT/ML IJ SOLN
40000.0000 [IU] | Freq: Once | INTRAMUSCULAR | Status: AC
  Administered 2019-07-15: 15:00:00 40000 [IU] via SUBCUTANEOUS
  Filled 2019-07-15: qty 1

## 2019-07-15 MED ORDER — AZACITIDINE CHEMO SQ INJECTION
75.0000 mg/m2 | Freq: Once | INTRAMUSCULAR | Status: AC
  Administered 2019-07-15: 165 mg via SUBCUTANEOUS
  Filled 2019-07-15: qty 6.6

## 2019-07-15 NOTE — Patient Instructions (Signed)
[facility] Discharge Instructions for Patients Receiving Chemotherapy  Today you received the following chemotherapy agents: Vidaza.  To help prevent nausea and vomiting after your treatment, we encourage you to take your nausea medication as directed.   If you develop nausea and vomiting that is not controlled by your nausea medication, call the clinic.   BELOW ARE SYMPTOMS THAT SHOULD BE REPORTED IMMEDIATELY:  *FEVER GREATER THAN 100.5 F  *CHILLS WITH OR WITHOUT FEVER  NAUSEA AND VOMITING THAT IS NOT CONTROLLED WITH YOUR NAUSEA MEDICATION  *UNUSUAL SHORTNESS OF BREATH  *UNUSUAL BRUISING OR BLEEDING  TENDERNESS IN MOUTH AND THROAT WITH OR WITHOUT PRESENCE OF ULCERS  *URINARY PROBLEMS  *BOWEL PROBLEMS  UNUSUAL RASH Items with * indicate a potential emergency and should be followed up as soon as possible.  Feel free to call the clinic should you have any questions or concerns. The clinic phone number is [phone].  Please show the [facility] at check-in to the Emergency Department and triage nurse.  Epoetin Alfa injection What is this medicine? EPOETIN ALFA (e POE e tin AL fa) helps your body make more red blood cells. This medicine is used to treat anemia caused by chronic kidney disease, cancer chemotherapy, or HIV-therapy. It may also be used before surgery if you have anemia. This medicine may be used for other purposes; ask your health care provider or pharmacist if you have questions. COMMON BRAND NAME(S): Epogen, Procrit, Retacrit What should I tell my health care provider before I take this medicine? They need to know if you have any of these conditions:  cancer  heart disease  high blood pressure  history of blood clots  history of stroke  low levels of folate, iron, or vitamin B12 in the blood  seizures  an unusual or allergic reaction to erythropoietin, albumin, benzyl alcohol, hamster proteins, other medicines, foods, dyes,  or preservatives  pregnant or trying to get pregnant  breast-feeding How should I use this medicine? This medicine is for injection into a vein or under the skin. It is usually given by a health care professional in a hospital or clinic setting. If you get this medicine at home, you will be taught how to prepare and give this medicine. Use exactly as directed. Take your medicine at regular intervals. Do not take your medicine more often than directed. It is important that you put your used needles and syringes in a special sharps container. Do not put them in a trash can. If you do not have a sharps container, call your pharmacist or healthcare provider to get one. A special MedGuide will be given to you by the pharmacist with each prescription and refill. Be sure to read this information carefully each time. Talk to your pediatrician regarding the use of this medicine in children. While this drug may be prescribed for selected conditions, precautions do apply. Overdosage: If you think you have taken too much of this medicine contact a poison control center or emergency room at once. NOTE: This medicine is only for you. Do not share this medicine with others. What if I miss a dose? If you miss a dose, take it as soon as you can. If it is almost time for your next dose, take only that dose. Do not take double or extra doses. What may interact with this medicine? Interactions have not been studied. This list may not describe all possible interactions. Give your health care provider a list of all the  medicines, herbs, non-prescription drugs, or dietary supplements you use. Also tell them if you smoke, drink alcohol, or use illegal drugs. Some items may interact with your medicine. What should I watch for while using this medicine? Your condition will be monitored carefully while you are receiving this medicine. You may need blood work done while you are taking this medicine. This medicine may cause  a decrease in vitamin B6. You should make sure that you get enough vitamin B6 while you are taking this medicine. Discuss the foods you eat and the vitamins you take with your health care professional. What side effects may I notice from receiving this medicine? Side effects that you should report to your doctor or health care professional as soon as possible:  allergic reactions like skin rash, itching or hives, swelling of the face, lips, or tongue  seizures  signs and symptoms of a blood clot such as breathing problems; changes in vision; chest pain; severe, sudden headache; pain, swelling, warmth in the leg; trouble speaking; sudden numbness or weakness of the face, arm or leg  signs and symptoms of a stroke like changes in vision; confusion; trouble speaking or understanding; severe headaches; sudden numbness or weakness of the face, arm or leg; trouble walking; dizziness; loss of balance or coordination Side effects that usually do not require medical attention (report to your doctor or health care professional if they continue or are bothersome):  chills  cough  dizziness  fever  headaches  joint pain  muscle cramps  muscle pain  nausea, vomiting  pain, redness, or irritation at site where injected This list may not describe all possible side effects. Call your doctor for medical advice about side effects. You may report side effects to FDA at 1-800-FDA-1088. Where should I keep my medicine? Keep out of the reach of children. Store in a refrigerator between 2 and 8 degrees C (36 and 46 degrees F). Do not freeze or shake. Throw away any unused portion if using a single-dose vial. Multi-dose vials can be kept in the refrigerator for up to 21 days after the initial dose. Throw away unused medicine. NOTE: This sheet is a summary. It may not cover all possible information. If you have questions about this medicine, talk to your doctor, pharmacist, or health care provider.   2020 Elsevier/Gold Standard (2017-05-12 08:35:19)

## 2019-07-15 NOTE — Progress Notes (Signed)
Per Dr. Jana Hakim, ok to treat with current labs.

## 2019-07-16 ENCOUNTER — Telehealth: Payer: Self-pay | Admitting: Oncology

## 2019-07-16 NOTE — Telephone Encounter (Signed)
I left a message regarding adjustment for 10/19 and to request print out if needed when he arrives 9/30

## 2019-07-16 NOTE — Telephone Encounter (Signed)
Scheduled appt per 9/28 sch message - unable to reach pt . Left message with appt date and time

## 2019-07-17 ENCOUNTER — Inpatient Hospital Stay: Payer: Medicare Other

## 2019-07-17 ENCOUNTER — Other Ambulatory Visit: Payer: Medicare Other

## 2019-07-17 ENCOUNTER — Telehealth: Payer: Self-pay | Admitting: Emergency Medicine

## 2019-07-17 ENCOUNTER — Other Ambulatory Visit: Payer: Self-pay

## 2019-07-17 DIAGNOSIS — Z5111 Encounter for antineoplastic chemotherapy: Secondary | ICD-10-CM | POA: Diagnosis not present

## 2019-07-17 DIAGNOSIS — D61818 Other pancytopenia: Secondary | ICD-10-CM

## 2019-07-17 DIAGNOSIS — D649 Anemia, unspecified: Secondary | ICD-10-CM

## 2019-07-17 LAB — CBC WITH DIFFERENTIAL/PLATELET
Abs Immature Granulocytes: 0.48 10*3/uL — ABNORMAL HIGH (ref 0.00–0.07)
Basophils Absolute: 0 10*3/uL (ref 0.0–0.1)
Basophils Relative: 0 %
Eosinophils Absolute: 0 10*3/uL (ref 0.0–0.5)
Eosinophils Relative: 0 %
HCT: 21.4 % — ABNORMAL LOW (ref 39.0–52.0)
Hemoglobin: 7 g/dL — ABNORMAL LOW (ref 13.0–17.0)
Immature Granulocytes: 18 %
Lymphocytes Relative: 40 %
Lymphs Abs: 1.1 10*3/uL (ref 0.7–4.0)
MCH: 31.3 pg (ref 26.0–34.0)
MCHC: 32.7 g/dL (ref 30.0–36.0)
MCV: 95.5 fL (ref 80.0–100.0)
Monocytes Absolute: 0.3 10*3/uL (ref 0.1–1.0)
Monocytes Relative: 12 %
Neutro Abs: 0.8 10*3/uL — ABNORMAL LOW (ref 1.7–7.7)
Neutrophils Relative %: 30 %
Platelets: 35 10*3/uL — ABNORMAL LOW (ref 150–400)
RBC: 2.24 MIL/uL — ABNORMAL LOW (ref 4.22–5.81)
RDW: 19 % — ABNORMAL HIGH (ref 11.5–15.5)
WBC: 2.7 10*3/uL — ABNORMAL LOW (ref 4.0–10.5)
nRBC: 2.6 % — ABNORMAL HIGH (ref 0.0–0.2)

## 2019-07-17 LAB — PREPARE RBC (CROSSMATCH)

## 2019-07-17 MED ORDER — DIPHENHYDRAMINE HCL 25 MG PO CAPS
ORAL_CAPSULE | ORAL | Status: AC
  Filled 2019-07-17: qty 1

## 2019-07-17 MED ORDER — DIPHENHYDRAMINE HCL 25 MG PO CAPS
25.0000 mg | ORAL_CAPSULE | Freq: Once | ORAL | Status: AC
  Administered 2019-07-17: 25 mg via ORAL

## 2019-07-17 MED ORDER — SODIUM CHLORIDE 0.9% IV SOLUTION
250.0000 mL | Freq: Once | INTRAVENOUS | Status: AC
  Administered 2019-07-17: 250 mL via INTRAVENOUS
  Filled 2019-07-17: qty 250

## 2019-07-17 MED ORDER — HEPARIN SOD (PORK) LOCK FLUSH 100 UNIT/ML IV SOLN
500.0000 [IU] | Freq: Every day | INTRAVENOUS | Status: DC | PRN
  Filled 2019-07-17: qty 5

## 2019-07-17 MED ORDER — SODIUM CHLORIDE 0.9% FLUSH
10.0000 mL | INTRAVENOUS | Status: DC | PRN
  Filled 2019-07-17: qty 10

## 2019-07-17 MED ORDER — ACETAMINOPHEN 325 MG PO TABS
650.0000 mg | ORAL_TABLET | Freq: Once | ORAL | Status: AC
  Administered 2019-07-17: 650 mg via ORAL

## 2019-07-17 MED ORDER — ACETAMINOPHEN 325 MG PO TABS
ORAL_TABLET | ORAL | Status: AC
  Filled 2019-07-17: qty 2

## 2019-07-17 NOTE — Telephone Encounter (Signed)
Called pt to find out about missed [facility] appt for blood transfusion this am.  Pt states he never received VM about today's appts but would like to come in for 1 unit blood as originally scheduled.  High priority scheduling message sent for lab at 10 am and [facility] appt for 1 unit blood at 1030, pt verbalized understanding.

## 2019-07-17 NOTE — Patient Instructions (Signed)
Blood Transfusion, Adult, Care After This sheet gives you information about how to care for yourself after your procedure. Your doctor may also give you more specific instructions. If you have problems or questions, contact your doctor. Follow these instructions at home:   Take over-the-counter and prescription medicines only as told by your doctor.  Go back to your normal activities as told by your doctor.  Follow instructions from your doctor about how to take care of the area where an IV tube was put into your vein (insertion site). Make sure you: ? Wash your hands with soap and water before you change your bandage (dressing). If there is no soap and water, use hand sanitizer. ? Change your bandage as told by your doctor.  Check your IV insertion site every day for signs of infection. Check for: ? More redness, swelling, or pain. ? More fluid or blood. ? Warmth. ? Pus or a bad smell. Contact a doctor if:  You have more redness, swelling, or pain around the IV insertion site.  You have more fluid or blood coming from the IV insertion site.  Your IV insertion site feels warm to the touch.  You have pus or a bad smell coming from the IV insertion site.  Your pee (urine) turns pink, red, or brown.  You feel weak after doing your normal activities. Get help right away if:  You have signs of a serious allergic or body defense (immune) system reaction, including: ? Itchiness. ? Hives. ? Trouble breathing. ? Anxiety. ? Pain in your chest or lower back. ? Fever, flushing, and chills. ? Fast pulse. ? Rash. ? Watery poop (diarrhea). ? Throwing up (vomiting). ? Dark pee. ? Serious headache. ? Dizziness. ? Stiff neck. ? Yellow color in your face or the white parts of your eyes (jaundice). Summary  After a blood transfusion, return to your normal activities as told by your doctor.  Every day, check for signs of infection where the IV tube was put into your vein.  Some  signs of infection are warm skin, more redness and pain, more fluid or blood, and pus or a bad smell where the needle went in.  Contact your doctor if you feel weak or have any unusual symptoms. This information is not intended to replace advice given to you by your health care provider. Make sure you discuss any questions you have with your health care provider. Document Released: 10/24/2014 Document Revised: 02/07/2018 Document Reviewed: 05/27/2016 Elsevier Patient Education  2020 [employer]  

## 2019-07-17 NOTE — Progress Notes (Signed)
Pt received 1 unit PRBCs today, tolerated well.  Denies needing food/drink during infusion.  VSS.  Verbalized understanding of d/c and f/u instructions.

## 2019-07-18 LAB — TYPE AND SCREEN
ABO/RH(D): A POS
Antibody Screen: NEGATIVE
Unit division: 0

## 2019-07-18 LAB — BPAM RBC
Blood Product Expiration Date: 202010212359
ISSUE DATE / TIME: 202009301120
Unit Type and Rh: 6200

## 2019-07-22 ENCOUNTER — Inpatient Hospital Stay: Payer: Medicare Other

## 2019-07-22 ENCOUNTER — Inpatient Hospital Stay: Payer: Medicare Other | Attending: Oncology

## 2019-07-22 ENCOUNTER — Other Ambulatory Visit: Payer: Self-pay

## 2019-07-22 VITALS — BP 148/62 | HR 62 | Temp 98.2°F | Resp 18

## 2019-07-22 DIAGNOSIS — Z992 Dependence on renal dialysis: Secondary | ICD-10-CM | POA: Insufficient documentation

## 2019-07-22 DIAGNOSIS — D61818 Other pancytopenia: Secondary | ICD-10-CM

## 2019-07-22 DIAGNOSIS — Z5111 Encounter for antineoplastic chemotherapy: Secondary | ICD-10-CM | POA: Insufficient documentation

## 2019-07-22 DIAGNOSIS — D469 Myelodysplastic syndrome, unspecified: Secondary | ICD-10-CM | POA: Diagnosis present

## 2019-07-22 DIAGNOSIS — Z940 Kidney transplant status: Secondary | ICD-10-CM | POA: Insufficient documentation

## 2019-07-22 DIAGNOSIS — N186 End stage renal disease: Secondary | ICD-10-CM | POA: Insufficient documentation

## 2019-07-22 DIAGNOSIS — Z79899 Other long term (current) drug therapy: Secondary | ICD-10-CM | POA: Insufficient documentation

## 2019-07-22 DIAGNOSIS — D649 Anemia, unspecified: Secondary | ICD-10-CM

## 2019-07-22 DIAGNOSIS — Z794 Long term (current) use of insulin: Secondary | ICD-10-CM | POA: Insufficient documentation

## 2019-07-22 DIAGNOSIS — I120 Hypertensive chronic kidney disease with stage 5 chronic kidney disease or end stage renal disease: Secondary | ICD-10-CM | POA: Diagnosis not present

## 2019-07-22 DIAGNOSIS — E1122 Type 2 diabetes mellitus with diabetic chronic kidney disease: Secondary | ICD-10-CM | POA: Insufficient documentation

## 2019-07-22 DIAGNOSIS — D696 Thrombocytopenia, unspecified: Secondary | ICD-10-CM

## 2019-07-22 LAB — RETICULOCYTES
Immature Retic Fract: 25 % — ABNORMAL HIGH (ref 2.3–15.9)
RBC.: 2.32 MIL/uL — ABNORMAL LOW (ref 4.22–5.81)
Retic Count, Absolute: 77.5 10*3/uL (ref 19.0–186.0)
Retic Ct Pct: 3.3 % — ABNORMAL HIGH (ref 0.4–3.1)

## 2019-07-22 LAB — CBC WITH DIFFERENTIAL/PLATELET
Abs Immature Granulocytes: 0.58 10*3/uL — ABNORMAL HIGH (ref 0.00–0.07)
Basophils Absolute: 0 10*3/uL (ref 0.0–0.1)
Basophils Relative: 1 %
Eosinophils Absolute: 0 10*3/uL (ref 0.0–0.5)
Eosinophils Relative: 0 %
HCT: 21.8 % — ABNORMAL LOW (ref 39.0–52.0)
Hemoglobin: 7.1 g/dL — ABNORMAL LOW (ref 13.0–17.0)
Immature Granulocytes: 18 %
Lymphocytes Relative: 29 %
Lymphs Abs: 0.9 10*3/uL (ref 0.7–4.0)
MCH: 30.9 pg (ref 26.0–34.0)
MCHC: 32.6 g/dL (ref 30.0–36.0)
MCV: 94.8 fL (ref 80.0–100.0)
Monocytes Absolute: 0.3 10*3/uL (ref 0.1–1.0)
Monocytes Relative: 10 %
Neutro Abs: 1.3 10*3/uL — ABNORMAL LOW (ref 1.7–7.7)
Neutrophils Relative %: 42 %
Platelets: 40 10*3/uL — ABNORMAL LOW (ref 150–400)
RBC: 2.3 MIL/uL — ABNORMAL LOW (ref 4.22–5.81)
RDW: 19.9 % — ABNORMAL HIGH (ref 11.5–15.5)
WBC: 3.2 10*3/uL — ABNORMAL LOW (ref 4.0–10.5)
nRBC: 4.4 % — ABNORMAL HIGH (ref 0.0–0.2)

## 2019-07-22 LAB — LACTATE DEHYDROGENASE: LDH: 231 U/L — ABNORMAL HIGH (ref 98–192)

## 2019-07-22 LAB — FERRITIN: Ferritin: 2250 ng/mL — ABNORMAL HIGH (ref 24–336)

## 2019-07-22 MED ORDER — EPOETIN ALFA-EPBX 40000 UNIT/ML IJ SOLN
40000.0000 [IU] | Freq: Once | INTRAMUSCULAR | Status: AC
  Administered 2019-07-22: 40000 [IU] via SUBCUTANEOUS
  Filled 2019-07-22: qty 1

## 2019-07-22 NOTE — Progress Notes (Signed)
Per Dr. Jana Hakim, patient is to continue retacrit while on azacitadine.   Demetrius Charity, PharmD, [facility] Pharmacist Pharmacy Phone: [phone] 07/22/2019

## 2019-07-22 NOTE — Patient Instructions (Signed)

## 2019-07-29 ENCOUNTER — Inpatient Hospital Stay: Payer: Medicare Other

## 2019-07-29 ENCOUNTER — Other Ambulatory Visit: Payer: Self-pay

## 2019-07-29 VITALS — BP 148/60 | HR 62 | Temp 98.2°F | Resp 18

## 2019-07-29 DIAGNOSIS — Z940 Kidney transplant status: Secondary | ICD-10-CM

## 2019-07-29 DIAGNOSIS — D649 Anemia, unspecified: Secondary | ICD-10-CM

## 2019-07-29 DIAGNOSIS — Z5111 Encounter for antineoplastic chemotherapy: Secondary | ICD-10-CM | POA: Diagnosis not present

## 2019-07-29 DIAGNOSIS — D61818 Other pancytopenia: Secondary | ICD-10-CM

## 2019-07-29 DIAGNOSIS — D469 Myelodysplastic syndrome, unspecified: Secondary | ICD-10-CM

## 2019-07-29 LAB — CBC WITH DIFFERENTIAL/PLATELET
Abs Immature Granulocytes: 0.86 10*3/uL — ABNORMAL HIGH (ref 0.00–0.07)
Basophils Absolute: 0.1 10*3/uL (ref 0.0–0.1)
Basophils Relative: 2 %
Eosinophils Absolute: 0 10*3/uL (ref 0.0–0.5)
Eosinophils Relative: 0 %
HCT: 23.3 % — ABNORMAL LOW (ref 39.0–52.0)
Hemoglobin: 7.4 g/dL — ABNORMAL LOW (ref 13.0–17.0)
Immature Granulocytes: 20 %
Lymphocytes Relative: 21 %
Lymphs Abs: 0.9 10*3/uL (ref 0.7–4.0)
MCH: 31.8 pg (ref 26.0–34.0)
MCHC: 31.8 g/dL (ref 30.0–36.0)
MCV: 100 fL (ref 80.0–100.0)
Monocytes Absolute: 0.8 10*3/uL (ref 0.1–1.0)
Monocytes Relative: 17 %
Neutro Abs: 1.8 10*3/uL (ref 1.7–7.7)
Neutrophils Relative %: 40 %
Platelets: 46 10*3/uL — ABNORMAL LOW (ref 150–400)
RBC: 2.33 MIL/uL — ABNORMAL LOW (ref 4.22–5.81)
RDW: 25.4 % — ABNORMAL HIGH (ref 11.5–15.5)
WBC: 4.4 10*3/uL (ref 4.0–10.5)
nRBC: 9 % — ABNORMAL HIGH (ref 0.0–0.2)

## 2019-07-29 MED ORDER — EPOETIN ALFA-EPBX 40000 UNIT/ML IJ SOLN
40000.0000 [IU] | Freq: Once | INTRAMUSCULAR | Status: AC
  Administered 2019-07-29: 40000 [IU] via SUBCUTANEOUS
  Filled 2019-07-29: qty 1

## 2019-08-05 ENCOUNTER — Inpatient Hospital Stay: Payer: Medicare Other

## 2019-08-05 ENCOUNTER — Ambulatory Visit: Payer: Medicare Other

## 2019-08-05 ENCOUNTER — Other Ambulatory Visit: Payer: Self-pay

## 2019-08-05 ENCOUNTER — Inpatient Hospital Stay (HOSPITAL_BASED_OUTPATIENT_CLINIC_OR_DEPARTMENT_OTHER): Payer: Medicare Other | Admitting: Adult Health

## 2019-08-05 ENCOUNTER — Encounter: Payer: Self-pay | Admitting: Adult Health

## 2019-08-05 ENCOUNTER — Other Ambulatory Visit: Payer: Medicare Other

## 2019-08-05 ENCOUNTER — Other Ambulatory Visit: Payer: Self-pay | Admitting: Oncology

## 2019-08-05 VITALS — BP 159/65 | HR 58 | Temp 98.9°F | Resp 17 | Ht 73.0 in | Wt 201.4 lb

## 2019-08-05 DIAGNOSIS — D469 Myelodysplastic syndrome, unspecified: Secondary | ICD-10-CM | POA: Diagnosis not present

## 2019-08-05 DIAGNOSIS — D696 Thrombocytopenia, unspecified: Secondary | ICD-10-CM

## 2019-08-05 DIAGNOSIS — Z940 Kidney transplant status: Secondary | ICD-10-CM

## 2019-08-05 DIAGNOSIS — D649 Anemia, unspecified: Secondary | ICD-10-CM

## 2019-08-05 DIAGNOSIS — Z5111 Encounter for antineoplastic chemotherapy: Secondary | ICD-10-CM | POA: Diagnosis not present

## 2019-08-05 DIAGNOSIS — D61818 Other pancytopenia: Secondary | ICD-10-CM

## 2019-08-05 LAB — RETICULOCYTES
Immature Retic Fract: 14.9 % (ref 2.3–15.9)
RBC.: 2.2 MIL/uL — ABNORMAL LOW (ref 4.22–5.81)
Retic Count, Absolute: 252.5 10*3/uL — ABNORMAL HIGH (ref 19.0–186.0)
Retic Ct Pct: 11.5 % — ABNORMAL HIGH (ref 0.4–3.1)

## 2019-08-05 LAB — CBC WITH DIFFERENTIAL/PLATELET
Abs Immature Granulocytes: 1.47 10*3/uL — ABNORMAL HIGH (ref 0.00–0.07)
Basophils Absolute: 0 10*3/uL (ref 0.0–0.1)
Basophils Relative: 1 %
Eosinophils Absolute: 0 10*3/uL (ref 0.0–0.5)
Eosinophils Relative: 0 %
HCT: 24.7 % — ABNORMAL LOW (ref 39.0–52.0)
Hemoglobin: 7.8 g/dL — ABNORMAL LOW (ref 13.0–17.0)
Immature Granulocytes: 28 %
Lymphocytes Relative: 21 %
Lymphs Abs: 1.1 10*3/uL (ref 0.7–4.0)
MCH: 32.8 pg (ref 26.0–34.0)
MCHC: 31.6 g/dL (ref 30.0–36.0)
MCV: 103.8 fL — ABNORMAL HIGH (ref 80.0–100.0)
Monocytes Absolute: 0.8 10*3/uL (ref 0.1–1.0)
Monocytes Relative: 14 %
Neutro Abs: 1.9 10*3/uL (ref 1.7–7.7)
Neutrophils Relative %: 36 %
Platelets: 52 10*3/uL — ABNORMAL LOW (ref 150–400)
RBC: 2.38 MIL/uL — ABNORMAL LOW (ref 4.22–5.81)
RDW: 27.7 % — ABNORMAL HIGH (ref 11.5–15.5)
WBC: 5.3 10*3/uL (ref 4.0–10.5)
nRBC: 4.4 % — ABNORMAL HIGH (ref 0.0–0.2)

## 2019-08-05 LAB — FERRITIN: Ferritin: 1463 ng/mL — ABNORMAL HIGH (ref 24–336)

## 2019-08-05 LAB — LACTATE DEHYDROGENASE: LDH: 357 U/L — ABNORMAL HIGH (ref 98–192)

## 2019-08-05 MED ORDER — EPOETIN ALFA-EPBX 40000 UNIT/ML IJ SOLN
40000.0000 [IU] | Freq: Once | INTRAMUSCULAR | Status: AC
  Administered 2019-08-05: 14:00:00 40000 [IU] via SUBCUTANEOUS
  Filled 2019-08-05: qty 1

## 2019-08-05 MED ORDER — EPOETIN ALFA 40000 UNIT/ML IJ SOLN
INTRAMUSCULAR | Status: AC
  Filled 2019-08-05: qty 1

## 2019-08-05 MED ORDER — AZACITIDINE CHEMO SQ INJECTION
75.0000 mg/m2 | Freq: Once | INTRAMUSCULAR | Status: AC
  Administered 2019-08-05: 14:00:00 165 mg via SUBCUTANEOUS
  Filled 2019-08-05: qty 6.6

## 2019-08-05 MED ORDER — ONDANSETRON HCL 8 MG PO TABS
ORAL_TABLET | ORAL | Status: AC
  Filled 2019-08-05: qty 1

## 2019-08-05 MED ORDER — ONDANSETRON HCL 8 MG PO TABS
8.0000 mg | ORAL_TABLET | Freq: Once | ORAL | Status: AC
  Administered 2019-08-05: 8 mg via ORAL

## 2019-08-05 NOTE — Patient Instructions (Addendum)
[facility] Discharge Instructions for Patients Receiving Chemotherapy  Today you received the following chemotherapy agents: Azacitidine (VIDAZA).  To help prevent nausea and vomiting after your treatment, we encourage you to take your nausea medication as directed.   If you develop nausea and vomiting that is not controlled by your nausea medication, call the clinic.   BELOW ARE SYMPTOMS THAT SHOULD BE REPORTED IMMEDIATELY:  *FEVER GREATER THAN 100.5 F  *CHILLS WITH OR WITHOUT FEVER  NAUSEA AND VOMITING THAT IS NOT CONTROLLED WITH YOUR NAUSEA MEDICATION  *UNUSUAL SHORTNESS OF BREATH  *UNUSUAL BRUISING OR BLEEDING  TENDERNESS IN MOUTH AND THROAT WITH OR WITHOUT PRESENCE OF ULCERS  *URINARY PROBLEMS  *BOWEL PROBLEMS  UNUSUAL RASH Items with * indicate a potential emergency and should be followed up as soon as possible.  Feel free to call the clinic should you have any questions or concerns. The clinic phone number is [phone].  Please show the [facility] at check-in to the Emergency Department and triage nurse.  Epoetin Alfa injection What is this medicine? EPOETIN ALFA (e POE e tin AL fa) helps your body make more red blood cells. This medicine is used to treat anemia caused by chronic kidney disease, cancer chemotherapy, or HIV-therapy. It may also be used before surgery if you have anemia. This medicine may be used for other purposes; ask your health care provider or pharmacist if you have questions. COMMON BRAND NAME(S): Epogen, Procrit, Retacrit What should I tell my health care provider before I take this medicine? They need to know if you have any of these conditions:  cancer  heart disease  high blood pressure  history of blood clots  history of stroke  low levels of folate, iron, or vitamin B12 in the blood  seizures  an unusual or allergic reaction to erythropoietin, albumin, benzyl alcohol, hamster proteins, other medicines,  foods, dyes, or preservatives  pregnant or trying to get pregnant  breast-feeding How should I use this medicine? This medicine is for injection into a vein or under the skin. It is usually given by a health care professional in a hospital or clinic setting. If you get this medicine at home, you will be taught how to prepare and give this medicine. Use exactly as directed. Take your medicine at regular intervals. Do not take your medicine more often than directed. It is important that you put your used needles and syringes in a special sharps container. Do not put them in a trash can. If you do not have a sharps container, call your pharmacist or healthcare provider to get one. A special MedGuide will be given to you by the pharmacist with each prescription and refill. Be sure to read this information carefully each time. Talk to your pediatrician regarding the use of this medicine in children. While this drug may be prescribed for selected conditions, precautions do apply. Overdosage: If you think you have taken too much of this medicine contact a poison control center or emergency room at once. NOTE: This medicine is only for you. Do not share this medicine with others. What if I miss a dose? If you miss a dose, take it as soon as you can. If it is almost time for your next dose, take only that dose. Do not take double or extra doses. What may interact with this medicine? Interactions have not been studied. This list may not describe all possible interactions. Give your health care provider a list of all  the medicines, herbs, non-prescription drugs, or dietary supplements you use. Also tell them if you smoke, drink alcohol, or use illegal drugs. Some items may interact with your medicine. What should I watch for while using this medicine? Your condition will be monitored carefully while you are receiving this medicine. You may need blood work done while you are taking this medicine. This  medicine may cause a decrease in vitamin B6. You should make sure that you get enough vitamin B6 while you are taking this medicine. Discuss the foods you eat and the vitamins you take with your health care professional. What side effects may I notice from receiving this medicine? Side effects that you should report to your doctor or health care professional as soon as possible:  allergic reactions like skin rash, itching or hives, swelling of the face, lips, or tongue  seizures  signs and symptoms of a blood clot such as breathing problems; changes in vision; chest pain; severe, sudden headache; pain, swelling, warmth in the leg; trouble speaking; sudden numbness or weakness of the face, arm or leg  signs and symptoms of a stroke like changes in vision; confusion; trouble speaking or understanding; severe headaches; sudden numbness or weakness of the face, arm or leg; trouble walking; dizziness; loss of balance or coordination Side effects that usually do not require medical attention (report to your doctor or health care professional if they continue or are bothersome):  chills  cough  dizziness  fever  headaches  joint pain  muscle cramps  muscle pain  nausea, vomiting  pain, redness, or irritation at site where injected This list may not describe all possible side effects. Call your doctor for medical advice about side effects. You may report side effects to FDA at 1-800-FDA-1088. Where should I keep my medicine? Keep out of the reach of children. Store in a refrigerator between 2 and 8 degrees C (36 and 46 degrees F). Do not freeze or shake. Throw away any unused portion if using a single-dose vial. Multi-dose vials can be kept in the refrigerator for up to 21 days after the initial dose. Throw away unused medicine. NOTE: This sheet is a summary. It may not cover all possible information. If you have questions about this medicine, talk to your doctor, pharmacist, or health  care provider.  2020 Elsevier/Gold Standard (2017-05-12 08:35:19)  Coronavirus (COVID-19) Are you at risk?  Are you at risk for the Coronavirus (COVID-19)?  To be considered HIGH RISK for Coronavirus (COVID-19), you have to meet the following criteria:  . Traveled to [place], [place], [place], [place] or [place]; or in the [place] to [place], [place], [place], or [place]; and have fever, cough, and shortness of breath within the last 2 weeks of travel OR . Been in close contact with a person diagnosed with COVID-19 within the last 2 weeks and have fever, cough, and shortness of breath . IF YOU DO NOT MEET THESE CRITERIA, YOU ARE CONSIDERED LOW RISK FOR COVID-19.  What to do if you are HIGH RISK for COVID-19?  [name] If you are having a medical emergency, call 911. . Seek medical care right away. Before you go to a doctor's office, urgent care or emergency department, call ahead and tell them about your recent travel, contact with someone diagnosed with COVID-19, and your symptoms. You should receive instructions from your physician's office regarding next steps of care.  . When you arrive at healthcare provider, tell the healthcare staff immediately you  have returned from visiting [place], [place], [place], [place] or [place]; or traveled in the [place] to [place], [place], [place], or [place]; in the last two weeks or you have been in close contact with a person diagnosed with COVID-19 in the last 2 weeks.   . Tell the health care staff about your symptoms: fever, cough and shortness of breath. . After you have been seen by a medical provider, you will be either: o Tested for (COVID-19) and discharged home on quarantine except to seek medical care if symptoms worsen, and asked to  - Stay home and avoid contact with others until you get your results (4-5 days)  - Avoid travel on public transportation if possible (such as bus, train, or airplane) or o Sent to the Emergency  Department by EMS for evaluation, COVID-19 testing, and possible admission depending on your condition and test results.  What to do if you are LOW RISK for COVID-19?  Reduce your risk of any infection by using the same precautions used for avoiding the common cold or flu:  [name] Wash your hands often with soap and warm water for at least 20 seconds.  If soap and water are not readily available, use an alcohol-based hand sanitizer with at least 60% alcohol.  . If coughing or sneezing, cover your mouth and nose by coughing or sneezing into the elbow areas of your shirt or coat, into a tissue or into your sleeve (not your hands). . Avoid shaking hands with others and consider head nods or verbal greetings only. . Avoid touching your eyes, nose, or mouth with unwashed hands.  . Avoid close contact with people who are sick. . Avoid places or events with large numbers of people in one location, like concerts or sporting events. . Carefully consider travel plans you have or are making. . If you are planning any travel outside or inside the [place], visit the CDC's Travelers' Health webpage for the latest health notices. . If you have some symptoms but not all symptoms, continue to monitor at home and seek medical attention if your symptoms worsen. . If you are having a medical emergency, call 911.   [place] / e-Visit: [email or web address]         [facility]: [facility]: [phone]                   [facility]: [phone]

## 2019-08-05 NOTE — Progress Notes (Signed)
[place]  [phone] [phone]    ID: [name] DOB: 1946-10-16  MR#: [id number]  UPJ#:[id number]  Patient Care Team: Rogers Blocker, MD as PCP - General (Internal Medicine) Magrinat, Virgie Dad, MD as Consulting Physician (Oncology) Donato Heinz, MD as Consulting Physician (Nephrology) Ander Slade, Carlisle Beers, MD as Referring Physician (Ophthalmology) [name] Erskine Squibb, MD as Referring Physician (Vascular Surgery) OTHER MD:  CHIEF COMPLAINT: MDS  CURRENT TREATMENT: Retacrit, azacytidine   INTERVAL HISTORY: [name] returns today for follow-up and treatment of MDS.   He continues on Retacrit.   He also continues on azacytidine.  The first cycle he was only scheduled for 5 doses.  This week he will receive 5, and then 2 on the following week.  He is tolerating this well.    REVIEW OF SYSTEMS: [name] tells me he is feeling well.  He Savage that his fatigue is improving and he is feeling better today than he has previously.  He has lost 13 pounds since 06/19/2019.  He Savage that he was previously exercising before COVID and now he isn't because he cannot go to the gym.  He feels like he has lost muscle mass.  He also Savage he has a decreased appetite.  He is eating plenty of fruits, but has limited vegetables and meats that he enjoys.    [name] is otherwise doing well.  He has no headaches, vision issues, fever, chills, mucositis, cough, shortness of breath, chest pain, palpitations, bowel/bladder changes, skin changes, nausea, vomiting, or any other concerns.  A detailed ROS was otherwise non contributory today.    HISTORY OF CURRENT ILLNESS: From the original intake note:  [name] is a 73 year old [place] man with a history of type 2 diabetes complicated by significant retinopathy and end-stage renal disease, status post renal transplant, followed by Dr. Marval Regal.  [name] that he was feeling more fatigued than usual,  and that last time he felt this way, his Vitamin D levels were low. He called his doctors office to ask for his blood work to be ran before he was sent to the ED by Dr. Marlou Sa. The patient presented to the emergency room 03/22/2019 with symptomatic anemia, was found to have hemoglobin of 5.9 and pancytopenia.  Additional work-up is listed below in the assessment section, but there was no evidence of B12, folate or iron deficiency and reticulocyte count was inappropriately normal.  HIV and SARS coronavirus 2 tests were negative.  Creatinine was 1.49 with GFR was 54.  Glucose was 221.  Other labs of interest obtained 03/23/2019 included a B12 level of 944, folate 13.2, ferritin 1060, with serum iron 134, saturation 41%.  The absolute reticulocyte count was 64.5.  His end-stage renal disease is felt to be due to diabetes and hypertension.  He was on dialysis starting 2010 but on 03/31/2015 he underwent a cadaveric renal transplant at [facility].  He had transient CMV viremia, which was treated.  He continues on Bactrim prophylaxis and tacrolimus.  He also receives mycophenolate and when he had the episode of severe anemia and a mycophenolate was decreased in half and his Bactrim was decreased to Monday Wednesday Friday.  His tacrolimus level was on the high side.  This has been since adjusted by Dr. Marval Regal and is being repeated today  The patient was transfused and discharged and referred here for evaluation of pancytopenia   PAST MEDICAL HISTORY: Past Medical History:  Diagnosis Date   Anemia  hx of at beginnning of dialysis- 05/2009   Arthritis    right hip    End stage renal disease ([place])    [place] [facility]    End stage renal disease on dialysis [facility])    Essential hypertension, malignant    Hypertrophy of prostate without urinary obstruction and other lower urinary tract symptoms (LUTS)    Myelodysplasia (myelodysplastic syndrome) ([place]) 05/20/2019     Pure hypercholesterolemia    Renal insufficiency    Type II or unspecified type diabetes mellitus without mention of complication, not stated as uncontrolled    TYPE 2      PAST SURGICAL HISTORY: Past Surgical History:  Procedure Laterality Date   A/V FISTULAGRAM Left 06/12/2017   Procedure: A/V Fistulagram;  Surgeon: Algernon Huxley, MD;  Location: [place] [facility];  Service: Cardiovascular;  Laterality: Left;   CARDIAC CATHETERIZATION  04/29/10   normal coronaries, LVEF 45%, mild global LV hypokinesis ([place])   CATARACT EXTRACTION W/PHACO Right 04/03/2013   Procedure: CATARACT EXTRACTION PHACO AND INTRAOCULAR LENS PLACEMENT ([place]);  Surgeon: Adonis Brook, MD;  Location: [place];  Service: Ophthalmology;  Laterality: Right;   CERVICAL DISCECTOMY  03/1999   COLONOSCOPY     Hx: of   CYSTOSCOPY WITH BIOPSY  11/01/2012   Procedure: CYSTOSCOPY WITH BIOPSY;  Surgeon: Molli Hazard, MD;  Location: [facility];  Service: Urology;  Laterality: N/A;  with fulgeration   CYSTOSCOPY WITH RETROGRADE PYELOGRAM, URETEROSCOPY AND STENT PLACEMENT  11/01/2012   Procedure: CYSTOSCOPY WITH RETROGRADE PYELOGRAM, URETEROSCOPY AND STENT PLACEMENT;  Surgeon: Molli Hazard, MD;  [facility];  Service: Urology;  Laterality: Bilateral;   DIALYSIS FISTULA CREATION  2010   EYE SURGERY  2012   cataracts   EYE SURGERY  11/2014   HOLMIUM LASER APPLICATION  06/11/4157   Procedure: HOLMIUM LASER APPLICATION;  Surgeon: Molli Hazard, MD;  [facility];  Service: Urology;  Laterality: Bilateral;   KIDNEY TRANSPLANT  2016   MEMBRANE PEEL Right 09/04/2013   Procedure: MEMBRANE PEEL;  Surgeon: Adonis Brook, MD;  Location: [place];  Service: Ophthalmology;  Laterality: Right;   NECK SURGERY  1990   PARS PLANA VITRECTOMY Right 09/04/2013   Procedure: PARS PLANA VITRECTOMY WITH 23 GAUGE;  Surgeon: Adonis Brook, MD;  Location: [place];  Service: Ophthalmology;  Laterality:  Right;   PARS PLANA VITRECTOMY Left 02/17/2015   PARS PLANA VITRECTOMY Left 02/17/2015   Procedure: PARS PLANA VITRECTOMY WITH 25G REMOVAL/SUTURE INTRAOCULAR LENS;  Surgeon: Hayden Pedro, MD;  Location: [place];  Service: Ophthalmology;  Laterality: Left;   PERIPHERAL VASCULAR CATHETERIZATION Left 08/08/2016   Procedure: A/V Shuntogram/Fistulagram;  Surgeon: Algernon Huxley, MD;  Location: [place] [facility];  Service: Cardiovascular;  Laterality: Left;   PERIPHERAL VASCULAR CATHETERIZATION N/A 08/08/2016   Procedure: A/V Shunt Intervention;  Surgeon: Algernon Huxley, MD;  Location: [place] [facility];  Service: Cardiovascular;  Laterality: N/A;   PHOTOCOAGULATION WITH LASER Right 09/04/2013   Procedure: PHOTOCOAGULATION WITH LASER;  Surgeon: Adonis Brook, MD;  Location: [place];  Service: Ophthalmology;  Laterality: Right;  ENDOLASER   TRANSTHORACIC ECHOCARDIOGRAM  06/19/2012   normal LV sys function, EF 55-60%, mild to mod diastolic dysfunction, mild AI ([place])  tonsillectomy @ 73 years of age   FAMILY HISTORY: Family History  Problem Relation Age of Onset   Breast cancer Mother 11       deceased   Pneumonia Father 21  deceased   Hypertension Brother    Diabetes Mellitus II Brother    Cancer Sister        Intestinal   The patient's s father died from pneumonia at age 35.  He was originally a Psychologist, sport and exercise and later worked in a Designer, television/film set.  Patients' mother died from breast cancer at age 68. The patient has 7 siblings. Patient denies anyone in her family having breast, ovarian, prostate, or pancreatic cancer.  1 sister died from intestinal cancer.  Another sister, Katharine Look, died from an aneurism.     SOCIAL HISTORY: (Current as of 04/22/2019) [name] retired from the [employer] office. 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 He Savage that he was exposed to agent orange. His wife, [name], is a retired Corporate treasurer. They have two children, Lennette Savage and  Lauren. Lennette Savage lives in [place] and works at the [employer] for [employer]. Lauren lives in [place] and teaches handicapped children for the [place] school [employer].Kennith Center has two grandchildren.  His daughter is expecting in August 2020. The patient he belongs to [employer] on [place]: In the absence of any documentation, Terrance's spouse is automatically her healthcare power of attorney.     HEALTH MAINTENANCE: Social History   Tobacco Use   Smoking status: Former Smoker    Types: Cigarettes    Quit date: 10/02/1990    Years since quitting: 28.8   Smokeless tobacco: Never Used  Substance Use Topics   Alcohol use: Yes    Comment: occasional   Drug use: No    Colonoscopy: up to date/ [facility]  Allergies  Allergen Reactions   Codeine Nausea And Vomiting and Nausea Only   Doxycycline Nausea And Vomiting and Nausea Only   Latex Hives and Rash   Tape Other (See Comments)    The "plastic, waffle-printed" tape tears and bruises the skin    Current Outpatient Medications  Medication Sig Dispense Refill   acetaminophen (TYLENOL) 325 MG tablet Take 650 mg by mouth every 6 (six) hours as needed (for pain or headaches).      allopurinol (ZYLOPRIM) 300 MG tablet Take 300 mg by mouth as needed (as directed for gout flares).      amLODipine (NORVASC) 5 MG tablet Take 1 tablet (5 mg total) by mouth daily. 30 tablet 0   betamethasone dipropionate (DIPROLENE) 0.05 % ointment      cloNIDine (CATAPRES) 0.1 MG tablet Take 0.1 mg by mouth 3 (three) times daily.     donepezil (ARICEPT) 10 MG tablet Take 10 mg by mouth at bedtime.     dorzolamide (TRUSOPT) 2 % ophthalmic solution Place 1 drop into both eyes 2 (two) times a day.      doxazosin (CARDURA) 4 MG tablet Take 4 mg by mouth 2 (two) times daily.     furosemide (LASIX) 40 MG tablet Take 40-80 mg by mouth See admin instructions. Take 80 mg by mouth in the morning and 40 mg in the evening       HUMALOG KWIKPEN 100 UNIT/ML KiwkPen Inject 8-16 Units into the skin See admin instructions. Inject 8-16 units into the skin three times a day before meals, PER SLIDING SCALE (BGL 150 or less, use nothing)  3   Insulin Glargine (LANTUS SOLOSTAR) 100 UNIT/ML Solostar Pen Inject 34 Units into the skin daily before breakfast.      K Phos Mono-Sod Phos Di & Mono (PHOSPHA 250 NEUTRAL) [phone]  MG TABS Take 1-2 tablets by mouth See admin instructions. Take 2 tablets by mouth in the morning and 1 tablet at bedtime at 8 PM     ketoconazole (NIZORAL) 2 % shampoo Apply 1 application topically every 7 (seven) days.   2   KLOR-CON M20 20 MEQ tablet Take 20 mEq by mouth daily.  1   latanoprost (XALATAN) 0.005 % ophthalmic solution Place 1 drop into both eyes at bedtime.      linagliptin (TRADJENTA) 5 MG TABS tablet Take 5 mg by mouth daily with breakfast.      losartan (COZAAR) 100 MG tablet Take 100 mg by mouth daily.     metoprolol tartrate (LOPRESSOR) 25 MG tablet Take 25 mg by mouth 2 (two) times daily.     Multiple Vitamins-Minerals (ONE-A-DAY MENS 50+ ADVANTAGE) TABS Take 1 tablet by mouth daily with breakfast.     mupirocin cream (BACTROBAN) 2 % Apply 1 application topically 2 (two) times daily. 15 g 0   mycophenolate (MYFORTIC) 180 MG EC tablet Take 1 tablet (180 mg total) by mouth 2 (two) times daily. 60 tablet 0   NON FORMULARY Apply 1 application topically See admin instructions. Skin-So-Soft lotion: Apply as needed to dry sites of skin     ondansetron (ZOFRAN) 8 MG tablet Take 1 tablet (8 mg total) by mouth 2 (two) times daily as needed (Nausea or vomiting). 30 tablet 1   sodium chloride (OCEAN) 0.65 % SOLN nasal spray Place 2 sprays into both nostrils as needed for congestion.      sulfamethoxazole-trimethoprim (BACTRIM) 400-80 MG tablet Take 1 tablet by mouth every Monday, Wednesday, and Friday.     tacrolimus (PROGRAF) 0.5 MG capsule Take 0.5 mg by mouth 2 (two) times daily.      tacrolimus (PROGRAF) 1 MG capsule Take 3 mg by mouth 2 (two) times daily.   11   tadalafil (CIALIS) 20 MG tablet Take 20 mg by mouth daily as needed for erectile dysfunction.      VIGAMOX 0.5 % ophthalmic solution INSTILL 1 DROP INTO RIGHT EYE 4 TIMES A DAY     Vitamin D, Ergocalciferol, (DRISDOL) 50000 units CAPS capsule Take 50,000 Units by mouth every 7 (seven) days.      No current facility-administered medications for this visit.      OBJECTIVE: Older African-American man who appears stated age  59:   08/05/19 1232  BP: (!) 159/65  Pulse: (!) 58  Resp: 17  Temp: 98.9 F (37.2 C)  SpO2: 100%   Wt Readings from Last 3 Encounters:  08/05/19 201 lb 6.4 oz (91.4 kg)  07/17/19 206 lb 1.6 oz (93.5 kg)  07/11/19 205 lb 14.4 oz (93.4 kg)   Body mass index is 26.57 kg/m.    ECOG FS:2 - Symptomatic, <50% confined to bed  GENERAL: Patient is a chronically ill appearing older man HEENT:  Sclerae anicteric.  Mask in place Neck is supple.  NODES:  No cervical, supraclavicular, or axillary lymphadenopathy palpated.  LUNGS:  Clear to auscultation bilaterally.  No wheezes or rhonchi. HEART:  Regular rate and rhythm. No murmur appreciated. ABDOMEN:  Soft, nontender.  Positive, normoactive bowel sounds. No organomegaly palpated. MSK:  No focal spinal tenderness to palpation.[name] EXTREMITIES:  No peripheral edema.   SKIN:  Clear with no obvious rashes or skin changes. No nail dyscrasia. NEURO:  Nonfocal. Well oriented.  Appropriate affect.    LAB RESULTS:   CMP     Component Value Date/Time  NA 140 07/15/2019 1314   NA 135 (L) 07/04/2013 1256   K 3.9 07/15/2019 1314   K 4.4 07/11/2013 1108   CL 106 07/15/2019 1314   CL 96 (L) 07/04/2013 1256   CO2 28 07/15/2019 1314   CO2 32 07/04/2013 1256   GLUCOSE 151 (H) 07/15/2019 1314   GLUCOSE 189 (H) 07/04/2013 1256   BUN 17 07/15/2019 1314   BUN 26 (H) 07/04/2013 1256   CREATININE 1.36 (H) 07/15/2019 1314   CREATININE  1.35 (H) 04/26/2019 1455   CREATININE 10.86 (H) 07/04/2013 1256   CALCIUM 10.2 07/15/2019 1314   CALCIUM 8.8 07/04/2013 1256   PROT 6.8 07/15/2019 1314   ALBUMIN 3.8 07/15/2019 1314   AST 11 (L) 07/15/2019 1314   AST 23 04/26/2019 1455   ALT 7 07/15/2019 1314   ALT 19 04/26/2019 1455   ALKPHOS 89 07/15/2019 1314   BILITOT 1.2 07/15/2019 1314   BILITOT 0.9 04/26/2019 1455   GFRNONAA 52 (L) 07/15/2019 1314   GFRNONAA 53 (L) 04/26/2019 1455   GFRNONAA 4 (L) 07/04/2013 1256   GFRAA >60 07/15/2019 1314   GFRAA >60 04/26/2019 1455   GFRAA 5 (L) 07/04/2013 1256    No results found for: TOTALPROTELP, ALBUMINELP, A1GS, A2GS, BETS, BETA2SER, GAMS, MSPIKE, SPEI  No results found for: KPAFRELGTCHN, LAMBDASER, KAPLAMBRATIO  Lab Results  Component Value Date   WBC 5.3 08/05/2019   NEUTROABS PENDING 08/05/2019   HGB 7.8 (L) 08/05/2019   HCT 24.7 (L) 08/05/2019   MCV 103.8 (H) 08/05/2019   PLT 52 (L) 08/05/2019    '@LASTCHEMISTRY'$ @  No results found for: LABCA2  No components found for: [id number]  No results for input(s): INR in the last 168 hours.  No results found for: LABCA2  No results found for: WUJ811  No results found for: BJY782  No results found for: NFA213  No results found for: CA2729  No components found for: HGQUANT  No results found for: CEA1 / No results found for: CEA1   No results found for: AFPTUMOR  No results found for: CHROMOGRNA  No results found for: PSA1  Appointment on 08/05/2019  Component Date Value Ref Range Status   WBC 08/05/2019 5.3  4.0 - 10.5 K/uL Final   RBC 08/05/2019 2.38* 4.22 - 5.81 MIL/uL Final   Hemoglobin 08/05/2019 7.8* 13.0 - 17.0 g/dL Final   HCT 08/05/2019 24.7* 39.0 - 52.0 % Final   MCV 08/05/2019 103.8* 80.0 - 100.0 fL Final   MCH 08/05/2019 32.8  26.0 - 34.0 pg Final   MCHC 08/05/2019 31.6  30.0 - 36.0 g/dL Final   RDW 08/05/2019 27.7* 11.5 - 15.5 % Final   Platelets 08/05/2019 52* 150 - 400 K/uL Final    Comment: Immature Platelet Fraction may be clinically indicated, consider ordering this additional test [id number]    nRBC 08/05/2019 4.4* 0.0 - 0.2 % Final   Performed at [facility], [place] [address] [place], [place] [address]   Neutrophils Relative % 08/05/2019 PENDING  % Incomplete   Neutro Abs 08/05/2019 PENDING  1.7 - 7.7 K/uL Incomplete   Band Neutrophils 08/05/2019 PENDING  % Incomplete   Lymphocytes Relative 08/05/2019 PENDING  % Incomplete   Lymphs Abs 08/05/2019 PENDING  0.7 - 4.0 K/uL Incomplete   Monocytes Relative 08/05/2019 PENDING  % Incomplete   Monocytes Absolute 08/05/2019 PENDING  0.1 - 1.0 K/uL Incomplete   Eosinophils Relative 08/05/2019 PENDING  % Incomplete   Eosinophils Absolute 08/05/2019 PENDING  0.0 - 0.5 K/uL Incomplete   Basophils Relative 08/05/2019 PENDING  % Incomplete   Basophils Absolute 08/05/2019 PENDING  0.0 - 0.1 K/uL Incomplete   WBC Morphology 08/05/2019 PENDING   Incomplete   RBC Morphology 08/05/2019 PENDING   Incomplete   Smear Review 08/05/2019 PENDING   Incomplete   Other 08/05/2019 PENDING  % Incomplete   nRBC 08/05/2019 PENDING  0 /100 WBC Incomplete   Metamyelocytes Relative 08/05/2019 PENDING  % Incomplete   Myelocytes 08/05/2019 PENDING  % Incomplete   Promyelocytes Relative 08/05/2019 PENDING  % Incomplete   Blasts 08/05/2019 PENDING  % Incomplete   Immature Granulocytes 08/05/2019 PENDING  % Incomplete   Abs Immature Granulocytes 08/05/2019 PENDING  0.00 - 0.07 K/uL Incomplete    (this displays the last labs from the last 3 days)  No results found for: TOTALPROTELP, ALBUMINELP, A1GS, A2GS, BETS, BETA2SER, GAMS, MSPIKE, SPEI (this displays SPEP labs)  No results found for: KPAFRELGTCHN, LAMBDASER, KAPLAMBRATIO (kappa/lambda light chains)  No results found for: HGBA, HGBA2QUANT, HGBFQUANT, HGBSQUAN (Hemoglobinopathy evaluation)   Lab Results  Component Value Date   LDH 231 (H)  07/22/2019    Lab Results  Component Value Date   IRON 134 03/23/2019   TIBC 326 03/23/2019   IRONPCTSAT 41 (H) 03/23/2019   (Iron and TIBC)  Lab Results  Component Value Date   FERRITIN 2,250 (H) 07/22/2019    Urinalysis    Component Value Date/Time   COLORURINE YELLOW 03/22/2019 [place] 03/22/2019 1609   LABSPEC 1.010 03/22/2019 1609   PHURINE 6.0 03/22/2019 1609   GLUCOSEU NEGATIVE 03/22/2019 [place] 03/22/2019 [place] 03/22/2019 1609   KETONESUR NEGATIVE 03/22/2019 1609   PROTEINUR NEGATIVE 03/22/2019 1609   UROBILINOGEN 0.2 08/17/2012 1120   NITRITE NEGATIVE 03/22/2019 [place] 03/22/2019 1609     STUDIES:  No results found.   ELIGIBLE FOR AVAILABLE RESEARCH PROTOCOL: no   ASSESSMENT: 73 y.o. [place] man with a history of end-stage renal disease secondary to hypertension and diabetes, on hemodialysis as of 2010  (a) status post renal transplant at [facility] 03/31/2015  (1) pancytopenia noted 03/22/2019 with fatigue and shortness of breath  (a) smear review confirmed pancytopenia and macrocytosis with hypogranular polys   (b) Bone Marrow Biopsy on 05/02/2019 shows hypercellular marrow with trilineal dysplasia, slightly left shifted myeloid maturation but no increase in blast, mild myelofibrosis  (c) Flow Cytometry shows no increase in CD34 positive blasts and left shifted myeloid maturation   (d) 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   (2) myelodysplasia, as per #1 above  (a) prognosis: Moderate risk  (3) aranesp started 05/22/2019, repeated weekly  (a) baseline epo level >500, which predicts a poor response to EPO  (b) ferritin 1060 on 03/23/2019  (4) azacytidine started 07/08/2019, initially repeated daily x5,  then x7 days  with subsequent cycles  PLAN:  [name] is doing moderately well today.  He will proceed with Azacytidine this week, he is tolerating it well.  I reviewed his lab work with him which is slightly improved from previous appointments, this is hopeful.  He will continue to receive Retacrit.    [name] and I discussed his weight loss.  I gave him suggestions of increasing his protein and caloric intake.  I also gave him suggestions of exercises he  can do from his home to maintain his strength.  I placed a referral for him to see our nutritionist about his weight loss as well.    [name] does not need a blood transfusion today.  We will monitor his counts closely and transfuse for hemoglobin less than 7 or for increasing symptoms.   He was given a calendar and will return as scheduled.  He sees Dr. Jana Hakim in 2 weeks.  He was recommended to continue to follow current pandemic precatuions.  He knows to call for any other issue that may develop before the next visit.  A total of (30) minutes of face-to-face time was spent with this patient with greater than 50% of that time in counseling and care-coordination.   David Bihari, NP  08/05/19 12:41 PM Medical Oncology and Hematology [facility] [place], [place] [address] Tel. [phone]    Fax. [phone]

## 2019-08-06 ENCOUNTER — Other Ambulatory Visit: Payer: Self-pay

## 2019-08-06 ENCOUNTER — Telehealth: Payer: Self-pay | Admitting: Adult Health

## 2019-08-06 ENCOUNTER — Inpatient Hospital Stay: Payer: Medicare Other

## 2019-08-06 VITALS — BP 166/57 | HR 61 | Temp 99.1°F | Resp 18

## 2019-08-06 DIAGNOSIS — D696 Thrombocytopenia, unspecified: Secondary | ICD-10-CM

## 2019-08-06 DIAGNOSIS — D61818 Other pancytopenia: Secondary | ICD-10-CM

## 2019-08-06 DIAGNOSIS — D469 Myelodysplastic syndrome, unspecified: Secondary | ICD-10-CM

## 2019-08-06 DIAGNOSIS — Z5111 Encounter for antineoplastic chemotherapy: Secondary | ICD-10-CM | POA: Diagnosis not present

## 2019-08-06 LAB — CBC WITH DIFFERENTIAL/PLATELET
Abs Immature Granulocytes: 1.41 10*3/uL — ABNORMAL HIGH (ref 0.00–0.07)
Basophils Absolute: 0 10*3/uL (ref 0.0–0.1)
Basophils Relative: 0 %
Eosinophils Absolute: 0 10*3/uL (ref 0.0–0.5)
Eosinophils Relative: 0 %
HCT: 26.8 % — ABNORMAL LOW (ref 39.0–52.0)
Hemoglobin: 8.4 g/dL — ABNORMAL LOW (ref 13.0–17.0)
Immature Granulocytes: 26 %
Lymphocytes Relative: 23 %
Lymphs Abs: 1.3 10*3/uL (ref 0.7–4.0)
MCH: 32.6 pg (ref 26.0–34.0)
MCHC: 31.3 g/dL (ref 30.0–36.0)
MCV: 103.9 fL — ABNORMAL HIGH (ref 80.0–100.0)
Monocytes Absolute: 0.8 10*3/uL (ref 0.1–1.0)
Monocytes Relative: 14 %
Neutro Abs: 2 10*3/uL (ref 1.7–7.7)
Neutrophils Relative %: 37 %
Platelets: 40 10*3/uL — ABNORMAL LOW (ref 150–400)
RBC: 2.58 MIL/uL — ABNORMAL LOW (ref 4.22–5.81)
RDW: 27.5 % — ABNORMAL HIGH (ref 11.5–15.5)
WBC: 5.5 10*3/uL (ref 4.0–10.5)
nRBC: 3.7 % — ABNORMAL HIGH (ref 0.0–0.2)

## 2019-08-06 MED ORDER — ONDANSETRON HCL 8 MG PO TABS
8.0000 mg | ORAL_TABLET | Freq: Once | ORAL | Status: AC
  Administered 2019-08-06: 8 mg via ORAL

## 2019-08-06 MED ORDER — AZACITIDINE CHEMO SQ INJECTION
75.0000 mg/m2 | Freq: Once | INTRAMUSCULAR | Status: AC
  Administered 2019-08-06: 165 mg via SUBCUTANEOUS
  Filled 2019-08-06: qty 6.6

## 2019-08-06 MED ORDER — ONDANSETRON HCL 8 MG PO TABS
ORAL_TABLET | ORAL | Status: AC
  Filled 2019-08-06: qty 1

## 2019-08-06 NOTE — Patient Instructions (Signed)
[facility] Discharge Instructions for Patients Receiving Chemotherapy  Today you received the following chemotherapy agents Azacitidine (VIDAZA).  To help prevent nausea and vomiting after your treatment, we encourage you to take your nausea medication as prescribed.   If you develop nausea and vomiting that is not controlled by your nausea medication, call the clinic.   BELOW ARE SYMPTOMS THAT SHOULD BE REPORTED IMMEDIATELY:  *FEVER GREATER THAN 100.5 F  *CHILLS WITH OR WITHOUT FEVER  NAUSEA AND VOMITING THAT IS NOT CONTROLLED WITH YOUR NAUSEA MEDICATION  *UNUSUAL SHORTNESS OF BREATH  *UNUSUAL BRUISING OR BLEEDING  TENDERNESS IN MOUTH AND THROAT WITH OR WITHOUT PRESENCE OF ULCERS  *URINARY PROBLEMS  *BOWEL PROBLEMS  UNUSUAL RASH Items with * indicate a potential emergency and should be followed up as soon as possible.  Feel free to call the clinic should you have any questions or concerns. The clinic phone number is [phone].  Please show the CHEMO ALERT CARD at check-in to the Emergency Department and triage nurse.  Coronavirus (COVID-19) Are you at risk?  Are you at risk for the Coronavirus (COVID-19)?  To be considered HIGH RISK for Coronavirus (COVID-19), you have to meet the following criteria:  . Traveled to [place], [place], [place], [place] or [place]; or in the [place] to [place], [place], [place], or [place]; and have fever, cough, and shortness of breath within the last 2 weeks of travel OR . Been in close contact with a person diagnosed with COVID-19 within the last 2 weeks and have fever, cough, and shortness of breath . IF YOU DO NOT MEET THESE CRITERIA, YOU ARE CONSIDERED LOW RISK FOR COVID-19.  What to do if you are HIGH RISK for COVID-19?  . If you are having a medical emergency, call 911. . Seek medical care right away. Before you go to a doctor's office, urgent care or emergency department, call ahead and tell them  about your recent travel, contact with someone diagnosed with COVID-19, and your symptoms. You should receive instructions from your physician's office regarding next steps of care.  . When you arrive at healthcare provider, tell the healthcare staff immediately you have returned from visiting [place], [place], [place], [place] or [place]; or traveled in the [place] to [place], [place], [place], or [place]; in the last two weeks or you have been in close contact with a person diagnosed with COVID-19 in the last 2 weeks.   . Tell the health care staff about your symptoms: fever, cough and shortness of breath. . After you have been seen by a medical provider, you will be either: o Tested for (COVID-19) and discharged home on quarantine except to seek medical care if symptoms worsen, and asked to  - Stay home and avoid contact with others until you get your results (4-5 days)  - Avoid travel on public transportation if possible (such as bus, train, or airplane) or o Sent to the Emergency Department by EMS for evaluation, COVID-19 testing, and possible admission depending on your condition and test results.  What to do if you are LOW RISK for COVID-19?  Reduce your risk of any infection by using the same precautions used for avoiding the common cold or flu:  . Wash your hands often with soap and warm water for at least 20 seconds.  If soap and water are not readily available, use an alcohol-based hand sanitizer with at least 60% alcohol.  . If coughing or   sneezing, cover your mouth and nose by coughing or sneezing into the elbow areas of your shirt or coat, into a tissue or into your sleeve (not your hands). . Avoid shaking hands with others and consider head nods or verbal greetings only. . Avoid touching your eyes, nose, or mouth with unwashed hands.  . Avoid close contact with people who are sick. . Avoid places or events with large numbers of people in one location, like concerts or  sporting events. . Carefully consider travel plans you have or are making. . If you are planning any travel outside or inside the [place], visit the CDC's Travelers' Health webpage for the latest health notices. . If you have some symptoms but not all symptoms, continue to monitor at home and seek medical attention if your symptoms worsen. . If you are having a medical emergency, call 911.   ADDITIONAL HEALTHCARE OPTIONS FOR PATIENTS  [facility] / e-Visit: [email or web address]         [facility]: [phone]  [facility]: [phone]                   [facility]: [phone]   

## 2019-08-06 NOTE — Telephone Encounter (Signed)
Per 10/19 los nutrition appt was already scheduled.

## 2019-08-07 ENCOUNTER — Other Ambulatory Visit: Payer: Self-pay

## 2019-08-07 ENCOUNTER — Inpatient Hospital Stay: Payer: Medicare Other

## 2019-08-07 ENCOUNTER — Other Ambulatory Visit: Payer: Self-pay | Admitting: Oncology

## 2019-08-07 VITALS — BP 130/53 | HR 66 | Temp 98.9°F | Resp 18

## 2019-08-07 DIAGNOSIS — D61818 Other pancytopenia: Secondary | ICD-10-CM

## 2019-08-07 DIAGNOSIS — Z5111 Encounter for antineoplastic chemotherapy: Secondary | ICD-10-CM | POA: Diagnosis not present

## 2019-08-07 DIAGNOSIS — D469 Myelodysplastic syndrome, unspecified: Secondary | ICD-10-CM

## 2019-08-07 DIAGNOSIS — D696 Thrombocytopenia, unspecified: Secondary | ICD-10-CM

## 2019-08-07 LAB — CBC WITH DIFFERENTIAL/PLATELET
Abs Immature Granulocytes: 1.78 10*3/uL — ABNORMAL HIGH (ref 0.00–0.07)
Basophils Absolute: 0 10*3/uL (ref 0.0–0.1)
Basophils Relative: 1 %
Eosinophils Absolute: 0 10*3/uL (ref 0.0–0.5)
Eosinophils Relative: 0 %
HCT: 24.8 % — ABNORMAL LOW (ref 39.0–52.0)
Hemoglobin: 7.7 g/dL — ABNORMAL LOW (ref 13.0–17.0)
Immature Granulocytes: 27 %
Lymphocytes Relative: 16 %
Lymphs Abs: 1.1 10*3/uL (ref 0.7–4.0)
MCH: 32.6 pg (ref 26.0–34.0)
MCHC: 31 g/dL (ref 30.0–36.0)
MCV: 105.1 fL — ABNORMAL HIGH (ref 80.0–100.0)
Monocytes Absolute: 1.3 10*3/uL — ABNORMAL HIGH (ref 0.1–1.0)
Monocytes Relative: 20 %
Neutro Abs: 2.4 10*3/uL (ref 1.7–7.7)
Neutrophils Relative %: 36 %
Platelets: 50 10*3/uL — ABNORMAL LOW (ref 150–400)
RBC: 2.36 MIL/uL — ABNORMAL LOW (ref 4.22–5.81)
RDW: 27.3 % — ABNORMAL HIGH (ref 11.5–15.5)
WBC: 6.5 10*3/uL (ref 4.0–10.5)
nRBC: 3.2 % — ABNORMAL HIGH (ref 0.0–0.2)

## 2019-08-07 MED ORDER — ACETAMINOPHEN 325 MG PO TABS
ORAL_TABLET | ORAL | Status: AC
  Filled 2019-08-07: qty 2

## 2019-08-07 MED ORDER — ONDANSETRON HCL 8 MG PO TABS
ORAL_TABLET | ORAL | Status: AC
  Filled 2019-08-07: qty 1

## 2019-08-07 MED ORDER — AZACITIDINE CHEMO SQ INJECTION
75.0000 mg/m2 | Freq: Once | INTRAMUSCULAR | Status: AC
  Administered 2019-08-07: 165 mg via SUBCUTANEOUS
  Filled 2019-08-07: qty 6.6

## 2019-08-07 MED ORDER — ONDANSETRON HCL 8 MG PO TABS
8.0000 mg | ORAL_TABLET | Freq: Once | ORAL | Status: AC
  Administered 2019-08-07: 8 mg via ORAL

## 2019-08-07 MED ORDER — ACETAMINOPHEN 325 MG PO TABS
650.0000 mg | ORAL_TABLET | Freq: Four times a day (QID) | ORAL | Status: DC | PRN
  Administered 2019-08-07: 650 mg via ORAL

## 2019-08-07 NOTE — Progress Notes (Signed)
Ok to treat with hgb 7.7 and plt. 50 per Dr. Jana Hakim.

## 2019-08-07 NOTE — Patient Instructions (Signed)
[facility] Discharge Instructions for Patients Receiving Chemotherapy  Today you received the following chemotherapy agents Azacitidine (VIDAZA).  To help prevent nausea and vomiting after your treatment, we encourage you to take your nausea medication as prescribed.   If you develop nausea and vomiting that is not controlled by your nausea medication, call the clinic.   BELOW ARE SYMPTOMS THAT SHOULD BE REPORTED IMMEDIATELY:  *FEVER GREATER THAN 100.5 F  *CHILLS WITH OR WITHOUT FEVER  NAUSEA AND VOMITING THAT IS NOT CONTROLLED WITH YOUR NAUSEA MEDICATION  *UNUSUAL SHORTNESS OF BREATH  *UNUSUAL BRUISING OR BLEEDING  TENDERNESS IN MOUTH AND THROAT WITH OR WITHOUT PRESENCE OF ULCERS  *URINARY PROBLEMS  *BOWEL PROBLEMS  UNUSUAL RASH Items with * indicate a potential emergency and should be followed up as soon as possible.  Feel free to call the clinic should you have any questions or concerns. The clinic phone number is [phone].  Please show the CHEMO ALERT CARD at check-in to the Emergency Department and triage nurse.   

## 2019-08-08 ENCOUNTER — Inpatient Hospital Stay: Payer: Medicare Other | Admitting: Nutrition

## 2019-08-08 ENCOUNTER — Other Ambulatory Visit: Payer: Self-pay

## 2019-08-08 ENCOUNTER — Inpatient Hospital Stay: Payer: Medicare Other

## 2019-08-08 VITALS — BP 132/53 | HR 62 | Temp 98.9°F | Resp 18

## 2019-08-08 DIAGNOSIS — D469 Myelodysplastic syndrome, unspecified: Secondary | ICD-10-CM

## 2019-08-08 DIAGNOSIS — D696 Thrombocytopenia, unspecified: Secondary | ICD-10-CM

## 2019-08-08 DIAGNOSIS — Z5111 Encounter for antineoplastic chemotherapy: Secondary | ICD-10-CM | POA: Diagnosis not present

## 2019-08-08 MED ORDER — ONDANSETRON HCL 8 MG PO TABS
8.0000 mg | ORAL_TABLET | Freq: Once | ORAL | Status: AC
  Administered 2019-08-08: 15:00:00 8 mg via ORAL

## 2019-08-08 MED ORDER — ONDANSETRON HCL 8 MG PO TABS
ORAL_TABLET | ORAL | Status: AC
  Filled 2019-08-08: qty 1

## 2019-08-08 MED ORDER — AZACITIDINE CHEMO SQ INJECTION
75.0000 mg/m2 | Freq: Once | INTRAMUSCULAR | Status: AC
  Administered 2019-08-08: 165 mg via SUBCUTANEOUS
  Filled 2019-08-08: qty 6.6

## 2019-08-08 NOTE — Progress Notes (Signed)
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  He is a patient of Dr. Jana Hakim.  Past medical history includes diabetes, end-stage renal disease, HD, transplant, hypercholesterolemia.  Medications include Lasix, Lantus, Zofran, vitamin D.  Labs were reviewed.  Height: 6 feet 1 inch. Weight: 201 pounds. Usual body weight: 214 pounds September 2. BMI: 26.57.  Patient endorses weight loss.  Reports that he wanted to lose weight but he did not try to lose this much weight. He denies nausea and vomiting. Reports early satiety. Reports one episode of diarrhea after injection.  Nutrition diagnosis: Unintended weight loss related to inadequate oral intake as evidenced by 6% weight loss over 6 weeks.  Intervention: Patient was educated to consume smaller amounts more often. Encourage high-calorie, high-protein foods. Recommended oral nutrition supplements between meals.  Provided coupons and samples. Reviewed snack options. Questions were answered.  Teach back method used.  Contact information provided.  Monitoring, evaluation, goals: Patient will tolerate increased calories and protein to promote weight maintenance.  Next visit: Monday, November 16 during infusion.  **Disclaimer: This note was dictated with voice recognition software. Similar sounding words can inadvertently be transcribed and this note may contain transcription errors which may not have been corrected upon publication of note.**

## 2019-08-08 NOTE — Patient Instructions (Signed)
[facility] Discharge Instructions for Patients Receiving Chemotherapy  Today you received the following chemotherapy agents Vidaza  To help prevent nausea and vomiting after your treatment, we encourage you to take your nausea medication as directed  If you develop nausea and vomiting that is not controlled by your nausea medication, call the clinic.   BELOW ARE SYMPTOMS THAT SHOULD BE REPORTED IMMEDIATELY:  *FEVER GREATER THAN 100.5 F  *CHILLS WITH OR WITHOUT FEVER  NAUSEA AND VOMITING THAT IS NOT CONTROLLED WITH YOUR NAUSEA MEDICATION  *UNUSUAL SHORTNESS OF BREATH  *UNUSUAL BRUISING OR BLEEDING  TENDERNESS IN MOUTH AND THROAT WITH OR WITHOUT PRESENCE OF ULCERS  *URINARY PROBLEMS  *BOWEL PROBLEMS  UNUSUAL RASH Items with * indicate a potential emergency and should be followed up as soon as possible.  Feel free to call the clinic should you have any questions or concerns. The clinic phone number is [phone].  Please show the CHEMO ALERT CARD at check-in to the Emergency Department and triage nurse.   

## 2019-08-09 ENCOUNTER — Other Ambulatory Visit: Payer: Self-pay

## 2019-08-09 ENCOUNTER — Inpatient Hospital Stay: Payer: Medicare Other

## 2019-08-09 VITALS — BP 144/74 | HR 62 | Temp 98.7°F | Resp 17

## 2019-08-09 DIAGNOSIS — D469 Myelodysplastic syndrome, unspecified: Secondary | ICD-10-CM

## 2019-08-09 DIAGNOSIS — D696 Thrombocytopenia, unspecified: Secondary | ICD-10-CM

## 2019-08-09 DIAGNOSIS — Z5111 Encounter for antineoplastic chemotherapy: Secondary | ICD-10-CM | POA: Diagnosis not present

## 2019-08-09 LAB — COPPER, SERUM: Copper: 101 ug/dL (ref 72–166)

## 2019-08-09 MED ORDER — ONDANSETRON HCL 8 MG PO TABS
8.0000 mg | ORAL_TABLET | Freq: Once | ORAL | Status: AC
  Administered 2019-08-09: 14:00:00 8 mg via ORAL

## 2019-08-09 MED ORDER — ONDANSETRON HCL 8 MG PO TABS
ORAL_TABLET | ORAL | Status: AC
  Filled 2019-08-09: qty 1

## 2019-08-09 MED ORDER — AZACITIDINE CHEMO SQ INJECTION
75.0000 mg/m2 | Freq: Once | INTRAMUSCULAR | Status: AC
  Administered 2019-08-09: 165 mg via SUBCUTANEOUS
  Filled 2019-08-09: qty 6.6

## 2019-08-09 NOTE — Patient Instructions (Signed)
[facility] Discharge Instructions for Patients Receiving Chemotherapy  Today you received the following chemotherapy agents Vidaza  To help prevent nausea and vomiting after your treatment, we encourage you to take your nausea medication as directed  If you develop nausea and vomiting that is not controlled by your nausea medication, call the clinic.   BELOW ARE SYMPTOMS THAT SHOULD BE REPORTED IMMEDIATELY:  *FEVER GREATER THAN 100.5 F  *CHILLS WITH OR WITHOUT FEVER  NAUSEA AND VOMITING THAT IS NOT CONTROLLED WITH YOUR NAUSEA MEDICATION  *UNUSUAL SHORTNESS OF BREATH  *UNUSUAL BRUISING OR BLEEDING  TENDERNESS IN MOUTH AND THROAT WITH OR WITHOUT PRESENCE OF ULCERS  *URINARY PROBLEMS  *BOWEL PROBLEMS  UNUSUAL RASH Items with * indicate a potential emergency and should be followed up as soon as possible.  Feel free to call the clinic should you have any questions or concerns. The clinic phone number is [phone].  Please show the CHEMO ALERT CARD at check-in to the Emergency Department and triage nurse.   

## 2019-08-09 NOTE — Progress Notes (Signed)
Per Dr. Jana Hakim, okay to treat without BMP today. Per Dr. Jana Hakim patient is to be scheduled for BMP on Monday, 08/12/2019.

## 2019-08-12 ENCOUNTER — Other Ambulatory Visit: Payer: Self-pay

## 2019-08-12 ENCOUNTER — Ambulatory Visit: Payer: Medicare Other

## 2019-08-12 ENCOUNTER — Inpatient Hospital Stay: Payer: Medicare Other

## 2019-08-12 ENCOUNTER — Other Ambulatory Visit: Payer: Self-pay | Admitting: *Deleted

## 2019-08-12 VITALS — BP 145/62 | HR 70 | Temp 98.9°F | Resp 17 | Ht 72.0 in | Wt 201.5 lb

## 2019-08-12 DIAGNOSIS — Z940 Kidney transplant status: Secondary | ICD-10-CM

## 2019-08-12 DIAGNOSIS — I158 Other secondary hypertension: Secondary | ICD-10-CM

## 2019-08-12 DIAGNOSIS — D696 Thrombocytopenia, unspecified: Secondary | ICD-10-CM

## 2019-08-12 DIAGNOSIS — Z5111 Encounter for antineoplastic chemotherapy: Secondary | ICD-10-CM | POA: Diagnosis not present

## 2019-08-12 DIAGNOSIS — D61818 Other pancytopenia: Secondary | ICD-10-CM

## 2019-08-12 DIAGNOSIS — D649 Anemia, unspecified: Secondary | ICD-10-CM

## 2019-08-12 DIAGNOSIS — D469 Myelodysplastic syndrome, unspecified: Secondary | ICD-10-CM

## 2019-08-12 DIAGNOSIS — E113513 Type 2 diabetes mellitus with proliferative diabetic retinopathy with macular edema, bilateral: Secondary | ICD-10-CM

## 2019-08-12 LAB — COMPREHENSIVE METABOLIC PANEL
ALT: 10 U/L (ref 0–44)
AST: 18 U/L (ref 15–41)
Albumin: 3.7 g/dL (ref 3.5–5.0)
Alkaline Phosphatase: 94 U/L (ref 38–126)
Anion gap: 10 (ref 5–15)
BUN: 13 mg/dL (ref 8–23)
CO2: 29 mmol/L (ref 22–32)
Calcium: 10.4 mg/dL — ABNORMAL HIGH (ref 8.9–10.3)
Chloride: 101 mmol/L (ref 98–111)
Creatinine, Ser: 1.42 mg/dL — ABNORMAL HIGH (ref 0.61–1.24)
GFR calc Af Amer: 57 mL/min — ABNORMAL LOW (ref >60)
GFR calc non Af Amer: 49 mL/min — ABNORMAL LOW (ref >60)
Glucose, Bld: 160 mg/dL — ABNORMAL HIGH (ref 70–99)
Potassium: 3.4 mmol/L — ABNORMAL LOW (ref 3.5–5.1)
Sodium: 140 mmol/L (ref 135–145)
Total Bilirubin: 1.4 mg/dL — ABNORMAL HIGH (ref 0.3–1.2)
Total Protein: 7.1 g/dL (ref 6.5–8.1)

## 2019-08-12 LAB — CBC WITH DIFFERENTIAL/PLATELET
Abs Immature Granulocytes: 2.04 10*3/uL — ABNORMAL HIGH (ref 0.00–0.07)
Basophils Absolute: 0 10*3/uL (ref 0.0–0.1)
Basophils Relative: 0 %
Eosinophils Absolute: 0 10*3/uL (ref 0.0–0.5)
Eosinophils Relative: 0 %
HCT: 23.2 % — ABNORMAL LOW (ref 39.0–52.0)
Hemoglobin: 7.2 g/dL — ABNORMAL LOW (ref 13.0–17.0)
Immature Granulocytes: 27 %
Lymphocytes Relative: 19 %
Lymphs Abs: 1.4 10*3/uL (ref 0.7–4.0)
MCH: 32.3 pg (ref 26.0–34.0)
MCHC: 31 g/dL (ref 30.0–36.0)
MCV: 104 fL — ABNORMAL HIGH (ref 80.0–100.0)
Monocytes Absolute: 1.4 10*3/uL — ABNORMAL HIGH (ref 0.1–1.0)
Monocytes Relative: 18 %
Neutro Abs: 2.6 10*3/uL (ref 1.7–7.7)
Neutrophils Relative %: 36 %
Platelets: 54 10*3/uL — ABNORMAL LOW (ref 150–400)
RBC: 2.23 MIL/uL — ABNORMAL LOW (ref 4.22–5.81)
RDW: 27 % — ABNORMAL HIGH (ref 11.5–15.5)
WBC: 7.5 10*3/uL (ref 4.0–10.5)
nRBC: 2 % — ABNORMAL HIGH (ref 0.0–0.2)

## 2019-08-12 LAB — LACTATE DEHYDROGENASE: LDH: 314 U/L — ABNORMAL HIGH (ref 98–192)

## 2019-08-12 LAB — FERRITIN: Ferritin: 2473 ng/mL — ABNORMAL HIGH (ref 24–336)

## 2019-08-12 LAB — RETICULOCYTES
Immature Retic Fract: 4.4 % (ref 2.3–15.9)
RBC.: 2.2 MIL/uL — ABNORMAL LOW (ref 4.22–5.81)
Retic Count, Absolute: 262 10*3/uL — ABNORMAL HIGH (ref 19.0–186.0)
Retic Ct Pct: 11.9 % — ABNORMAL HIGH (ref 0.4–3.1)

## 2019-08-12 MED ORDER — AZACITIDINE CHEMO SQ INJECTION
75.0000 mg/m2 | Freq: Once | INTRAMUSCULAR | Status: AC
  Administered 2019-08-12: 16:00:00 165 mg via SUBCUTANEOUS
  Filled 2019-08-12: qty 6.6

## 2019-08-12 MED ORDER — ONDANSETRON HCL 8 MG PO TABS
8.0000 mg | ORAL_TABLET | Freq: Once | ORAL | Status: AC
  Administered 2019-08-12: 8 mg via ORAL

## 2019-08-12 MED ORDER — ONDANSETRON HCL 8 MG PO TABS
ORAL_TABLET | ORAL | Status: AC
  Filled 2019-08-12: qty 1

## 2019-08-12 MED ORDER — CEPHALEXIN 250 MG PO CAPS: 250.0000 mg | ORAL_CAPSULE | Freq: Two times a day (BID) | ORAL | 0 refills | Status: AC

## 2019-08-12 MED ORDER — EPOETIN ALFA-EPBX 40000 UNIT/ML IJ SOLN
40000.0000 [IU] | Freq: Once | INTRAMUSCULAR | Status: AC
  Administered 2019-08-12: 40000 [IU] via SUBCUTANEOUS
  Filled 2019-08-12: qty 1

## 2019-08-12 NOTE — Progress Notes (Signed)
Patient stated he had what felt like a "swollen lymph node under my right armpit". Observed area and it appeared to be an ingrown hair with an over productive sebaceous gland. Patient denied any pain or discomfort to site.  Instructed patient to use a warm compress on the area for 10-15 min at least 3 times a day. Patient verbalized understanding and agreement. Will route message to Dr. Jana Hakim and desk RN to make them aware as well.  Patient made aware that Keflex was being sent to his pharmacy on file to take prophylacticly and prevent area under arm from getting infected. Patient verbalized understanding and agreement.

## 2019-08-12 NOTE — Patient Instructions (Addendum)
**Pick up antibiotic from pharmacy and start tonight. Take twice a day (every 12 hours) until prescription complete**  [facility] Discharge Instructions for Patients Receiving Chemotherapy  Today you received the following chemotherapy agents: Azacitidine (Vidaza)  To help prevent nausea and vomiting after your treatment, we encourage you to take your nausea medication as directed.   If you develop nausea and vomiting that is not controlled by your nausea medication, call the clinic.   BELOW ARE SYMPTOMS THAT SHOULD BE REPORTED IMMEDIATELY:  *FEVER GREATER THAN 100.5 F  *CHILLS WITH OR WITHOUT FEVER  NAUSEA AND VOMITING THAT IS NOT CONTROLLED WITH YOUR NAUSEA MEDICATION  *UNUSUAL SHORTNESS OF BREATH  *UNUSUAL BRUISING OR BLEEDING  TENDERNESS IN MOUTH AND THROAT WITH OR WITHOUT PRESENCE OF ULCERS  *URINARY PROBLEMS  *BOWEL PROBLEMS  UNUSUAL RASH Items with * indicate a potential emergency and should be followed up as soon as possible.  Feel free to call the clinic should you have any questions or concerns. The clinic phone number is [phone].  Please show the [facility] at check-in to the Emergency Department and triage nurse.  Epoetin Alfa injection What is this medicine? EPOETIN ALFA (e POE e tin AL fa) helps your body make more red blood cells. This medicine is used to treat anemia caused by chronic kidney disease, cancer chemotherapy, or HIV-therapy. It may also be used before surgery if you have anemia. This medicine may be used for other purposes; ask your health care provider or pharmacist if you have questions. COMMON BRAND NAME(S): Epogen, Procrit, Retacrit What should I tell my health care provider before I take this medicine? They need to know if you have any of these conditions:  cancer  heart disease  high blood pressure  history of blood clots  history of stroke  low levels of folate, iron, or vitamin B12 in the  blood  seizures  an unusual or allergic reaction to erythropoietin, albumin, benzyl alcohol, hamster proteins, other medicines, foods, dyes, or preservatives  pregnant or trying to get pregnant  breast-feeding How should I use this medicine? This medicine is for injection into a vein or under the skin. It is usually given by a health care professional in a hospital or clinic setting. If you get this medicine at home, you will be taught how to prepare and give this medicine. Use exactly as directed. Take your medicine at regular intervals. Do not take your medicine more often than directed. It is important that you put your used needles and syringes in a special sharps container. Do not put them in a trash can. If you do not have a sharps container, call your pharmacist or healthcare provider to get one. A special MedGuide will be given to you by the pharmacist with each prescription and refill. Be sure to read this information carefully each time. Talk to your pediatrician regarding the use of this medicine in children. While this drug may be prescribed for selected conditions, precautions do apply. Overdosage: If you think you have taken too much of this medicine contact a poison control center or emergency room at once. NOTE: This medicine is only for you. Do not share this medicine with others. What if I miss a dose? If you miss a dose, take it as soon as you can. If it is almost time for your next dose, take only that dose. Do not take double or extra doses. What may interact with this medicine? Interactions have not  been studied. This list may not describe all possible interactions. Give your health care provider a list of all the medicines, herbs, non-prescription drugs, or dietary supplements you use. Also tell them if you smoke, drink alcohol, or use illegal drugs. Some items may interact with your medicine. What should I watch for while using this medicine? Your condition will be  monitored carefully while you are receiving this medicine. You may need blood work done while you are taking this medicine. This medicine may cause a decrease in vitamin B6. You should make sure that you get enough vitamin B6 while you are taking this medicine. Discuss the foods you eat and the vitamins you take with your health care professional. What side effects may I notice from receiving this medicine? Side effects that you should report to your doctor or health care professional as soon as possible:  allergic reactions like skin rash, itching or hives, swelling of the face, lips, or tongue  seizures  signs and symptoms of a blood clot such as breathing problems; changes in vision; chest pain; severe, sudden headache; pain, swelling, warmth in the leg; trouble speaking; sudden numbness or weakness of the face, arm or leg  signs and symptoms of a stroke like changes in vision; confusion; trouble speaking or understanding; severe headaches; sudden numbness or weakness of the face, arm or leg; trouble walking; dizziness; loss of balance or coordination Side effects that usually do not require medical attention (report to your doctor or health care professional if they continue or are bothersome):  chills  cough  dizziness  fever  headaches  joint pain  muscle cramps  muscle pain  nausea, vomiting  pain, redness, or irritation at site where injected This list may not describe all possible side effects. Call your doctor for medical advice about side effects. You may report side effects to FDA at 1-800-FDA-1088. Where should I keep my medicine? Keep out of the reach of children. Store in a refrigerator between 2 and 8 degrees C (36 and 46 degrees F). Do not freeze or shake. Throw away any unused portion if using a single-dose vial. Multi-dose vials can be kept in the refrigerator for up to 21 days after the initial dose. Throw away unused medicine. NOTE: This sheet is a summary.  It may not cover all possible information. If you have questions about this medicine, talk to your doctor, pharmacist, or health care provider.  2020 Elsevier/Gold Standard (2017-05-12 08:35:19)  Coronavirus (COVID-19) Are you at risk?  Are you at risk for the Coronavirus (COVID-19)?  To be considered HIGH RISK for Coronavirus (COVID-19), you have to meet the following criteria:  . Traveled to [place], [place], [place], [place] or [place]; or in the [place] to [place], [place], [place], or [place]; and have fever, cough, and shortness of breath within the last 2 weeks of travel OR . Been in close contact with a person diagnosed with COVID-19 within the last 2 weeks and have fever, cough, and shortness of breath . IF YOU DO NOT MEET THESE CRITERIA, YOU ARE CONSIDERED LOW RISK FOR COVID-19.  What to do if you are HIGH RISK for COVID-19?  [name] If you are having a medical emergency, call 911. . Seek medical care right away. Before you go to a doctor's office, urgent care or emergency department, call ahead and tell them about your recent travel, contact with someone diagnosed with COVID-19, and your symptoms. You should receive instructions from your physician's office  regarding next steps of care.  . When you arrive at healthcare provider, tell the healthcare staff immediately you have returned from visiting [place], [place], [place], [place] or [place]; or traveled in the [place] to [place], [place], [place], or [place]; in the last two weeks or you have been in close contact with a person diagnosed with COVID-19 in the last 2 weeks.   . Tell the health care staff about your symptoms: fever, cough and shortness of breath. . After you have been seen by a medical provider, you will be either: o Tested for (COVID-19) and discharged home on quarantine except to seek medical care if symptoms worsen, and asked to  - Stay home and avoid contact with others until you get your results  (4-5 days)  - Avoid travel on public transportation if possible (such as bus, train, or airplane) or o Sent to the Emergency Department by EMS for evaluation, COVID-19 testing, and possible admission depending on your condition and test results.  What to do if you are LOW RISK for COVID-19?  Reduce your risk of any infection by using the same precautions used for avoiding the common cold or flu:  [name] Wash your hands often with soap and warm water for at least 20 seconds.  If soap and water are not readily available, use an alcohol-based hand sanitizer with at least 60% alcohol.  . If coughing or sneezing, cover your mouth and nose by coughing or sneezing into the elbow areas of your shirt or coat, into a tissue or into your sleeve (not your hands). . Avoid shaking hands with others and consider head nods or verbal greetings only. . Avoid touching your eyes, nose, or mouth with unwashed hands.  . Avoid close contact with people who are sick. . Avoid places or events with large numbers of people in one location, like concerts or sporting events. . Carefully consider travel plans you have or are making. . If you are planning any travel outside or inside the [place], visit the CDC's Travelers' Health webpage for the latest health notices. . If you have some symptoms but not all symptoms, continue to monitor at home and seek medical attention if your symptoms worsen. . If you are having a medical emergency, call 911.   [facility] / e-Visit: [email or web address]         [facility]: [facility]: [phone]                   [facility]: [phone]

## 2019-08-13 ENCOUNTER — Other Ambulatory Visit: Payer: Self-pay

## 2019-08-13 ENCOUNTER — Inpatient Hospital Stay: Payer: Medicare Other

## 2019-08-13 VITALS — BP 139/49 | HR 66 | Temp 98.4°F | Resp 18

## 2019-08-13 DIAGNOSIS — D469 Myelodysplastic syndrome, unspecified: Secondary | ICD-10-CM

## 2019-08-13 DIAGNOSIS — D696 Thrombocytopenia, unspecified: Secondary | ICD-10-CM

## 2019-08-13 DIAGNOSIS — Z5111 Encounter for antineoplastic chemotherapy: Secondary | ICD-10-CM | POA: Diagnosis not present

## 2019-08-13 MED ORDER — ONDANSETRON HCL 8 MG PO TABS
ORAL_TABLET | ORAL | Status: AC
  Filled 2019-08-13: qty 1

## 2019-08-13 MED ORDER — AZACITIDINE CHEMO SQ INJECTION
75.0000 mg/m2 | Freq: Once | INTRAMUSCULAR | Status: AC
  Administered 2019-08-13: 165 mg via SUBCUTANEOUS
  Filled 2019-08-13: qty 6.6

## 2019-08-13 MED ORDER — ONDANSETRON HCL 8 MG PO TABS
8.0000 mg | ORAL_TABLET | Freq: Once | ORAL | Status: AC
  Administered 2019-08-13: 14:00:00 8 mg via ORAL

## 2019-08-13 NOTE — Patient Instructions (Signed)
[facility] Discharge Instructions for Patients Receiving Chemotherapy  Today you received the following chemotherapy agents:  Azacitidine  To help prevent nausea and vomiting after your treatment, we encourage you to take your nausea medication as prescribed.   If you develop nausea and vomiting that is not controlled by your nausea medication, call the clinic.   BELOW ARE SYMPTOMS THAT SHOULD BE REPORTED IMMEDIATELY:  *FEVER GREATER THAN 100.5 F  *CHILLS WITH OR WITHOUT FEVER  NAUSEA AND VOMITING THAT IS NOT CONTROLLED WITH YOUR NAUSEA MEDICATION  *UNUSUAL SHORTNESS OF BREATH  *UNUSUAL BRUISING OR BLEEDING  TENDERNESS IN MOUTH AND THROAT WITH OR WITHOUT PRESENCE OF ULCERS  *URINARY PROBLEMS  *BOWEL PROBLEMS  UNUSUAL RASH Items with * indicate a potential emergency and should be followed up as soon as possible.  Feel free to call the clinic should you have any questions or concerns. The clinic phone number is [phone].  Please show the CHEMO ALERT CARD at check-in to the Emergency Department and triage nurse.   

## 2019-08-18 NOTE — Progress Notes (Signed)
°[facility]  °[phone] [phone]  ° ° °ID: Dhanush Lee Hafner DOB: 03/02/1946  MR#: [id number]  CSN#:[id number] ° °Patient Care Team: °Dean, Eric L, MD as PCP - General (Internal Medicine) °,  C, MD as Consulting Physician (Oncology) °Coladonato, Joseph, MD as Consulting Physician (Nephrology) °Moya, Frank Joseph, MD as Referring Physician (Ophthalmology) °Dew, Jason S, MD as Referring Physician (Vascular Surgery) °OTHER MD: ° °CHIEF COMPLAINT: MDS ° °CURRENT TREATMENT: Retacrit, azacitidine ° ° °INTERVAL HISTORY: °Ender returns today for follow-up and treatment of MDS.  ° °He continues on Retacrit. We are following his hemoglobin, reticulocytes and RBC transfusions: °Results for Ruegg, Garrette LEE (MRN [id number]) as of 08/18/2019 17:08 ° Ref. Range 07/15/2019 13:14 07/17/2019 09:55 07/22/2019 13:54 07/29/2019 13:52 08/05/2019 12:09 08/06/2019 14:21 08/07/2019 13:07 08/12/2019 13:39  °Hemoglobin Latest Ref Range: 13.0 - 17.0 g/dL 7.1 (L) 7.0 (L) 7.1 (L) 7.4 (L) 7.8 (L) 8.4 (L) 7.7 (L) 7.2 (L)  °Results for Trueheart, Andon LEE (MRN [id number]) as of 08/18/2019 17:08 ° Ref. Range 06/26/2019 13:18 07/08/2019 13:58 07/22/2019 13:54 08/05/2019 12:10 08/12/2019 13:40  °Retic Count, Absolute Latest Ref Range: 19.0 - 186.0 K/uL 34.6 52.9 77.5 252.5 (H) 262.0 (H)  ° ° °He also continues on azacytidine.   Today is day 15 cycle 2. ° °Since his last visit, he has not undergone any additional studies. ° ° °REVIEW OF SYSTEMS: °Sahil has tolerated the is a cytidine well so far.  He tells me after day 7 with the second cycle he felt a little bit of nausea and actually vomited x1.  He has had no other obvious side effects from the treatment that he knows of.  Today he tells me he feels "okay", and is doing his normal activities.  He denies dizziness, chest pain or pressure, or presyncopal symptoms.  He and his wife are being very careful regarding the pandemic.  A detailed review of  systems today was otherwise stable. ° ° °HISTORY OF CURRENT ILLNESS: °From the original intake note: ° °Oak Lee Holstein is a 73-year-old [place] man with a history of type 2 diabetes complicated by significant retinopathy and end-stage renal disease, status post renal transplant, followed by Dr. Coladonato.  Mcclintic notes that he was feeling more fatigued than usual, and that last time he felt this way, his Vitamin D levels were low. He called his doctors office to ask for his blood work to be ran before he was sent to the ED by Dr. Dean. The patient presented to the emergency room 03/22/2019 with symptomatic anemia, was found to have hemoglobin of 5.9 and pancytopenia.  Additional work-up is listed below in the assessment section, but there was no evidence of B12, folate or iron deficiency and reticulocyte count was inappropriately normal. ° °HIV and SARS coronavirus 2 tests were negative.  Creatinine was 1.49 with GFR was 54.  Glucose was 221.  Other labs of interest obtained 03/23/2019 included a B12 level of 944, folate 13.2, ferritin 1060, with serum iron 134, saturation 41%.  The absolute reticulocyte count was 64.5. ° °His end-stage renal disease is felt to be due to diabetes and hypertension.  He was on dialysis starting 2010 but on 03/31/2015 he underwent a cadaveric renal transplant at [facility].  He had transient CMV viremia, which was treated.  He continues on Bactrim prophylaxis and tacrolimus.  He also receives mycophenolate and when he had the episode of severe anemia and a mycophenolate was decreased in half and his Bactrim   was decreased to Monday Wednesday Friday.  His tacrolimus level was on the high side.  This has been since adjusted by Dr. Coladonato and is being repeated today ° °The patient was transfused and discharged and referred here for evaluation of pancytopenia ° ° °PAST MEDICAL HISTORY: °Past Medical History:  °Diagnosis Date  °• Anemia   ° hx of at beginnning of  dialysis- 05/2009  °• Arthritis   ° right hip   °• End stage renal disease (HCC)   ° [facility]   °• End stage renal disease on dialysis (HCC)   °• Essential hypertension, malignant   °• Hypertrophy of prostate without urinary obstruction and other lower urinary tract symptoms (LUTS)   °• Myelodysplasia (myelodysplastic syndrome) (HCC) 05/20/2019  °• Pure hypercholesterolemia   °• Renal insufficiency   °• Type II or unspecified type diabetes mellitus without mention of complication, not stated as uncontrolled   ° TYPE 2   ° ° ° °PAST SURGICAL HISTORY: °Past Surgical History:  °Procedure Laterality Date  °• A/V FISTULAGRAM Left 06/12/2017  ° Procedure: A/V Fistulagram;  Surgeon: Dew, Jason S, MD;  Location: [facility] INVASIVE CV LAB;  Service: Cardiovascular;  Laterality: Left;  °• CARDIAC CATHETERIZATION  04/29/10  ° normal coronaries, LVEF 45%, mild global LV hypokinesis ([place])  °• CATARACT EXTRACTION W/PHACO Right 04/03/2013  ° Procedure: CATARACT EXTRACTION PHACO AND INTRAOCULAR LENS PLACEMENT (IOC);  Surgeon: Greer Geiger, MD;  Location: [facility];  Service: Ophthalmology;  Laterality: Right;  °• CERVICAL DISCECTOMY  03/1999  °• COLONOSCOPY    ° Hx: of  °• CYSTOSCOPY WITH BIOPSY  11/01/2012  ° Procedure: CYSTOSCOPY WITH BIOPSY;  Surgeon: Daniel Young Woodruff, MD;  Location: [facility] ORS;  Service: Urology;  Laterality: N/A;  with fulgeration  °• CYSTOSCOPY WITH RETROGRADE PYELOGRAM, URETEROSCOPY AND STENT PLACEMENT  11/01/2012  ° Procedure: CYSTOSCOPY WITH RETROGRADE PYELOGRAM, URETEROSCOPY AND STENT PLACEMENT;  Surgeon: Daniel Young Woodruff, MD;  Location: [facility];  Service: Urology;  Laterality: Bilateral;  °• DIALYSIS FISTULA CREATION  2010  °• EYE SURGERY  2012  ° cataracts  °• EYE SURGERY  11/2014  °• HOLMIUM LASER APPLICATION  11/01/2012  ° Procedure: HOLMIUM LASER APPLICATION;  Surgeon: Daniel Young Woodruff, MD;  Location: [facility];  Service: Urology;  Laterality: Bilateral;  °• KIDNEY  TRANSPLANT  2016  °• MEMBRANE PEEL Right 09/04/2013  ° Procedure: MEMBRANE PEEL;  Surgeon: Greer Geiger, MD;  Location: [facility];  Service: Ophthalmology;  Laterality: Right;  °• NECK SURGERY  1990  °• PARS PLANA VITRECTOMY Right 09/04/2013  ° Procedure: PARS PLANA VITRECTOMY WITH 23 GAUGE;  Surgeon: Greer Geiger, MD;  Location: [facility];  Service: Ophthalmology;  Laterality: Right;  °• PARS PLANA VITRECTOMY Left 02/17/2015  °• PARS PLANA VITRECTOMY Left 02/17/2015  ° Procedure: PARS PLANA VITRECTOMY WITH 25G REMOVAL/SUTURE INTRAOCULAR LENS;  Surgeon: John D Matthews, MD;  Location: [facility];  Service: Ophthalmology;  Laterality: Left;  °• PERIPHERAL VASCULAR CATHETERIZATION Left 08/08/2016  ° Procedure: A/V Shuntogram/Fistulagram;  Surgeon: Jason S Dew, MD;  Location: [facility] INVASIVE CV LAB;  Service: Cardiovascular;  Laterality: Left;  °• PERIPHERAL VASCULAR CATHETERIZATION N/A 08/08/2016  ° Procedure: A/V Shunt Intervention;  Surgeon: Jason S Dew, MD;  Location: [facility] CV [facility];  Service: Cardiovascular;  Laterality: N/A;  °• PHOTOCOAGULATION WITH LASER Right 09/04/2013  ° Procedure: PHOTOCOAGULATION WITH LASER;  Surgeon: Greer Geiger, MD;  [facility];  Service: Ophthalmology;  Laterality: Right;  ENDOLASER  °•   TRANSTHORACIC ECHOCARDIOGRAM  06/19/2012  ° normal LV sys function, EF 55-60%, mild to mod diastolic dysfunction, mild AI ([place])  °tonsillectomy @ 73 years of age ° ° °FAMILY HISTORY: °Family History  °Problem Relation Age of Onset  °• Breast cancer Mother 41  °     deceased  °• Pneumonia Father 38  °     deceased  °• Hypertension Brother   °• Diabetes Mellitus II Brother   °• Cancer Sister   °     Intestinal  ° °The patient's s father died from pneumonia at age 38.  He was originally a farmer and later worked in a fertilizer plant.  Patients' mother died from breast cancer at age 41. The patient has 7 siblings. Patient denies anyone in her family having breast, ovarian, prostate, or pancreatic  cancer.  1 sister died from intestinal cancer.  Another sister, Sandra, died from an aneurism.   ° ° °SOCIAL HISTORY: (Current as of 04/22/2019) °Mr. Hasten retired from the [employer]. He was in the air [employer] and went to [place]. He notes that he was exposed to agent orange. His wife, Ojetter, is a retired LPN. They have two children, Kevin and Lauren. Kevin lives in [place] and works at the [employer] plant for [employer]. 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 Jaiveon has two grandchildren.  His daughter is expecting in August 2020. The patient he belongs to [employer] on [address]  ° ° ADVANCED DIRECTIVES: In the absence of any documentation, Hilman's spouse is automatically her healthcare power of attorney.   ° ° °HEALTH MAINTENANCE: °Social History  ° °Tobacco Use  °• Smoking status: Former Smoker  °  Types: Cigarettes  °  Quit date: 10/02/1990  °  Years since quitting: 28.8  °• Smokeless tobacco: Never Used  °Substance Use Topics  °• Alcohol use: Yes  °  Comment: occasional  °• Drug use: No  ° ° Colonoscopy: up to date/ [facility] ° °Allergies  °Allergen Reactions  °• Codeine Nausea And Vomiting and Nausea Only  °• Doxycycline Nausea And Vomiting and Nausea Only  °• Latex Hives and Rash  °• Tape Other (See Comments)  °  The "plastic, waffle-printed" tape tears and bruises the skin  ° ° °Current Outpatient Medications  °Medication Sig Dispense Refill  °• acetaminophen (TYLENOL) 325 MG tablet Take 650 mg by mouth every 6 (six) hours as needed (for pain or headaches).     °• allopurinol (ZYLOPRIM) 300 MG tablet Take 300 mg by mouth as needed (as directed for gout flares).     °• amLODipine (NORVASC) 5 MG tablet Take 1 tablet (5 mg total) by mouth daily. 30 tablet 0  °• betamethasone dipropionate (DIPROLENE) 0.05 % ointment     °• cephALEXin (KEFLEX) 250 MG capsule Take 1 capsule (250 mg total) by mouth 2 (two) times daily. 12 capsule 0    °• cloNIDine (CATAPRES) 0.1 MG tablet Take 0.1 mg by mouth 3 (three) times daily.    °• donepezil (ARICEPT) 10 MG tablet Take 10 mg by mouth at bedtime.    °• dorzolamide (TRUSOPT) 2 % ophthalmic solution Place 1 drop into both eyes 2 (two) times a day.     °• doxazosin (CARDURA) 4 MG tablet Take 4 mg by mouth 2 (two) times daily.    °• furosemide (LASIX) 40 MG tablet Take 40-80 mg by mouth See admin instructions. Take 80 mg by mouth   in the morning and 40 mg in the evening    °• HUMALOG KWIKPEN 100 UNIT/ML KiwkPen Inject 8-16 Units into the skin See admin instructions. Inject 8-16 units into the skin three times a day before meals, PER SLIDING SCALE (BGL 150 or less, use nothing)  3  °• Insulin Glargine (LANTUS SOLOSTAR) 100 UNIT/ML Solostar Pen Inject 34 Units into the skin daily before breakfast.     °• K Phos Mono-Sod Phos Di & Mono (PHOSPHA 250 NEUTRAL) 155-852-130 MG TABS Take 1-2 tablets by mouth See admin instructions. Take 2 tablets by mouth in the morning and 1 tablet at bedtime at 8 PM    °• ketoconazole (NIZORAL) 2 % shampoo Apply 1 application topically every 7 (seven) days.   2  °• KLOR-CON M20 20 MEQ tablet Take 20 mEq by mouth daily.  1  °• latanoprost (XALATAN) 0.005 % ophthalmic solution Place 1 drop into both eyes at bedtime.     °• linagliptin (TRADJENTA) 5 MG TABS tablet Take 5 mg by mouth daily with breakfast.     °• losartan (COZAAR) 100 MG tablet Take 100 mg by mouth daily.    °• metoprolol tartrate (LOPRESSOR) 25 MG tablet Take 25 mg by mouth 2 (two) times daily.    °• Multiple Vitamins-Minerals (ONE-A-DAY MENS 50+ ADVANTAGE) TABS Take 1 tablet by mouth daily with breakfast.    °• mupirocin cream (BACTROBAN) 2 % Apply 1 application topically 2 (two) times daily. 15 g 0  °• mycophenolate (MYFORTIC) 180 MG EC tablet Take 1 tablet (180 mg total) by mouth 2 (two) times daily. 60 tablet 0  °• NON FORMULARY Apply 1 application topically See admin instructions. Skin-So-Soft lotion: Apply as needed  to dry sites of skin    °• ondansetron (ZOFRAN) 8 MG tablet Take 1 tablet (8 mg total) by mouth 2 (two) times daily as needed (Nausea or vomiting). 30 tablet 1  °• sodium chloride (OCEAN) 0.65 % SOLN nasal spray Place 2 sprays into both nostrils as needed for congestion.     °• sulfamethoxazole-trimethoprim (BACTRIM) 400-80 MG tablet Take 1 tablet by mouth every Monday, Wednesday, and Friday.    °• tacrolimus (PROGRAF) 0.5 MG capsule Take 0.5 mg by mouth 2 (two) times daily.    °• tacrolimus (PROGRAF) 1 MG capsule Take 3 mg by mouth 2 (two) times daily.   11  °• tadalafil (CIALIS) 20 MG tablet Take 20 mg by mouth daily as needed for erectile dysfunction.     °• VIGAMOX 0.5 % ophthalmic solution INSTILL 1 DROP INTO RIGHT EYE 4 TIMES A DAY    °• Vitamin D, Ergocalciferol, (DRISDOL) 50000 units CAPS capsule Take 50,000 Units by mouth every 7 (seven) days.     ° °No current facility-administered medications for this visit.   ° ° ° °OBJECTIVE: Older African-American man who appears stated age ° °Vitals:  ° 08/19/19 1242  °BP: (!) 130/59  °Pulse: 71  °Resp: 18  °Temp: 98.7 °F (37.1 °C)  °SpO2: 100%  ° °Wt Readings from Last 3 Encounters:  °08/19/19 201 lb 3.2 oz (91.3 kg)  °08/12/19 201 lb 8 oz (91.4 kg)  °08/05/19 201 lb 6.4 oz (91.4 kg)  ° °Body mass index is 27.29 kg/m².   ° °ECOG FS:2 - Symptomatic, <50% confined to bed ° °Sclerae unicteric, EOMs intact °Wearing a mask °No cervical or supraclavicular adenopathy °Lungs no rales or rhonchi °Heart regular rate and rhythm °Abd soft, nontender, positive bowel sounds, no palpable splenomegaly °  MSK no focal spinal tenderness, no upper extremity lymphedema °Neuro: nonfocal, well oriented, appropriate affect ° ° ° °LAB RESULTS: ° ° °CMP  °   °Component Value Date/Time  ° NA 140 08/12/2019 1339  ° NA 135 (L) 07/04/2013 1256  ° K 3.4 (L) 08/12/2019 1339  ° K 4.4 07/11/2013 1108  ° CL 101 08/12/2019 1339  ° CL 96 (L) 07/04/2013 1256  ° CO2 29 08/12/2019 1339  ° CO2 32 07/04/2013  1256  ° GLUCOSE 160 (H) 08/12/2019 1339  ° GLUCOSE 189 (H) 07/04/2013 1256  ° BUN 13 08/12/2019 1339  ° BUN 26 (H) 07/04/2013 1256  ° CREATININE 1.42 (H) 08/12/2019 1339  ° CREATININE 1.35 (H) 04/26/2019 1455  ° CREATININE 10.86 (H) 07/04/2013 1256  ° CALCIUM 10.4 (H) 08/12/2019 1339  ° CALCIUM 8.8 07/04/2013 1256  ° PROT 7.1 08/12/2019 1339  ° ALBUMIN 3.7 08/12/2019 1339  ° AST 18 08/12/2019 1339  ° AST 23 04/26/2019 1455  ° ALT 10 08/12/2019 1339  ° ALT 19 04/26/2019 1455  ° ALKPHOS 94 08/12/2019 1339  ° BILITOT 1.4 (H) 08/12/2019 1339  ° BILITOT 0.9 04/26/2019 1455  ° GFRNONAA 49 (L) 08/12/2019 1339  ° GFRNONAA 53 (L) 04/26/2019 1455  ° GFRNONAA 4 (L) 07/04/2013 1256  ° GFRAA 57 (L) 08/12/2019 1339  ° GFRAA >60 04/26/2019 1455  ° GFRAA 5 (L) 07/04/2013 1256  ° ° °No results found for: TOTALPROTELP, ALBUMINELP, A1GS, A2GS, BETS, BETA2SER, GAMS, MSPIKE, SPEI ° °No results found for: KPAFRELGTCHN, LAMBDASER, KAPLAMBRATIO ° °Lab Results  °Component Value Date  ° WBC 4.3 08/19/2019  ° NEUTROABS 1.7 08/19/2019  ° HGB 5.8 (LL) 08/19/2019  ° HCT 18.6 (L) 08/19/2019  ° MCV 104.5 (H) 08/19/2019  ° PLT 21 (L) 08/19/2019  ° ° °@LASTCHEMISTRY@ ° °No results found for: LABCA2 ° °No components found for: LABCAN125 ° °No results for input(s): INR in the last 168 hours. ° °No results found for: LABCA2 ° °No results found for: CAN199 ° °No results found for: CAN125 ° °No results found for: CAN153 ° °No results found for: CA2729 ° °No components found for: HGQUANT ° °No results found for: CEA1 / No results found for: CEA1  ° °No results found for: AFPTUMOR ° °No results found for: CHROMOGRNA ° °No results found for: PSA1 ° °Appointment on 08/19/2019  °Component Date Value Ref Range Status  °• WBC 08/19/2019 4.3  4.0 - 10.5 K/uL Final  °• RBC 08/19/2019 1.78* 4.22 - 5.81 MIL/uL Final  °• Hemoglobin 08/19/2019 5.8* 13.0 - 17.0 g/dL Final  ° Comment: This critical result has verified and been called to RN VAL DODD by Kushani  Udugampola on 11 02 2020 at 1253, and has been read back.  °This critical result has verified and been called to RN VAL DODD by Kushani Udugampola on 11 02 2020 at 1254, and has been read back.  °  °• HCT 08/19/2019 18.6* 39.0 - 52.0 % Final  °• MCV 08/19/2019 104.5* 80.0 - 100.0 fL Final  °• MCH 08/19/2019 32.6  26.0 - 34.0 pg Final  °• MCHC 08/19/2019 31.2  30.0 - 36.0 g/dL Final  °• RDW 08/19/2019 26.6* 11.5 - 15.5 % Final  °• Platelets 08/19/2019 21* 150 - 400 K/uL Final  ° Comment: Immature Platelet Fraction may be °clinically indicated, consider °ordering this additional test °[id number] °  °• nRBC 08/19/2019 2.1* 0.0 - 0.2 % Final  °• Neutrophils Relative % 08/19/2019 38  % Final  °•   Neutro Abs 08/19/2019 1.7  1.7 - 7.7 K/uL Final   Lymphocytes Relative 08/19/2019 27  % Final   Lymphs Abs 08/19/2019 1.2  0.7 - 4.0 K/uL Final   Monocytes Relative 08/19/2019 13  % Final   Monocytes Absolute 08/19/2019 0.6  0.1 - 1.0 K/uL Final   Eosinophils Relative 08/19/2019 0  % Final   Eosinophils Absolute 08/19/2019 0.0  0.0 - 0.5 K/uL Final   Basophils Relative 08/19/2019 0  % Final   Basophils Absolute 08/19/2019 0.0  0.0 - 0.1 K/uL Final   WBC Morphology 08/19/2019 VARIANT LYMPHS   Final   Immature Granulocytes 08/19/2019 22  % Final   metas,myelocytes, and promyelocytes   Abs Immature Granulocytes 08/19/2019 0.96* 0.00 - 0.07 K/uL Final   Ovalocytes 08/19/2019 PRESENT   Final   Performed at [facility], [place] [address], [place], [place] [address]    (this displays the last labs from the last 3 days)  No results found for: TOTALPROTELP, ALBUMINELP, A1GS, A2GS, BETS, BETA2SER, GAMS, MSPIKE, SPEI (this displays SPEP labs)  No results found for: KPAFRELGTCHN, LAMBDASER, KAPLAMBRATIO (kappa/lambda light chains)  No results found for: HGBA, HGBA2QUANT, HGBFQUANT, HGBSQUAN (Hemoglobinopathy evaluation)   Lab Results  Component Value Date   LDH 314 (H) 08/12/2019      Lab Results  Component Value Date   IRON 134 03/23/2019   TIBC 326 03/23/2019   IRONPCTSAT 41 (H) 03/23/2019   (Iron and TIBC)  Lab Results  Component Value Date   FERRITIN 2,473 (H) 08/12/2019    Urinalysis    Component Value Date/Time   COLORURINE YELLOW 03/22/2019 [place] 03/22/2019 1609   LABSPEC 1.010 03/22/2019 1609   PHURINE 6.0 03/22/2019 1609   GLUCOSEU NEGATIVE 03/22/2019 [employer] 03/22/2019 [place] 03/22/2019 [place] 03/22/2019 1609   PROTEINUR NEGATIVE 03/22/2019 1609   UROBILINOGEN 0.2 08/17/2012 1120   NITRITE NEGATIVE 03/22/2019 [place] 03/22/2019 1609     STUDIES:  No results found.   ELIGIBLE FOR AVAILABLE RESEARCH PROTOCOL: no   ASSESSMENT: 73 y.o.  man with a history of end-stage renal disease secondary to hypertension and diabetes, on hemodialysis as of 2010  (a) status post renal transplant at [facility] 03/31/2015  (1) pancytopenia noted 03/22/2019 with fatigue and shortness of breath  (a) smear review confirmed pancytopenia and macrocytosis with hypogranular polys   (b) Bone Marrow Biopsy on 05/02/2019 shows hypercellular marrow with trilineal dysplasia, slightly left shifted myeloid maturation but no increase in blast, mild myelofibrosis  (c) Flow Cytometry shows no increase in CD34 positive blasts and left shifted myeloid maturation   (d) 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   (2) myelodysplasia, as per #1 above  (a) prognosis: Moderate risk  (3) retacrit started 05/22/2019 at 20K units, repeated weekly  (a) baseline epo level >500, which predicts a poor response to EPO  (b) ferritin 1060 on 03/23/2019  (c) retacrit dose increased to 40K weekly starting 06/19/2019  (4) azacytidine  started 07/08/2019, initially repeated daily x5  (a) cycle 2 started 08/05/2019, received 7 days treatment with this and subsequent cycles   PLAN: Jaelan is responding to treatment.  His reticulocyte count is now elevated.  He has not required an RBC transfusion since 07/17/2019.  I expect that  his hemoglobin today to be in the 9 range, and instead it is less than 6. ° °I reviewed his blood film which shows no schistocytes.  I am going to add a DAT and haptoglobin to his type and screen.  ° °We are going to transfuse 1 unit today. ° °He is already scheduled to come back next Monday, 11/09, for his next Retacrit dose.  I have asked him to make sure to call us with any other issues that may develop before the next visit. ° ° ° C. , MD  08/19/19 1:46 PM °Medical Oncology and Hematology °[facility] °[address] °[place], [place] [address] °Tel. [phone]    Fax. [phone] ° ° °I, Katie Daubenspeck, am acting as scribe for Dr.  C. . ° ° °I,   MD, have reviewed the above documentation for accuracy and completeness, and I agree with the above. ° ° ° ° °

## 2019-08-19 ENCOUNTER — Other Ambulatory Visit: Payer: Self-pay

## 2019-08-19 ENCOUNTER — Other Ambulatory Visit: Payer: Medicare Other

## 2019-08-19 ENCOUNTER — Inpatient Hospital Stay: Payer: Medicare Other

## 2019-08-19 ENCOUNTER — Telehealth: Payer: Self-pay | Admitting: Oncology

## 2019-08-19 ENCOUNTER — Inpatient Hospital Stay (HOSPITAL_BASED_OUTPATIENT_CLINIC_OR_DEPARTMENT_OTHER): Payer: Medicare Other | Admitting: Oncology

## 2019-08-19 ENCOUNTER — Other Ambulatory Visit: Payer: Self-pay | Admitting: *Deleted

## 2019-08-19 ENCOUNTER — Inpatient Hospital Stay: Payer: Medicare Other | Attending: Oncology

## 2019-08-19 VITALS — BP 130/59 | HR 71 | Temp 98.7°F | Resp 18 | Ht 72.0 in | Wt 201.2 lb

## 2019-08-19 DIAGNOSIS — D469 Myelodysplastic syndrome, unspecified: Secondary | ICD-10-CM

## 2019-08-19 DIAGNOSIS — N186 End stage renal disease: Secondary | ICD-10-CM | POA: Diagnosis present

## 2019-08-19 DIAGNOSIS — Z5111 Encounter for antineoplastic chemotherapy: Secondary | ICD-10-CM | POA: Insufficient documentation

## 2019-08-19 DIAGNOSIS — Z992 Dependence on renal dialysis: Secondary | ICD-10-CM | POA: Diagnosis not present

## 2019-08-19 DIAGNOSIS — D61818 Other pancytopenia: Secondary | ICD-10-CM

## 2019-08-19 DIAGNOSIS — Z940 Kidney transplant status: Secondary | ICD-10-CM

## 2019-08-19 DIAGNOSIS — D696 Thrombocytopenia, unspecified: Secondary | ICD-10-CM

## 2019-08-19 DIAGNOSIS — D649 Anemia, unspecified: Secondary | ICD-10-CM

## 2019-08-19 LAB — CBC WITH DIFFERENTIAL/PLATELET
Abs Immature Granulocytes: 0.96 10*3/uL — ABNORMAL HIGH (ref 0.00–0.07)
Basophils Absolute: 0 10*3/uL (ref 0.0–0.1)
Basophils Relative: 0 %
Eosinophils Absolute: 0 10*3/uL (ref 0.0–0.5)
Eosinophils Relative: 0 %
HCT: 18.6 % — ABNORMAL LOW (ref 39.0–52.0)
Hemoglobin: 5.8 g/dL — CL (ref 13.0–17.0)
Immature Granulocytes: 22 %
Lymphocytes Relative: 27 %
Lymphs Abs: 1.2 10*3/uL (ref 0.7–4.0)
MCH: 32.6 pg (ref 26.0–34.0)
MCHC: 31.2 g/dL (ref 30.0–36.0)
MCV: 104.5 fL — ABNORMAL HIGH (ref 80.0–100.0)
Monocytes Absolute: 0.6 10*3/uL (ref 0.1–1.0)
Monocytes Relative: 13 %
Neutro Abs: 1.7 10*3/uL (ref 1.7–7.7)
Neutrophils Relative %: 38 %
Platelets: 21 10*3/uL — ABNORMAL LOW (ref 150–400)
RBC: 1.78 MIL/uL — ABNORMAL LOW (ref 4.22–5.81)
RDW: 26.6 % — ABNORMAL HIGH (ref 11.5–15.5)
WBC: 4.3 10*3/uL (ref 4.0–10.5)
nRBC: 2.1 % — ABNORMAL HIGH (ref 0.0–0.2)

## 2019-08-19 LAB — FOLATE: Folate: 6.5 ng/mL (ref >5.9)

## 2019-08-19 LAB — VITAMIN B12: Vitamin B-12: 1262 pg/mL — ABNORMAL HIGH (ref 180–914)

## 2019-08-19 LAB — DIRECT ANTIGLOBULIN TEST (NOT AT ARMC)
DAT, IgG: NEGATIVE
DAT, complement: NEGATIVE

## 2019-08-19 LAB — PREPARE RBC (CROSSMATCH)

## 2019-08-19 MED ORDER — EPOETIN ALFA-EPBX 40000 UNIT/ML IJ SOLN
INTRAMUSCULAR | Status: AC
  Filled 2019-08-19: qty 1

## 2019-08-19 MED ORDER — DIPHENHYDRAMINE HCL 25 MG PO CAPS
25.0000 mg | ORAL_CAPSULE | Freq: Once | ORAL | Status: AC
  Administered 2019-08-19: 25 mg via ORAL

## 2019-08-19 MED ORDER — EPOETIN ALFA-EPBX 40000 UNIT/ML IJ SOLN
40000.0000 [IU] | Freq: Once | INTRAMUSCULAR | Status: AC
  Administered 2019-08-19: 40000 [IU] via SUBCUTANEOUS

## 2019-08-19 MED ORDER — ACETAMINOPHEN 325 MG PO TABS
ORAL_TABLET | ORAL | Status: AC
  Filled 2019-08-19: qty 2

## 2019-08-19 MED ORDER — SODIUM CHLORIDE 0.9% IV SOLUTION
250.0000 mL | Freq: Once | INTRAVENOUS | Status: DC
  Filled 2019-08-19: qty 250

## 2019-08-19 MED ORDER — ACETAMINOPHEN 325 MG PO TABS
650.0000 mg | ORAL_TABLET | Freq: Once | ORAL | Status: AC
  Administered 2019-08-19: 650 mg via ORAL

## 2019-08-19 MED ORDER — DIPHENHYDRAMINE HCL 25 MG PO CAPS
ORAL_CAPSULE | ORAL | Status: AC
  Filled 2019-08-19: qty 1

## 2019-08-19 NOTE — Patient Instructions (Signed)
Blood Transfusion, Adult, Care After This sheet gives you information about how to care for yourself after your procedure. Your doctor may also give you more specific instructions. If you have problems or questions, contact your doctor. Follow these instructions at home:   Take over-the-counter and prescription medicines only as told by your doctor.  Go back to your normal activities as told by your doctor.  Follow instructions from your doctor about how to take care of the area where an IV tube was put into your vein (insertion site). Make sure you: ? Wash your hands with soap and water before you change your bandage (dressing). If there is no soap and water, use hand sanitizer. ? Change your bandage as told by your doctor.  Check your IV insertion site every day for signs of infection. Check for: ? More redness, swelling, or pain. ? More fluid or blood. ? Warmth. ? Pus or a bad smell. Contact a doctor if:  You have more redness, swelling, or pain around the IV insertion site.  You have more fluid or blood coming from the IV insertion site.  Your IV insertion site feels warm to the touch.  You have pus or a bad smell coming from the IV insertion site.  Your pee (urine) turns pink, red, or brown.  You feel weak after doing your normal activities. Get help right away if:  You have signs of a serious allergic or body defense (immune) system reaction, including: ? Itchiness. ? Hives. ? Trouble breathing. ? Anxiety. ? Pain in your chest or lower back. ? Fever, flushing, and chills. ? Fast pulse. ? Rash. ? Watery poop (diarrhea). ? Throwing up (vomiting). ? Dark pee. ? Serious headache. ? Dizziness. ? Stiff neck. ? Yellow color in your face or the white parts of your eyes (jaundice). Summary  After a blood transfusion, return to your normal activities as told by your doctor.  Every day, check for signs of infection where the IV tube was put into your vein.  Some  signs of infection are warm skin, more redness and pain, more fluid or blood, and pus or a bad smell where the needle went in.  Contact your doctor if you feel weak or have any unusual symptoms. This information is not intended to replace advice given to you by your health care provider. Make sure you discuss any questions you have with your health care provider. Document Released: 10/24/2014 Document Revised: 02/07/2018 Document Reviewed: 05/27/2016 Elsevier Patient Education  2020 [employer]  

## 2019-08-19 NOTE — Telephone Encounter (Signed)
I talk with patient regarding 11/16

## 2019-08-20 LAB — TYPE AND SCREEN
ABO/RH(D): A POS
Antibody Screen: NEGATIVE
Unit division: 0

## 2019-08-20 LAB — BPAM RBC
Blood Product Expiration Date: 202011262359
ISSUE DATE / TIME: 202011021550
Unit Type and Rh: 6200

## 2019-08-20 LAB — HAPTOGLOBIN: Haptoglobin: 104 mg/dL (ref 34–355)

## 2019-08-22 ENCOUNTER — Other Ambulatory Visit: Payer: Self-pay | Admitting: Oncology

## 2019-08-26 ENCOUNTER — Inpatient Hospital Stay: Payer: Medicare Other

## 2019-08-26 ENCOUNTER — Other Ambulatory Visit: Payer: Self-pay

## 2019-08-26 VITALS — BP 152/72 | HR 52 | Temp 98.2°F | Resp 18

## 2019-08-26 DIAGNOSIS — Z5111 Encounter for antineoplastic chemotherapy: Secondary | ICD-10-CM | POA: Diagnosis not present

## 2019-08-26 DIAGNOSIS — Z940 Kidney transplant status: Secondary | ICD-10-CM

## 2019-08-26 DIAGNOSIS — D469 Myelodysplastic syndrome, unspecified: Secondary | ICD-10-CM

## 2019-08-26 DIAGNOSIS — D61818 Other pancytopenia: Secondary | ICD-10-CM

## 2019-08-26 DIAGNOSIS — D649 Anemia, unspecified: Secondary | ICD-10-CM

## 2019-08-26 DIAGNOSIS — D696 Thrombocytopenia, unspecified: Secondary | ICD-10-CM

## 2019-08-26 LAB — CBC WITH DIFFERENTIAL/PLATELET
Abs Immature Granulocytes: 1.25 10*3/uL — ABNORMAL HIGH (ref 0.00–0.07)
Basophils Absolute: 0 10*3/uL (ref 0.0–0.1)
Basophils Relative: 0 %
Eosinophils Absolute: 0 10*3/uL (ref 0.0–0.5)
Eosinophils Relative: 0 %
HCT: 23.7 % — ABNORMAL LOW (ref 39.0–52.0)
Hemoglobin: 7.5 g/dL — ABNORMAL LOW (ref 13.0–17.0)
Immature Granulocytes: 22 %
Lymphocytes Relative: 23 %
Lymphs Abs: 1.3 10*3/uL (ref 0.7–4.0)
MCH: 33 pg (ref 26.0–34.0)
MCHC: 31.6 g/dL (ref 30.0–36.0)
MCV: 104.4 fL — ABNORMAL HIGH (ref 80.0–100.0)
Monocytes Absolute: 0.8 10*3/uL (ref 0.1–1.0)
Monocytes Relative: 13 %
Neutro Abs: 2.4 10*3/uL (ref 1.7–7.7)
Neutrophils Relative %: 42 %
Platelets: 45 10*3/uL — ABNORMAL LOW (ref 150–400)
RBC: 2.27 MIL/uL — ABNORMAL LOW (ref 4.22–5.81)
RDW: 27.1 % — ABNORMAL HIGH (ref 11.5–15.5)
WBC: 5.7 10*3/uL (ref 4.0–10.5)
nRBC: 15.2 % — ABNORMAL HIGH (ref 0.0–0.2)

## 2019-08-26 LAB — RETICULOCYTES
Immature Retic Fract: 35.7 % — ABNORMAL HIGH (ref 2.3–15.9)
RBC.: 2.21 MIL/uL — ABNORMAL LOW (ref 4.22–5.81)
Retic Count, Absolute: 187.2 10*3/uL — ABNORMAL HIGH (ref 19.0–186.0)
Retic Ct Pct: 8.5 % — ABNORMAL HIGH (ref 0.4–3.1)

## 2019-08-26 LAB — FERRITIN: Ferritin: 1541 ng/mL — ABNORMAL HIGH (ref 24–336)

## 2019-08-26 LAB — LACTATE DEHYDROGENASE: LDH: 385 U/L — ABNORMAL HIGH (ref 98–192)

## 2019-08-26 MED ORDER — EPOETIN ALFA-EPBX 40000 UNIT/ML IJ SOLN
40000.0000 [IU] | Freq: Once | INTRAMUSCULAR | Status: AC
  Administered 2019-08-26: 40000 [IU] via SUBCUTANEOUS

## 2019-08-27 ENCOUNTER — Telehealth: Payer: Self-pay | Admitting: Oncology

## 2019-08-27 ENCOUNTER — Other Ambulatory Visit: Payer: Self-pay | Admitting: Oncology

## 2019-08-27 MED ORDER — FOLIC ACID 1 MG PO TABS: 1.0000 mg | ORAL_TABLET | Freq: Every day | ORAL | 4 refills | Status: AC

## 2019-08-27 NOTE — Telephone Encounter (Signed)
Scheduled appt per 11/9 sch message - called pt to confirm appt . Unable to reach pt . Left message with appt date and time.

## 2019-08-27 NOTE — Progress Notes (Unsigned)
I called [name] at [facility] to discuss [name] case.  He agrees that it is pretty complicated.  He thinks we are to continue to treat as we are doing for now.  They are going to review the case in 3 to 4 weeks.  I will discuss this with [name] when he returns to see me this Friday.

## 2019-08-28 ENCOUNTER — Telehealth: Payer: Self-pay | Admitting: *Deleted

## 2019-08-28 NOTE — Telephone Encounter (Signed)
This RN spoke with pt per his call stating he was called about having an appointment with Dr Jannifer Rodney this Friday but he has an appointment at [facility] per MD referral on same day.  This RN reviewed schedule with noted pt having an appointment the following Monday with next cycle of chemo.  This RN inquired about pt's current status with prior request for blood ( scheduled for transfusion on Friday as well ) with [facility] stating he is faring well at present- he is ok foregoing the blood transfusion.  MD aware of above - no further needs- this RN cancelled appointments for this Friday.

## 2019-08-30 ENCOUNTER — Ambulatory Visit: Payer: Medicare Other

## 2019-08-30 ENCOUNTER — Ambulatory Visit: Payer: Medicare Other | Admitting: Oncology

## 2019-08-30 ENCOUNTER — Other Ambulatory Visit: Payer: Medicare Other

## 2019-09-01 ENCOUNTER — Other Ambulatory Visit: Payer: Self-pay | Admitting: Oncology

## 2019-09-01 NOTE — Progress Notes (Signed)
[facility]  [phone] [phone]    ID: [name] DOB: May 12, 1946  MR#: [id number]  KVQ#:[id number]  Patient Care Team: Rogers Blocker, MD as PCP - General (Internal Medicine) Yosselin Zoeller, Virgie Dad, MD as Consulting Physician (Oncology) Donato Heinz, MD as Consulting Physician (Nephrology) Ander Slade, Carlisle Beers, MD as Referring Physician (Ophthalmology) [name] Erskine Squibb, MD as Referring Physician (Vascular Surgery) Brantley Fling, MD as Referring Physician (Internal Medicine) OTHER MD:  CHIEF COMPLAINT: MDS  CURRENT TREATMENT: Retacrit, azacitidine   INTERVAL HISTORY: [name] returns today for follow-up and treatment of MDS.   His most recent blood transfusion was 08/18/2021.  Blood actually dropped to below 6 weeks  Given his good reticulocyte response and the fact that his hemoglobin had dropped to below 6 earlier this month we checked for hemolysis and he had a negative DAT and negative haptoglobin.  I wonder if he had a small bleed at the time to explain the drop  Since his last visit here I asked Dr. Florene Glen at [facility] if he could consult on [name] to make sure that we were not missing anything.  Dr. Florene Glen saw Mr. [name] on 08/30/2019.  His note is in care everywhere.  He suggested we could cut back the azacytidine to 5 days every 28 days, repeat a bone marrow biopsy just before cycle 5, and assuming all is going well around that time and the marrow is becoming hypocellular consider changing the treatment to up to every 43 days to allow for blood count recovery.  He also suggested resuming allopurinol.  Mr. Petrosian continues on Retacrit. We are following his hemoglobin, reticulocytes and RBC transfusions: Today's labs are still pending.  Lab Results  Component Value Date   HGB 7.5 (L) 08/26/2019   HGB 5.8 (LL) 08/19/2019   HGB 7.2 (L) 08/12/2019   HGB 7.7 (L) 08/07/2019   HGB 8.4 (L) 08/06/2019    His main treatment  of course is azacytidine.   Today is day 1 cycle 3.  We are cutting back to 6 days since he has to come back on day 8 anyway for receiving his Retacrit.  If he has any difficulty from that we will stop at day 5 onsubsequent cycles.  REVIEW OF SYSTEMS: [name] is very stable clinically.  He and his wife are taking appropriate pandemic precautions he is concerned about losing weight.  He is eating as best he can, considering that he does have altered taste and not much appetite.  He does not like meat for example.  He is not exercising regularly and is a concern.  We did discuss how to do that safely and reviewed fall precautions.  He does use a cane.  Aside from these issues he has had no unusual headaches visual changes cough phlegm production pleurisy shortness of breath or any fever rash or bleeding that he is aware of.  A detailed review of systems was otherwise stable   HISTORY OF CURRENT ILLNESS: From the original intake note:  [name] is a 73 year old [place] man with a history of type 2 diabetes complicated by significant retinopathy and end-stage renal disease, status post renal transplant, followed by Dr. Marval Regal.  Delage notes that he was feeling more fatigued than usual, and that last time he felt this way, his Vitamin D levels were low. He called his doctors office to ask for his blood work to be ran before he was sent to the ED by Dr. Marlou Sa.  The patient presented to the emergency room 03/22/2019 with symptomatic anemia, was found to have hemoglobin of 5.9 and pancytopenia.  Additional work-up is listed below in the assessment section, but there was no evidence of B12, folate or iron deficiency and reticulocyte count was inappropriately normal.  HIV and SARS coronavirus 2 tests were negative.  Creatinine was 1.49 with GFR was 54.  Glucose was 221.  Other labs of interest obtained 03/23/2019 included a B12 level of 944, folate 13.2, ferritin 1060, with serum iron 134,  saturation 41%.  The absolute reticulocyte count was 64.5.  His end-stage renal disease is felt to be due to diabetes and hypertension.  He was on dialysis starting 2010 but on 03/31/2015 he underwent a cadaveric renal transplant at [facility].  He had transient CMV viremia, which was treated.  He continues on Bactrim prophylaxis and tacrolimus.  He also receives mycophenolate and when he had the episode of severe anemia and a mycophenolate was decreased in half and his Bactrim was decreased to Monday Wednesday Friday.  His tacrolimus level was on the high side.  This has been since adjusted by Dr. Marval Regal and is being repeated today  The patient was transfused and discharged and referred here for evaluation of pancytopenia   PAST MEDICAL HISTORY: Past Medical History:  Diagnosis Date  . Anemia    hx of at beginnning of dialysis- 05/2009  . Arthritis    right hip   . End stage renal disease ([place])    [place]   . End stage renal disease on dialysis ([place])   . Essential hypertension, malignant   . Hypertrophy of prostate without urinary obstruction and other lower urinary tract symptoms (LUTS)   . Myelodysplasia (myelodysplastic syndrome) ([place]) 05/20/2019  . Pure hypercholesterolemia   . Renal insufficiency   . Type II or unspecified type diabetes mellitus without mention of complication, not stated as uncontrolled    TYPE 2      PAST SURGICAL HISTORY: Past Surgical History:  Procedure Laterality Date  . A/V FISTULAGRAM Left 06/12/2017   Procedure: A/V Fistulagram;  Surgeon: Algernon Huxley, MD;  Location: [facility];  Service: Cardiovascular;  Laterality: Left;  . CARDIAC CATHETERIZATION  04/29/10   normal coronaries, LVEF 45%, mild global LV hypokinesis ([place])  . CATARACT EXTRACTION W/PHACO Right 04/03/2013   Procedure: CATARACT EXTRACTION PHACO AND INTRAOCULAR LENS PLACEMENT (IOC);  Surgeon: Adonis Brook, MD;   Location: [facility];  Service: Ophthalmology;  Laterality: Right;  . CERVICAL DISCECTOMY  03/1999  . COLONOSCOPY     Hx: of  . CYSTOSCOPY WITH BIOPSY  11/01/2012   Procedure: CYSTOSCOPY WITH BIOPSY;  Surgeon: Molli Hazard, MD;  Location: [facility];  Service: Urology;  Laterality: N/A;  with fulgeration  . CYSTOSCOPY WITH RETROGRADE PYELOGRAM, URETEROSCOPY AND STENT PLACEMENT  11/01/2012   Procedure: CYSTOSCOPY WITH RETROGRADE PYELOGRAM, URETEROSCOPY AND STENT PLACEMENT;  Surgeon: Molli Hazard, MD;  [facility];  Service: Urology;  Laterality: Bilateral;  . DIALYSIS FISTULA CREATION  2010  . EYE SURGERY  2012   cataracts  . EYE SURGERY  11/2014  . HOLMIUM LASER APPLICATION  11/16/8655   Procedure: HOLMIUM LASER APPLICATION;  Surgeon: Molli Hazard, MD;  [facility];  Service: Urology;  Laterality: Bilateral;  . KIDNEY TRANSPLANT  2016  . MEMBRANE PEEL Right 09/04/2013   Procedure: MEMBRANE PEEL;  Surgeon: Adonis Brook, MD;  Location: [facility];  Service: Ophthalmology;  Laterality: Right;  . NECK SURGERY  1990  . PARS PLANA VITRECTOMY Right 09/04/2013   Procedure: PARS PLANA VITRECTOMY WITH 23 GAUGE;  Surgeon: Adonis Brook, MD;  Location: [facility];  Service: Ophthalmology;  Laterality: Right;  . PARS PLANA VITRECTOMY Left 02/17/2015  . PARS PLANA VITRECTOMY Left 02/17/2015   Procedure: PARS PLANA VITRECTOMY WITH 25G REMOVAL/SUTURE INTRAOCULAR LENS;  Surgeon: Hayden Pedro, MD;  Location: [facility];  Service: Ophthalmology;  Laterality: Left;  . PERIPHERAL VASCULAR CATHETERIZATION Left 08/08/2016   Procedure: A/V Shuntogram/Fistulagram;  Surgeon: Algernon Huxley, MD;  Location: [facility];  Service: Cardiovascular;  Laterality: Left;  . PERIPHERAL VASCULAR CATHETERIZATION N/A 08/08/2016   Procedure: A/V Shunt Intervention;  Surgeon: Algernon Huxley, MD;  Location: [facility];  Service: Cardiovascular;  Laterality: N/A;  . PHOTOCOAGULATION WITH LASER Right 09/04/2013    Procedure: PHOTOCOAGULATION WITH LASER;  Surgeon: Adonis Brook, MD;  Location: [facility];  Service: Ophthalmology;  Laterality: Right;  ENDOLASER  . TRANSTHORACIC ECHOCARDIOGRAM  06/19/2012   normal LV sys function, EF 55-60%, mild to mod diastolic dysfunction, mild AI ([place])  tonsillectomy @ 73 years of age   FAMILY HISTORY: Family History  Problem Relation Age of Onset  . Breast cancer Mother 14       deceased  . Pneumonia Father 56       deceased  . Hypertension Brother   . Diabetes Mellitus II Brother   . Cancer Sister        Intestinal   The patient's s father died from pneumonia at age 52.  He was originally a Psychologist, sport and exercise and later worked in a Designer, television/film set.  Patients' mother died from breast cancer at age 73. The patient has 7 siblings. Patient denies anyone in her family having breast, ovarian, prostate, or pancreatic cancer.  1 sister died from intestinal cancer.  Another sister, Katharine Look, died from an aneurism.     SOCIAL HISTORY: (Current as of 04/22/2019) Mr. Pesqueira retired from the [employer] office. 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 He notes that he was exposed to agent orange. His wife, [name], is a retired Corporate treasurer. They have two children, Lennette Bihari and Lauren. Lennette Bihari lives in [place] and works at the [employer] for [employer]. Lauren lives in [place] and teaches handicapped children for the [place] school system.Kennith Center has two grandchildren.  His daughter is expecting in August 2020. The patient he belongs to [employer] on [employer]: In the absence of any documentation, Caesar's spouse is automatically her healthcare power of attorney.     HEALTH MAINTENANCE: Social History   Tobacco Use  . Smoking status: Former Smoker    Types: Cigarettes    Quit date: 10/02/1990    Years since quitting: 28.9  . Smokeless tobacco: Never Used  Substance Use Topics  . Alcohol use: Yes    Comment: occasional  .  Drug use: No    Colonoscopy: up to date/ [facility]  Allergies  Allergen Reactions  . Codeine Nausea And Vomiting and Nausea Only  . Doxycycline Nausea And Vomiting and Nausea Only  . Latex Hives and Rash  . Tape Other (See Comments)    The "plastic, waffle-printed" tape tears and bruises the skin    Current Outpatient Medications  Medication Sig Dispense Refill  . acetaminophen (TYLENOL) 325 MG tablet Take 650 mg by mouth every 6 (six) hours as needed (for pain or  headaches).     [name] allopurinol (ZYLOPRIM) 100 MG tablet Take 1 tablet (100 mg total) by mouth daily. 90 tablet 1  . amLODipine (NORVASC) 5 MG tablet Take 1 tablet (5 mg total) by mouth daily. 30 tablet 0  . betamethasone dipropionate (DIPROLENE) 0.05 % ointment     . cephALEXin (KEFLEX) 250 MG capsule Take 1 capsule (250 mg total) by mouth 2 (two) times daily. 12 capsule 0  . cloNIDine (CATAPRES) 0.1 MG tablet Take 0.1 mg by mouth 3 (three) times daily.    [name] donepezil (ARICEPT) 10 MG tablet Take 10 mg by mouth at bedtime.    . dorzolamide (TRUSOPT) 2 % ophthalmic solution Place 1 drop into both eyes 2 (two) times a day.     . doxazosin (CARDURA) 4 MG tablet Take 4 mg by mouth 2 (two) times daily.    . folic acid (FOLVITE) 1 MG tablet Take 1 tablet (1 mg total) by mouth daily. 90 tablet 4  . furosemide (LASIX) 40 MG tablet Take 40-80 mg by mouth See admin instructions. Take 80 mg by mouth in the morning and 40 mg in the evening    . HUMALOG KWIKPEN 100 UNIT/ML KiwkPen Inject 8-16 Units into the skin See admin instructions. Inject 8-16 units into the skin three times a day before meals, PER SLIDING SCALE (BGL 150 or less, use nothing)  3  . Insulin Glargine (LANTUS SOLOSTAR) 100 UNIT/ML Solostar Pen Inject 34 Units into the skin daily before breakfast.     . K Phos Mono-Sod Phos Di & Mono (PHOSPHA 250 NEUTRAL) [phone] MG TABS Take 1-2 tablets by mouth See admin instructions. Take 2 tablets by mouth in the morning and 1 tablet at  bedtime at 8 PM    . ketoconazole (NIZORAL) 2 % shampoo Apply 1 application topically every 7 (seven) days.   2  . KLOR-CON M20 20 MEQ tablet Take 20 mEq by mouth daily.  1  . latanoprost (XALATAN) 0.005 % ophthalmic solution Place 1 drop into both eyes at bedtime.     [name] linagliptin (TRADJENTA) 5 MG TABS tablet Take 5 mg by mouth daily with breakfast.     . losartan (COZAAR) 100 MG tablet Take 100 mg by mouth daily.    . metoprolol tartrate (LOPRESSOR) 25 MG tablet Take 25 mg by mouth 2 (two) times daily.    . Multiple Vitamins-Minerals (ONE-A-DAY MENS 50+ ADVANTAGE) TABS Take 1 tablet by mouth daily with breakfast.    . mupirocin cream (BACTROBAN) 2 % Apply 1 application topically 2 (two) times daily. 15 g 0  . mycophenolate (MYFORTIC) 180 MG EC tablet Take 1 tablet (180 mg total) by mouth 2 (two) times daily. 60 tablet 0  . NON FORMULARY Apply 1 application topically See admin instructions. Skin-So-Soft lotion: Apply as needed to dry sites of skin    . ondansetron (ZOFRAN) 8 MG tablet Take 1 tablet (8 mg total) by mouth 2 (two) times daily as needed (Nausea or vomiting). 30 tablet 1  . sodium chloride (OCEAN) 0.65 % SOLN nasal spray Place 2 sprays into both nostrils as needed for congestion.     . sulfamethoxazole-trimethoprim (BACTRIM) 400-80 MG tablet Take 1 tablet by mouth every Monday, Wednesday, and Friday.    . tacrolimus (PROGRAF) 0.5 MG capsule Take 0.5 mg by mouth 2 (two) times daily.    . tacrolimus (PROGRAF) 1 MG capsule Take 3 mg by mouth 2 (two) times daily.   11  . tadalafil (  CIALIS) 20 MG tablet Take 20 mg by mouth daily as needed for erectile dysfunction.     [name] VIGAMOX 0.5 % ophthalmic solution INSTILL 1 DROP INTO RIGHT EYE 4 TIMES A DAY    . Vitamin D, Ergocalciferol, (DRISDOL) 50000 units CAPS capsule Take 50,000 Units by mouth every 7 (seven) days.      No current facility-administered medications for this visit.      OBJECTIVE: Older African-American man using a cane   Vitals:   09/02/19 1235  BP: (!) 145/48  Pulse: 65  Resp: 18  Temp: 98.3 F (36.8 C)  SpO2: 100%   Wt Readings from Last 3 Encounters:  09/02/19 194 lb 4.8 oz (88.1 kg)  08/19/19 201 lb 3.2 oz (91.3 kg)  08/12/19 201 lb 8 oz (91.4 kg)   Body mass index is 26.35 kg/m.    ECOG FS:2 - Symptomatic, <50% confined to bed  Sclerae unicteric, EOMs intact Wearing a mask No cervical or supraclavicular adenopathy Lungs no rales or rhonchi Heart regular rate and rhythm Abd soft, nontender, positive bowel sounds Neuro: nonfocal, well oriented, positive affect   LAB RESULTS:   CMP     Component Value Date/Time   NA 140 08/12/2019 1339   NA 135 (L) 07/04/2013 1256   K 3.4 (L) 08/12/2019 1339   K 4.4 07/11/2013 1108   CL 101 08/12/2019 1339   CL 96 (L) 07/04/2013 1256   CO2 29 08/12/2019 1339   CO2 32 07/04/2013 1256   GLUCOSE 160 (H) 08/12/2019 1339   GLUCOSE 189 (H) 07/04/2013 1256   BUN 13 08/12/2019 1339   BUN 26 (H) 07/04/2013 1256   CREATININE 1.42 (H) 08/12/2019 1339   CREATININE 1.35 (H) 04/26/2019 1455   CREATININE 10.86 (H) 07/04/2013 1256   CALCIUM 10.4 (H) 08/12/2019 1339   CALCIUM 8.8 07/04/2013 1256   PROT 7.1 08/12/2019 1339   ALBUMIN 3.7 08/12/2019 1339   AST 18 08/12/2019 1339   AST 23 04/26/2019 1455   ALT 10 08/12/2019 1339   ALT 19 04/26/2019 1455   ALKPHOS 94 08/12/2019 1339   BILITOT 1.4 (H) 08/12/2019 1339   BILITOT 0.9 04/26/2019 1455   GFRNONAA 49 (L) 08/12/2019 1339   GFRNONAA 53 (L) 04/26/2019 1455   GFRNONAA 4 (L) 07/04/2013 1256   GFRAA 57 (L) 08/12/2019 1339   GFRAA >60 04/26/2019 1455   GFRAA 5 (L) 07/04/2013 1256    No results found for: TOTALPROTELP, ALBUMINELP, A1GS, A2GS, BETS, BETA2SER, GAMS, MSPIKE, SPEI  No results found for: KPAFRELGTCHN, LAMBDASER, KAPLAMBRATIO  Lab Results  Component Value Date   WBC 5.7 08/26/2019   NEUTROABS 2.4 08/26/2019   HGB 7.5 (L) 08/26/2019   HCT 23.7 (L) 08/26/2019   MCV 104.4 (H)  08/26/2019   PLT 45 (L) 08/26/2019    No results found for: LABCA2  No components found for: [id number]  No results for input(s): INR in the last 168 hours.  No results found for: LABCA2  No results found for: NAT557  No results found for: DUK025  No results found for: KYH062  No results found for: CA2729  No components found for: HGQUANT  No results found for: CEA1 / No results found for: CEA1   No results found for: AFPTUMOR  No results found for: CHROMOGRNA  No results found for: PSA1  No visits with results within 3 Day(s) from this visit.  Latest known visit with results is:  Appointment on 08/26/2019  Component Date Value Ref Range  Status  . Ferritin 08/26/2019 1,541* 24 - 336 ng/mL Final   Performed at [facility], [place] [address], [place], [place] [address]  . Retic Ct Pct 08/26/2019 8.5* 0.4 - 3.1 % Final  . RBC. 08/26/2019 2.21* 4.22 - 5.81 MIL/uL Final  . Retic Count, Absolute 08/26/2019 187.2* 19.0 - 186.0 K/uL Final  . Immature Retic Fract 08/26/2019 35.7* 2.3 - 15.9 % Final   Performed at [facility], [place] [address] [place], [place] [address]  . LDH 08/26/2019 385* 98 - 192 U/L Final   Performed at [facility], [place] [address] [place], [place] [address]  . WBC 08/26/2019 5.7  4.0 - 10.5 K/uL Final  . RBC 08/26/2019 2.27* 4.22 - 5.81 MIL/uL Final  . Hemoglobin 08/26/2019 7.5* 13.0 - 17.0 g/dL Final  . HCT 08/26/2019 23.7* 39.0 - 52.0 % Final  . MCV 08/26/2019 104.4* 80.0 - 100.0 fL Final  . MCH 08/26/2019 33.0  26.0 - 34.0 pg Final  . MCHC 08/26/2019 31.6  30.0 - 36.0 g/dL Final  . RDW 08/26/2019 27.1* 11.5 - 15.5 % Final  . Platelets 08/26/2019 45* 150 - 400 K/uL Final   Comment: Immature Platelet Fraction may be clinically indicated, consider ordering this additional test [id number]   . nRBC 08/26/2019 15.2* 0.0 - 0.2 % Final  . Neutrophils Relative % 08/26/2019 42  %  Final  . Neutro Abs 08/26/2019 2.4  1.7 - 7.7 K/uL Final  . Lymphocytes Relative 08/26/2019 23  % Final  . Lymphs Abs 08/26/2019 1.3  0.7 - 4.0 K/uL Final  . Monocytes Relative 08/26/2019 13  % Final  . Monocytes Absolute 08/26/2019 0.8  0.1 - 1.0 K/uL Final  . Eosinophils Relative 08/26/2019 0  % Final  . Eosinophils Absolute 08/26/2019 0.0  0.0 - 0.5 K/uL Final  . Basophils Relative 08/26/2019 0  % Final  . Basophils Absolute 08/26/2019 0.0  0.0 - 0.1 K/uL Final  . Immature Granulocytes 08/26/2019 22  % Final   Increased IG's, likely caused by Bone Marrow Colony Stimulating Factor received within 30 days.  . Abs Immature Granulocytes 08/26/2019 1.25* 0.00 - 0.07 K/uL Final  . Polychromasia 08/26/2019 PRESENT   Final  . Ovalocytes 08/26/2019 PRESENT   Final   Performed at [facility], [place] [address], [place], [place] [address]    (this displays the last labs from the last 3 days)  No results found for: TOTALPROTELP, ALBUMINELP, A1GS, A2GS, BETS, BETA2SER, GAMS, MSPIKE, SPEI (this displays SPEP labs)  No results found for: KPAFRELGTCHN, LAMBDASER, KAPLAMBRATIO (kappa/lambda light chains)  No results found for: HGBA, HGBA2QUANT, HGBFQUANT, HGBSQUAN (Hemoglobinopathy evaluation)   Lab Results  Component Value Date   LDH 385 (H) 08/26/2019    Lab Results  Component Value Date   IRON 134 03/23/2019   TIBC 326 03/23/2019   IRONPCTSAT 41 (H) 03/23/2019   (Iron and TIBC)  Lab Results  Component Value Date   FERRITIN 1,541 (H) 08/26/2019    Urinalysis    Component Value Date/Time   COLORURINE YELLOW 03/22/2019 [place] 03/22/2019 1609   LABSPEC 1.010 03/22/2019 1609   PHURINE 6.0 03/22/2019 1609   GLUCOSEU NEGATIVE 03/22/2019 [place] 03/22/2019 [place] 03/22/2019 [place] 03/22/2019 [place] 03/22/2019 1609   UROBILINOGEN 0.2 08/17/2012 1120   NITRITE  NEGATIVE 03/22/2019 1609  LEUKOCYTESUR NEGATIVE 03/22/2019 1609     STUDIES:  No results found.   ELIGIBLE FOR AVAILABLE RESEARCH PROTOCOL: no   ASSESSMENT: 73 y.o. [place] man with a history of end-stage renal disease secondary to hypertension and diabetes, on hemodialysis as of 2010  (a) status post renal transplant at [facility] 03/31/2015  (1) pancytopenia noted 03/22/2019 with fatigue and shortness of breath  (a) smear review confirmed pancytopenia and macrocytosis with hypogranular polys   (b) Bone Marrow Biopsy on 05/02/2019 shows hypercellular marrow with trilineal dysplasia, slightly left shifted myeloid maturation but no increase in blast, mild myelofibrosis  (c) Flow Cytometry shows no increase in CD34 positive blasts and left shifted myeloid maturation   (d) 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   (2) myelodysplasia, as per #1 above  (a) prognosis: Moderate risk  (3) retacrit started 05/22/2019 at 20K units, repeated weekly  (a) baseline epo level >500, which predicts a poor response to EPO  (b) ferritin 1060 on 03/23/2019  (c) retacrit dose increased to 40K weekly starting 06/19/2019  (4) azacytidine started 07/08/2019, initially repeated daily x5  (a) cycle 2 started 08/05/2019, received 7 days treatment, not well tolerated  (c) cycles decreased to 6 days every 28 days starting with cycle 3 (09/02/2019)   PLAN: Mr. Spano will start cycle 3 of azacytidine today.  We could start the treatment with 5 days weekly but he has to come back the following Monday anyway for his Retacrit so we will screen for routine time.  If his counts drop we will go to 5 days a week and if there is a significant hypoplasia on the next bone marrow biopsy we will broaden the treatment interval.  I do not have a simple explanation why  his hemoglobin dropped [place] earlier this month.  He has a very good reticulocyte count.  There is no evidence of hemolysis that I can document.  It is possible that he had a small occult bleed at the time that he was not aware of.  I have resumed the allopurinol and 100 mg daily.  We will follow his uric acid and if that does not control it sufficiently we will update  Mr. Bounds will see my physician's assistant at the beginning of the next cycle, which will be January 11 and then will see me February 8 which will be the start of cycle 5.  He will have a bone marrow biopsy a few days before that visit.  He knows to call for any issues that may develop before then.   Virgie Dad. Gelsey Amyx, MD  09/02/19 1:14 PM Medical Oncology and Hematology [facility] [place], [place] [address] Tel. [phone]    Fax. [phone]   I, Wilburn Mylar, am acting as scribe for Dr. Virgie Dad. Evynn Boutelle.   I, Lurline Del MD, have reviewed the above documentation for accuracy and completeness, and I agree with the above.

## 2019-09-02 ENCOUNTER — Ambulatory Visit: Payer: Medicare Other

## 2019-09-02 ENCOUNTER — Other Ambulatory Visit: Payer: Self-pay

## 2019-09-02 ENCOUNTER — Inpatient Hospital Stay: Payer: Medicare Other

## 2019-09-02 ENCOUNTER — Inpatient Hospital Stay: Payer: Medicare Other | Admitting: Nutrition

## 2019-09-02 ENCOUNTER — Inpatient Hospital Stay (HOSPITAL_BASED_OUTPATIENT_CLINIC_OR_DEPARTMENT_OTHER): Payer: Medicare Other | Admitting: Oncology

## 2019-09-02 VITALS — BP 145/48 | HR 65 | Temp 98.3°F | Resp 18 | Ht 72.0 in | Wt 194.3 lb

## 2019-09-02 DIAGNOSIS — E113513 Type 2 diabetes mellitus with proliferative diabetic retinopathy with macular edema, bilateral: Secondary | ICD-10-CM | POA: Diagnosis not present

## 2019-09-02 DIAGNOSIS — I158 Other secondary hypertension: Secondary | ICD-10-CM

## 2019-09-02 DIAGNOSIS — N186 End stage renal disease: Secondary | ICD-10-CM

## 2019-09-02 DIAGNOSIS — D469 Myelodysplastic syndrome, unspecified: Secondary | ICD-10-CM

## 2019-09-02 DIAGNOSIS — D696 Thrombocytopenia, unspecified: Secondary | ICD-10-CM

## 2019-09-02 DIAGNOSIS — D649 Anemia, unspecified: Secondary | ICD-10-CM

## 2019-09-02 DIAGNOSIS — Z940 Kidney transplant status: Secondary | ICD-10-CM

## 2019-09-02 DIAGNOSIS — D702 Other drug-induced agranulocytosis: Secondary | ICD-10-CM

## 2019-09-02 DIAGNOSIS — Z5111 Encounter for antineoplastic chemotherapy: Secondary | ICD-10-CM | POA: Diagnosis not present

## 2019-09-02 DIAGNOSIS — D61818 Other pancytopenia: Secondary | ICD-10-CM

## 2019-09-02 LAB — CBC WITH DIFFERENTIAL/PLATELET
Abs Immature Granulocytes: 0.8 10*3/uL — ABNORMAL HIGH (ref 0.00–0.07)
Basophils Absolute: 0 10*3/uL (ref 0.0–0.1)
Basophils Relative: 1 %
Eosinophils Absolute: 0 10*3/uL (ref 0.0–0.5)
Eosinophils Relative: 0 %
HCT: 25.5 % — ABNORMAL LOW (ref 39.0–52.0)
Hemoglobin: 7.9 g/dL — ABNORMAL LOW (ref 13.0–17.0)
Immature Granulocytes: 21 %
Lymphocytes Relative: 21 %
Lymphs Abs: 0.8 10*3/uL (ref 0.7–4.0)
MCH: 34.5 pg — ABNORMAL HIGH (ref 26.0–34.0)
MCHC: 31 g/dL (ref 30.0–36.0)
MCV: 111.4 fL — ABNORMAL HIGH (ref 80.0–100.0)
Monocytes Absolute: 0.6 10*3/uL (ref 0.1–1.0)
Monocytes Relative: 15 %
Neutro Abs: 1.6 10*3/uL — ABNORMAL LOW (ref 1.7–7.7)
Neutrophils Relative %: 42 %
Platelets: 62 10*3/uL — ABNORMAL LOW (ref 150–400)
RBC: 2.29 MIL/uL — ABNORMAL LOW (ref 4.22–5.81)
RDW: 28.8 % — ABNORMAL HIGH (ref 11.5–15.5)
WBC: 3.8 10*3/uL — ABNORMAL LOW (ref 4.0–10.5)
nRBC: 9.2 % — ABNORMAL HIGH (ref 0.0–0.2)

## 2019-09-02 LAB — COMPREHENSIVE METABOLIC PANEL
ALT: 10 U/L (ref 0–44)
AST: 17 U/L (ref 15–41)
Albumin: 3.7 g/dL (ref 3.5–5.0)
Alkaline Phosphatase: 110 U/L (ref 38–126)
Anion gap: 9 (ref 5–15)
BUN: 14 mg/dL (ref 8–23)
CO2: 27 mmol/L (ref 22–32)
Calcium: 9.8 mg/dL (ref 8.9–10.3)
Chloride: 103 mmol/L (ref 98–111)
Creatinine, Ser: 1.5 mg/dL — ABNORMAL HIGH (ref 0.61–1.24)
GFR calc Af Amer: 54 mL/min — ABNORMAL LOW (ref >60)
GFR calc non Af Amer: 46 mL/min — ABNORMAL LOW (ref >60)
Glucose, Bld: 286 mg/dL — ABNORMAL HIGH (ref 70–99)
Potassium: 3.8 mmol/L (ref 3.5–5.1)
Sodium: 139 mmol/L (ref 135–145)
Total Bilirubin: 2.9 mg/dL — ABNORMAL HIGH (ref 0.3–1.2)
Total Protein: 6.7 g/dL (ref 6.5–8.1)

## 2019-09-02 LAB — URIC ACID: Uric Acid, Serum: 14.1 mg/dL — ABNORMAL HIGH (ref 3.7–8.6)

## 2019-09-02 MED ORDER — ONDANSETRON HCL 8 MG PO TABS
ORAL_TABLET | ORAL | Status: AC
  Filled 2019-09-02: qty 1

## 2019-09-02 MED ORDER — ONDANSETRON HCL 8 MG PO TABS
8.0000 mg | ORAL_TABLET | Freq: Once | ORAL | Status: AC
  Administered 2019-09-02: 8 mg via ORAL

## 2019-09-02 MED ORDER — ALLOPURINOL 100 MG PO TABS: 100.0000 mg | ORAL_TABLET | Freq: Every day | ORAL | 1 refills | Status: AC

## 2019-09-02 MED ORDER — AZACITIDINE CHEMO SQ INJECTION
75.0000 mg/m2 | Freq: Once | INTRAMUSCULAR | Status: AC
  Administered 2019-09-02: 15:00:00 165 mg via SUBCUTANEOUS
  Filled 2019-09-02: qty 6.6

## 2019-09-02 NOTE — Progress Notes (Signed)
Okay to treat with Hgb 7.9, and platelets of 62 per Dr. Jana Hakim.

## 2019-09-02 NOTE — Patient Instructions (Signed)
[facility] Discharge Instructions for Patients Receiving Chemotherapy  Today you received the following chemotherapy agents:  Azacitidine  To help prevent nausea and vomiting after your treatment, we encourage you to take your nausea medication as prescribed.   If you develop nausea and vomiting that is not controlled by your nausea medication, call the clinic.   BELOW ARE SYMPTOMS THAT SHOULD BE REPORTED IMMEDIATELY:  *FEVER GREATER THAN 100.5 F  *CHILLS WITH OR WITHOUT FEVER  NAUSEA AND VOMITING THAT IS NOT CONTROLLED WITH YOUR NAUSEA MEDICATION  *UNUSUAL SHORTNESS OF BREATH  *UNUSUAL BRUISING OR BLEEDING  TENDERNESS IN MOUTH AND THROAT WITH OR WITHOUT PRESENCE OF ULCERS  *URINARY PROBLEMS  *BOWEL PROBLEMS  UNUSUAL RASH Items with * indicate a potential emergency and should be followed up as soon as possible.  Feel free to call the clinic should you have any questions or concerns. The clinic phone number is [phone].  Please show the CHEMO ALERT CARD at check-in to the Emergency Department and triage nurse.   

## 2019-09-02 NOTE — Progress Notes (Signed)
Nutrition follow-up completed with patient. Weight decreased and documented as 194.3 pounds November 16. Noted glucose 286 and creatinine 1.5. Patient reports he still has some altered taste and poor appetite. Reports he has enjoyed some smoothies lately.  He has a protein powder that he is adding to his smoothies. He is concerned with his elevated blood sugars and is wondering about lower carbohydrate nutrition supplements.  Nutrition diagnosis: Unintended weight loss continues.  Intervention: Patient educated to try to continue frequent calories and protein throughout the day. Educated on lower carbohydrate nutrition supplements. Questions were answered.  Teach back method used.  Monitoring, evaluation, goals: Patient will tolerate adequate calories and protein to minimize further weight loss.  Next visit: To be scheduled as needed.  **Disclaimer: This note was dictated with voice recognition software. Similar sounding words can inadvertently be transcribed and this note may contain transcription errors which may not have been corrected upon publication of note.**

## 2019-09-03 ENCOUNTER — Telehealth: Payer: Self-pay

## 2019-09-03 ENCOUNTER — Inpatient Hospital Stay: Payer: Medicare Other

## 2019-09-03 ENCOUNTER — Other Ambulatory Visit: Payer: Self-pay

## 2019-09-03 VITALS — BP 129/61 | HR 64 | Temp 99.1°F | Resp 18

## 2019-09-03 DIAGNOSIS — Z5111 Encounter for antineoplastic chemotherapy: Secondary | ICD-10-CM | POA: Diagnosis not present

## 2019-09-03 DIAGNOSIS — D696 Thrombocytopenia, unspecified: Secondary | ICD-10-CM

## 2019-09-03 DIAGNOSIS — D469 Myelodysplastic syndrome, unspecified: Secondary | ICD-10-CM

## 2019-09-03 MED ORDER — AZACITIDINE CHEMO SQ INJECTION
75.0000 mg/m2 | Freq: Once | INTRAMUSCULAR | Status: AC
  Administered 2019-09-03: 165 mg via SUBCUTANEOUS
  Filled 2019-09-03: qty 6.6

## 2019-09-03 MED ORDER — ONDANSETRON HCL 8 MG PO TABS
8.0000 mg | ORAL_TABLET | Freq: Once | ORAL | Status: AC
  Administered 2019-09-03: 14:00:00 8 mg via ORAL

## 2019-09-03 MED ORDER — ONDANSETRON HCL 8 MG PO TABS
ORAL_TABLET | ORAL | Status: AC
  Filled 2019-09-03: qty 1

## 2019-09-03 NOTE — Telephone Encounter (Signed)
Left vm for patient to call back to schedule bone marrow biopsy for 11/2019.

## 2019-09-03 NOTE — Patient Instructions (Signed)
[facility] Discharge Instructions for Patients Receiving Chemotherapy  Today you received the following chemotherapy agents:  Azacitidine  To help prevent nausea and vomiting after your treatment, we encourage you to take your nausea medication as prescribed.   If you develop nausea and vomiting that is not controlled by your nausea medication, call the clinic.   BELOW ARE SYMPTOMS THAT SHOULD BE REPORTED IMMEDIATELY:  *FEVER GREATER THAN 100.5 F  *CHILLS WITH OR WITHOUT FEVER  NAUSEA AND VOMITING THAT IS NOT CONTROLLED WITH YOUR NAUSEA MEDICATION  *UNUSUAL SHORTNESS OF BREATH  *UNUSUAL BRUISING OR BLEEDING  TENDERNESS IN MOUTH AND THROAT WITH OR WITHOUT PRESENCE OF ULCERS  *URINARY PROBLEMS  *BOWEL PROBLEMS  UNUSUAL RASH Items with * indicate a potential emergency and should be followed up as soon as possible.  Feel free to call the clinic should you have any questions or concerns. The clinic phone number is [phone].  Please show the CHEMO ALERT CARD at check-in to the Emergency Department and triage nurse.   

## 2019-09-04 ENCOUNTER — Other Ambulatory Visit: Payer: Self-pay

## 2019-09-04 ENCOUNTER — Inpatient Hospital Stay: Payer: Medicare Other

## 2019-09-04 ENCOUNTER — Telehealth: Payer: Self-pay

## 2019-09-04 VITALS — BP 147/51 | HR 70 | Temp 98.5°F | Resp 16

## 2019-09-04 DIAGNOSIS — D469 Myelodysplastic syndrome, unspecified: Secondary | ICD-10-CM

## 2019-09-04 DIAGNOSIS — Z5111 Encounter for antineoplastic chemotherapy: Secondary | ICD-10-CM | POA: Diagnosis not present

## 2019-09-04 DIAGNOSIS — D696 Thrombocytopenia, unspecified: Secondary | ICD-10-CM

## 2019-09-04 MED ORDER — ONDANSETRON HCL 8 MG PO TABS
8.0000 mg | ORAL_TABLET | Freq: Once | ORAL | Status: AC
  Administered 2019-09-04: 8 mg via ORAL

## 2019-09-04 MED ORDER — ONDANSETRON HCL 8 MG PO TABS
ORAL_TABLET | ORAL | Status: AC
  Filled 2019-09-04: qty 1

## 2019-09-04 MED ORDER — AZACITIDINE CHEMO SQ INJECTION
75.0000 mg/m2 | Freq: Once | INTRAMUSCULAR | Status: AC
  Administered 2019-09-04: 165 mg via SUBCUTANEOUS
  Filled 2019-09-04: qty 6.6

## 2019-09-04 NOTE — Telephone Encounter (Signed)
Evidently there are nursing meetings that day and it needs to be 2/3.

## 2019-09-04 NOTE — Patient Instructions (Signed)
[facility] Discharge Instructions for Patients Receiving Chemotherapy  Today you received the following chemotherapy agents:  Azacitidine  To help prevent nausea and vomiting after your treatment, we encourage you to take your nausea medication as prescribed.   If you develop nausea and vomiting that is not controlled by your nausea medication, call the clinic.   BELOW ARE SYMPTOMS THAT SHOULD BE REPORTED IMMEDIATELY:  *FEVER GREATER THAN 100.5 F  *CHILLS WITH OR WITHOUT FEVER  NAUSEA AND VOMITING THAT IS NOT CONTROLLED WITH YOUR NAUSEA MEDICATION  *UNUSUAL SHORTNESS OF BREATH  *UNUSUAL BRUISING OR BLEEDING  TENDERNESS IN MOUTH AND THROAT WITH OR WITHOUT PRESENCE OF ULCERS  *URINARY PROBLEMS  *BOWEL PROBLEMS  UNUSUAL RASH Items with * indicate a potential emergency and should be followed up as soon as possible.  Feel free to call the clinic should you have any questions or concerns. The clinic phone number is [phone].  Please show the CHEMO ALERT CARD at check-in to the Emergency Department and triage nurse.   

## 2019-09-04 NOTE — Telephone Encounter (Signed)
Left vm for patient to confirm appointment on 11/19/2019 for bone marrow biopsy at 7:30 am.  Asked for patient to call back and confirm.  Center number left on vm. 

## 2019-09-05 ENCOUNTER — Inpatient Hospital Stay: Payer: Medicare Other

## 2019-09-05 ENCOUNTER — Other Ambulatory Visit: Payer: Self-pay

## 2019-09-05 VITALS — BP 148/62 | HR 78 | Temp 98.9°F | Resp 18

## 2019-09-05 DIAGNOSIS — D649 Anemia, unspecified: Secondary | ICD-10-CM

## 2019-09-05 DIAGNOSIS — D696 Thrombocytopenia, unspecified: Secondary | ICD-10-CM

## 2019-09-05 DIAGNOSIS — Z940 Kidney transplant status: Secondary | ICD-10-CM

## 2019-09-05 DIAGNOSIS — D469 Myelodysplastic syndrome, unspecified: Secondary | ICD-10-CM

## 2019-09-05 DIAGNOSIS — Z5111 Encounter for antineoplastic chemotherapy: Secondary | ICD-10-CM | POA: Diagnosis not present

## 2019-09-05 MED ORDER — AZACITIDINE CHEMO SQ INJECTION
75.0000 mg/m2 | Freq: Once | INTRAMUSCULAR | Status: AC
  Administered 2019-09-05: 165 mg via SUBCUTANEOUS
  Filled 2019-09-05: qty 6.6

## 2019-09-05 MED ORDER — EPOETIN ALFA-EPBX 40000 UNIT/ML IJ SOLN
40000.0000 [IU] | Freq: Once | INTRAMUSCULAR | Status: AC
  Administered 2019-09-05: 40000 [IU] via SUBCUTANEOUS

## 2019-09-05 MED ORDER — ONDANSETRON HCL 8 MG PO TABS
8.0000 mg | ORAL_TABLET | Freq: Once | ORAL | Status: AC
  Administered 2019-09-05: 8 mg via ORAL

## 2019-09-05 MED ORDER — EPOETIN ALFA-EPBX 40000 UNIT/ML IJ SOLN
INTRAMUSCULAR | Status: AC
  Filled 2019-09-05: qty 1

## 2019-09-05 MED ORDER — ONDANSETRON HCL 8 MG PO TABS
ORAL_TABLET | ORAL | Status: AC
  Filled 2019-09-05: qty 1

## 2019-09-05 NOTE — Patient Instructions (Signed)
[facility] Discharge Instructions for Patients Receiving Chemotherapy  Today you received the following chemotherapy agents Vidaza  To help prevent nausea and vomiting after your treatment, we encourage you to take your nausea medication as directed.   If you develop nausea and vomiting that is not controlled by your nausea medication, call the clinic.   BELOW ARE SYMPTOMS THAT SHOULD BE REPORTED IMMEDIATELY:  *FEVER GREATER THAN 100.5 F  *CHILLS WITH OR WITHOUT FEVER  NAUSEA AND VOMITING THAT IS NOT CONTROLLED WITH YOUR NAUSEA MEDICATION  *UNUSUAL SHORTNESS OF BREATH  *UNUSUAL BRUISING OR BLEEDING  TENDERNESS IN MOUTH AND THROAT WITH OR WITHOUT PRESENCE OF ULCERS  *URINARY PROBLEMS  *BOWEL PROBLEMS  UNUSUAL RASH Items with * indicate a potential emergency and should be followed up as soon as possible.  Feel free to call the clinic should you have any questions or concerns. The clinic phone number is [phone].  Please show the [facility] at check-in to the Emergency Department and triage nurse.  Epoetin Alfa injection What is this medicine? EPOETIN ALFA (e POE e tin AL fa) helps your body make more red blood cells. This medicine is used to treat anemia caused by chronic kidney disease, cancer chemotherapy, or HIV-therapy. It may also be used before surgery if you have anemia. This medicine may be used for other purposes; ask your health care provider or pharmacist if you have questions. COMMON BRAND NAME(S): Epogen, Procrit, Retacrit What should I tell my health care provider before I take this medicine? They need to know if you have any of these conditions:  cancer  heart disease  high blood pressure  history of blood clots  history of stroke  low levels of folate, iron, or vitamin B12 in the blood  seizures  an unusual or allergic reaction to erythropoietin, albumin, benzyl alcohol, hamster proteins, other medicines, foods, dyes, or  preservatives  pregnant or trying to get pregnant  breast-feeding How should I use this medicine? This medicine is for injection into a vein or under the skin. It is usually given by a health care professional in a hospital or clinic setting. If you get this medicine at home, you will be taught how to prepare and give this medicine. Use exactly as directed. Take your medicine at regular intervals. Do not take your medicine more often than directed. It is important that you put your used needles and syringes in a special sharps container. Do not put them in a trash can. If you do not have a sharps container, call your pharmacist or healthcare provider to get one. A special MedGuide will be given to you by the pharmacist with each prescription and refill. Be sure to read this information carefully each time. Talk to your pediatrician regarding the use of this medicine in children. While this drug may be prescribed for selected conditions, precautions do apply. Overdosage: If you think you have taken too much of this medicine contact a poison control center or emergency room at once. NOTE: This medicine is only for you. Do not share this medicine with others. What if I miss a dose? If you miss a dose, take it as soon as you can. If it is almost time for your next dose, take only that dose. Do not take double or extra doses. What may interact with this medicine? Interactions have not been studied. This list may not describe all possible interactions. Give your health care provider a list of all the  medicines, herbs, non-prescription drugs, or dietary supplements you use. Also tell them if you smoke, drink alcohol, or use illegal drugs. Some items may interact with your medicine. What should I watch for while using this medicine? Your condition will be monitored carefully while you are receiving this medicine. You may need blood work done while you are taking this medicine. This medicine may cause a  decrease in vitamin B6. You should make sure that you get enough vitamin B6 while you are taking this medicine. Discuss the foods you eat and the vitamins you take with your health care professional. What side effects may I notice from receiving this medicine? Side effects that you should report to your doctor or health care professional as soon as possible:  allergic reactions like skin rash, itching or hives, swelling of the face, lips, or tongue  seizures  signs and symptoms of a blood clot such as breathing problems; changes in vision; chest pain; severe, sudden headache; pain, swelling, warmth in the leg; trouble speaking; sudden numbness or weakness of the face, arm or leg  signs and symptoms of a stroke like changes in vision; confusion; trouble speaking or understanding; severe headaches; sudden numbness or weakness of the face, arm or leg; trouble walking; dizziness; loss of balance or coordination Side effects that usually do not require medical attention (report to your doctor or health care professional if they continue or are bothersome):  chills  cough  dizziness  fever  headaches  joint pain  muscle cramps  muscle pain  nausea, vomiting  pain, redness, or irritation at site where injected This list may not describe all possible side effects. Call your doctor for medical advice about side effects. You may report side effects to FDA at 1-800-FDA-1088. Where should I keep my medicine? Keep out of the reach of children. Store in a refrigerator between 2 and 8 degrees C (36 and 46 degrees F). Do not freeze or shake. Throw away any unused portion if using a single-dose vial. Multi-dose vials can be kept in the refrigerator for up to 21 days after the initial dose. Throw away unused medicine. NOTE: This sheet is a summary. It may not cover all possible information. If you have questions about this medicine, talk to your doctor, pharmacist, or health care provider.  2020  Elsevier/Gold Standard (2017-05-12 08:35:19)

## 2019-09-06 ENCOUNTER — Inpatient Hospital Stay: Payer: Medicare Other

## 2019-09-06 VITALS — BP 155/59 | HR 71 | Temp 98.7°F | Resp 18

## 2019-09-06 DIAGNOSIS — D696 Thrombocytopenia, unspecified: Secondary | ICD-10-CM

## 2019-09-06 DIAGNOSIS — D469 Myelodysplastic syndrome, unspecified: Secondary | ICD-10-CM

## 2019-09-06 DIAGNOSIS — Z5111 Encounter for antineoplastic chemotherapy: Secondary | ICD-10-CM | POA: Diagnosis not present

## 2019-09-06 MED ORDER — ONDANSETRON HCL 8 MG PO TABS
8.0000 mg | ORAL_TABLET | Freq: Once | ORAL | Status: AC
  Administered 2019-09-06: 8 mg via ORAL

## 2019-09-06 MED ORDER — ONDANSETRON HCL 8 MG PO TABS
ORAL_TABLET | ORAL | Status: AC
  Filled 2019-09-06: qty 1

## 2019-09-06 MED ORDER — AZACITIDINE CHEMO SQ INJECTION
75.0000 mg/m2 | Freq: Once | INTRAMUSCULAR | Status: AC
  Administered 2019-09-06: 165 mg via SUBCUTANEOUS
  Filled 2019-09-06: qty 6.6

## 2019-09-06 NOTE — Patient Instructions (Signed)
[facility] Discharge Instructions for Patients Receiving Chemotherapy  Today you received the following chemotherapy agents Vidaza  To help prevent nausea and vomiting after your treatment, we encourage you to take your nausea medication as directed  If you develop nausea and vomiting that is not controlled by your nausea medication, call the clinic.   BELOW ARE SYMPTOMS THAT SHOULD BE REPORTED IMMEDIATELY:  *FEVER GREATER THAN 100.5 F  *CHILLS WITH OR WITHOUT FEVER  NAUSEA AND VOMITING THAT IS NOT CONTROLLED WITH YOUR NAUSEA MEDICATION  *UNUSUAL SHORTNESS OF BREATH  *UNUSUAL BRUISING OR BLEEDING  TENDERNESS IN MOUTH AND THROAT WITH OR WITHOUT PRESENCE OF ULCERS  *URINARY PROBLEMS  *BOWEL PROBLEMS  UNUSUAL RASH Items with * indicate a potential emergency and should be followed up as soon as possible.  Feel free to call the clinic should you have any questions or concerns. The clinic phone number is [phone].  Please show the CHEMO ALERT CARD at check-in to the Emergency Department and triage nurse.   

## 2019-09-09 ENCOUNTER — Telehealth: Payer: Self-pay | Admitting: *Deleted

## 2019-09-09 ENCOUNTER — Inpatient Hospital Stay: Payer: Medicare Other

## 2019-09-09 ENCOUNTER — Other Ambulatory Visit: Payer: Self-pay | Admitting: Oncology

## 2019-09-09 ENCOUNTER — Other Ambulatory Visit: Payer: Self-pay

## 2019-09-09 ENCOUNTER — Other Ambulatory Visit: Payer: Self-pay | Admitting: *Deleted

## 2019-09-09 VITALS — BP 151/42 | HR 72 | Temp 98.7°F | Resp 16 | Ht 72.0 in | Wt 194.2 lb

## 2019-09-09 DIAGNOSIS — D469 Myelodysplastic syndrome, unspecified: Secondary | ICD-10-CM

## 2019-09-09 DIAGNOSIS — D649 Anemia, unspecified: Secondary | ICD-10-CM

## 2019-09-09 DIAGNOSIS — D61818 Other pancytopenia: Secondary | ICD-10-CM

## 2019-09-09 DIAGNOSIS — E113513 Type 2 diabetes mellitus with proliferative diabetic retinopathy with macular edema, bilateral: Secondary | ICD-10-CM

## 2019-09-09 DIAGNOSIS — D696 Thrombocytopenia, unspecified: Secondary | ICD-10-CM

## 2019-09-09 DIAGNOSIS — Z940 Kidney transplant status: Secondary | ICD-10-CM

## 2019-09-09 DIAGNOSIS — I158 Other secondary hypertension: Secondary | ICD-10-CM

## 2019-09-09 DIAGNOSIS — Z5111 Encounter for antineoplastic chemotherapy: Secondary | ICD-10-CM | POA: Diagnosis not present

## 2019-09-09 LAB — COMPREHENSIVE METABOLIC PANEL
ALT: 10 U/L (ref 0–44)
AST: 16 U/L (ref 15–41)
Albumin: 3.8 g/dL (ref 3.5–5.0)
Alkaline Phosphatase: 114 U/L (ref 38–126)
Anion gap: 11 (ref 5–15)
BUN: 19 mg/dL (ref 8–23)
CO2: 26 mmol/L (ref 22–32)
Calcium: 9.7 mg/dL (ref 8.9–10.3)
Chloride: 105 mmol/L (ref 98–111)
Creatinine, Ser: 1.3 mg/dL — ABNORMAL HIGH (ref 0.61–1.24)
GFR calc Af Amer: >60 mL/min (ref >60)
GFR calc non Af Amer: 55 mL/min — ABNORMAL LOW (ref >60)
Glucose, Bld: 91 mg/dL (ref 70–99)
Potassium: 3.9 mmol/L (ref 3.5–5.1)
Sodium: 142 mmol/L (ref 135–145)
Total Bilirubin: 1.6 mg/dL — ABNORMAL HIGH (ref 0.3–1.2)
Total Protein: 6.4 g/dL — ABNORMAL LOW (ref 6.5–8.1)

## 2019-09-09 LAB — CBC WITH DIFFERENTIAL/PLATELET
Abs Immature Granulocytes: 0.36 10*3/uL — ABNORMAL HIGH (ref 0.00–0.07)
Basophils Absolute: 0 10*3/uL (ref 0.0–0.1)
Basophils Relative: 0 %
Eosinophils Absolute: 0 10*3/uL (ref 0.0–0.5)
Eosinophils Relative: 0 %
HCT: 21 % — ABNORMAL LOW (ref 39.0–52.0)
Hemoglobin: 6.6 g/dL — CL (ref 13.0–17.0)
Immature Granulocytes: 14 %
Lymphocytes Relative: 33 %
Lymphs Abs: 0.9 10*3/uL (ref 0.7–4.0)
MCH: 34.9 pg — ABNORMAL HIGH (ref 26.0–34.0)
MCHC: 31.4 g/dL (ref 30.0–36.0)
MCV: 111.1 fL — ABNORMAL HIGH (ref 80.0–100.0)
Monocytes Absolute: 0.4 10*3/uL (ref 0.1–1.0)
Monocytes Relative: 14 %
Neutro Abs: 1 10*3/uL — ABNORMAL LOW (ref 1.7–7.7)
Neutrophils Relative %: 39 %
Platelets: 53 10*3/uL — ABNORMAL LOW (ref 150–400)
RBC: 1.89 MIL/uL — ABNORMAL LOW (ref 4.22–5.81)
RDW: 27.2 % — ABNORMAL HIGH (ref 11.5–15.5)
WBC: 2.7 10*3/uL — ABNORMAL LOW (ref 4.0–10.5)
nRBC: 3.4 % — ABNORMAL HIGH (ref 0.0–0.2)

## 2019-09-09 LAB — RETICULOCYTES
Immature Retic Fract: 8.6 % (ref 2.3–15.9)
RBC.: 1.95 MIL/uL — ABNORMAL LOW (ref 4.22–5.81)
Retic Count, Absolute: 85 10*3/uL (ref 19.0–186.0)
Retic Ct Pct: 4.4 % — ABNORMAL HIGH (ref 0.4–3.1)

## 2019-09-09 LAB — LACTATE DEHYDROGENASE: LDH: 301 U/L — ABNORMAL HIGH (ref 98–192)

## 2019-09-09 MED ORDER — AZACITIDINE CHEMO SQ INJECTION
75.0000 mg/m2 | Freq: Once | INTRAMUSCULAR | Status: AC
  Administered 2019-09-09: 16:00:00 165 mg via SUBCUTANEOUS
  Filled 2019-09-09: qty 6.6

## 2019-09-09 MED ORDER — ONDANSETRON HCL 8 MG PO TABS
8.0000 mg | ORAL_TABLET | Freq: Once | ORAL | Status: AC
  Administered 2019-09-09: 8 mg via ORAL

## 2019-09-09 MED ORDER — ONDANSETRON HCL 8 MG PO TABS
ORAL_TABLET | ORAL | Status: AC
  Filled 2019-09-09: qty 1

## 2019-09-09 MED ORDER — EPOETIN ALFA-EPBX 40000 UNIT/ML IJ SOLN: 40000.0000 [IU] | Freq: Once | INTRAMUSCULAR | Status: DC

## 2019-09-09 NOTE — Progress Notes (Signed)
Patient last received retacrit on 11/19 (Thursday), was previously getting on Mondays. No retacrit this week and resume weekly retacrit on 11/30 per Dr. Jana Hakim.   [name], PharmD, [facility] Pharmacist Pharmacy Phone: [phone] 09/09/2019

## 2019-09-09 NOTE — Patient Instructions (Addendum)
Azacitidine suspension for injection (subcutaneous use) What is this medicine? AZACITIDINE (ay za SITE i deen) is a chemotherapy drug. This medicine reduces the growth of cancer cells and can suppress the immune system. It is used for treating myelodysplastic syndrome or some types of leukemia. This medicine may be used for other purposes; ask your health care provider or pharmacist if you have questions. COMMON BRAND NAME(S): Vidaza What should I tell my health care provider before I take this medicine? They need to know if you have any of these conditions:  kidney disease  liver disease  liver tumors  an unusual or allergic reaction to azacitidine, mannitol, other medicines, foods, dyes, or preservatives  pregnant or trying to get pregnant  breast-feeding How should I use this medicine? This medicine is for injection under the skin. It is administered in a hospital or clinic by a specially trained health care professional. Talk to your pediatrician regarding the use of this medicine in children. While this drug may be prescribed for selected conditions, precautions do apply. Overdosage: If you think you have taken too much of this medicine contact a poison control center or emergency room at once. NOTE: This medicine is only for you. Do not share this medicine with others. What if I miss a dose? It is important not to miss your dose. Call your doctor or health care professional if you are unable to keep an appointment. What may interact with this medicine? Interactions have not been studied. Give your health care provider a list of all the medicines, herbs, non-prescription drugs, or dietary supplements you use. Also tell them if you smoke, drink alcohol, or use illegal drugs. Some items may interact with your medicine. This list may not describe all possible interactions. Give your health care provider a list of all the medicines, herbs, non-prescription drugs, or dietary supplements  you use. Also tell them if you smoke, drink alcohol, or use illegal drugs. Some items may interact with your medicine. What should I watch for while using this medicine? Visit your doctor for checks on your progress. This drug may make you feel generally unwell. This is not uncommon, as chemotherapy can affect healthy cells as well as cancer cells. Report any side effects. Continue your course of treatment even though you feel ill unless your doctor tells you to stop. In some cases, you may be given additional medicines to help with side effects. Follow all directions for their use. Call your doctor or health care professional for advice if you get a fever, chills or sore throat, or other symptoms of a cold or flu. Do not treat yourself. This drug decreases your body's ability to fight infections. Try to avoid being around people who are sick. This medicine may increase your risk to bruise or bleed. Call your doctor or health care professional if you notice any unusual bleeding. You may need blood work done while you are taking this medicine. Do not become pregnant while taking this medicine and for 6 months after the last dose. Women should inform their doctor if they wish to become pregnant or think they might be pregnant. Men should not father a child while taking this medicine and for 3 months after the last dose. There is a potential for serious side effects to an unborn child. Talk to your health care professional or pharmacist for more information. Do not breast-feed an infant while taking this medicine and for 1 week after the last dose. This medicine may interfere with   the ability to have a child. Talk with your doctor or health care professional if you are concerned about your fertility. What side effects may I notice from receiving this medicine? Side effects that you should report to your doctor or health care professional as soon as possible:  allergic reactions like skin rash, itching or  hives, swelling of the face, lips, or tongue  low blood counts - this medicine may decrease the number of white blood cells, red blood cells and platelets. You may be at increased risk for infections and bleeding.  signs of infection - fever or chills, cough, sore throat, pain passing urine  signs of decreased platelets or bleeding - bruising, pinpoint red spots on the skin, black, tarry stools, blood in the urine  signs of decreased red blood cells - unusually weak or tired, fainting spells, lightheadedness  signs and symptoms of kidney injury like trouble passing urine or change in the amount of urine  signs and symptoms of liver injury like dark yellow or brown urine; general ill feeling or flu-like symptoms; light-colored stools; loss of appetite; nausea; right upper belly pain; unusually weak or tired; yellowing of the eyes or skin Side effects that usually do not require medical attention (report to your doctor or health care professional if they continue or are bothersome):  constipation  diarrhea  nausea, vomiting  pain or redness at the injection site  unusually weak or tired This list may not describe all possible side effects. Call your doctor for medical advice about side effects. You may report side effects to FDA at 1-800-FDA-1088. Where should I keep my medicine? This drug is given in a hospital or clinic and will not be stored at home. NOTE: This sheet is a summary. It may not cover all possible information. If you have questions about this medicine, talk to your doctor, pharmacist, or health care provider.  2020 Elsevier/Gold Standard (2016-11-01 14:37:51)  

## 2019-09-09 NOTE — Telephone Encounter (Signed)
Per MD proceed with day 6 Vidiza despite abnormal labs.

## 2019-09-09 NOTE — Progress Notes (Signed)
Received call report for critical lab of Hgb 6.6.   Desk nurse/MD aware.

## 2019-09-10 ENCOUNTER — Telehealth: Payer: Self-pay | Admitting: *Deleted

## 2019-09-10 ENCOUNTER — Telehealth: Payer: Self-pay | Admitting: Oncology

## 2019-09-10 ENCOUNTER — Other Ambulatory Visit: Payer: Self-pay | Admitting: *Deleted

## 2019-09-10 DIAGNOSIS — D649 Anemia, unspecified: Secondary | ICD-10-CM

## 2019-09-10 LAB — FERRITIN: Ferritin: 1666 ng/mL — ABNORMAL HIGH (ref 24–336)

## 2019-09-10 NOTE — Telephone Encounter (Signed)
Scheduled appt per 11/23 sch message - called pt - no answer and unable to leave message. Let RN know to try and call pt.

## 2019-09-11 ENCOUNTER — Inpatient Hospital Stay: Payer: Medicare Other

## 2019-09-11 ENCOUNTER — Other Ambulatory Visit: Payer: Self-pay

## 2019-09-11 DIAGNOSIS — Z5111 Encounter for antineoplastic chemotherapy: Secondary | ICD-10-CM | POA: Diagnosis not present

## 2019-09-11 DIAGNOSIS — D649 Anemia, unspecified: Secondary | ICD-10-CM

## 2019-09-11 LAB — PREPARE RBC (CROSSMATCH)

## 2019-09-11 MED ORDER — ACETAMINOPHEN 325 MG PO TABS
ORAL_TABLET | ORAL | Status: AC
  Filled 2019-09-11: qty 2

## 2019-09-11 MED ORDER — ACETAMINOPHEN 325 MG PO TABS
650.0000 mg | ORAL_TABLET | Freq: Once | ORAL | Status: AC
  Administered 2019-09-11: 09:00:00 650 mg via ORAL

## 2019-09-11 MED ORDER — DIPHENHYDRAMINE HCL 25 MG PO CAPS
ORAL_CAPSULE | ORAL | Status: AC
  Filled 2019-09-11: qty 1

## 2019-09-11 MED ORDER — SODIUM CHLORIDE 0.9% IV SOLUTION
250.0000 mL | Freq: Once | INTRAVENOUS | Status: AC
  Administered 2019-09-11: 09:00:00 250 mL via INTRAVENOUS
  Filled 2019-09-11: qty 250

## 2019-09-11 MED ORDER — DIPHENHYDRAMINE HCL 25 MG PO CAPS
25.0000 mg | ORAL_CAPSULE | Freq: Once | ORAL | Status: AC
  Administered 2019-09-11: 09:00:00 25 mg via ORAL

## 2019-09-11 NOTE — Patient Instructions (Signed)
Blood Transfusion, Adult, Care After This sheet gives you information about how to care for yourself after your procedure. Your doctor may also give you more specific instructions. If you have problems or questions, contact your doctor. Follow these instructions at home:   Take over-the-counter and prescription medicines only as told by your doctor.  Go back to your normal activities as told by your doctor.  Follow instructions from your doctor about how to take care of the area where an IV tube was put into your vein (insertion site). Make sure you: ? Wash your hands with soap and water before you change your bandage (dressing). If there is no soap and water, use hand sanitizer. ? Change your bandage as told by your doctor.  Check your IV insertion site every day for signs of infection. Check for: ? More redness, swelling, or pain. ? More fluid or blood. ? Warmth. ? Pus or a bad smell. Contact a doctor if:  You have more redness, swelling, or pain around the IV insertion site.  You have more fluid or blood coming from the IV insertion site.  Your IV insertion site feels warm to the touch.  You have pus or a bad smell coming from the IV insertion site.  Your pee (urine) turns pink, red, or brown.  You feel weak after doing your normal activities. Get help right away if:  You have signs of a serious allergic or body defense (immune) system reaction, including: ? Itchiness. ? Hives. ? Trouble breathing. ? Anxiety. ? Pain in your chest or lower back. ? Fever, flushing, and chills. ? Fast pulse. ? Rash. ? Watery poop (diarrhea). ? Throwing up (vomiting). ? Dark pee. ? Serious headache. ? Dizziness. ? Stiff neck. ? Yellow color in your face or the white parts of your eyes (jaundice). Summary  After a blood transfusion, return to your normal activities as told by your doctor.  Every day, check for signs of infection where the IV tube was put into your vein.  Some  signs of infection are warm skin, more redness and pain, more fluid or blood, and pus or a bad smell where the needle went in.  Contact your doctor if you feel weak or have any unusual symptoms. This information is not intended to replace advice given to you by your health care provider. Make sure you discuss any questions you have with your health care provider. Document Released: 10/24/2014 Document Revised: 02/07/2018 Document Reviewed: 05/27/2016 Elsevier Patient Education  2020 [employer]  

## 2019-09-12 LAB — TYPE AND SCREEN
ABO/RH(D): A POS
Antibody Screen: NEGATIVE
Unit division: 0

## 2019-09-12 LAB — BPAM RBC
Blood Product Expiration Date: 202012062359
ISSUE DATE / TIME: 202011250926
Unit Type and Rh: 6200

## 2019-09-23 ENCOUNTER — Ambulatory Visit (INDEPENDENT_AMBULATORY_CARE_PROVIDER_SITE_OTHER): Payer: Medicare Other | Admitting: Podiatry

## 2019-09-23 ENCOUNTER — Encounter: Payer: Self-pay | Admitting: Podiatry

## 2019-09-23 ENCOUNTER — Other Ambulatory Visit: Payer: Self-pay

## 2019-09-23 DIAGNOSIS — M79674 Pain in right toe(s): Secondary | ICD-10-CM | POA: Diagnosis not present

## 2019-09-23 DIAGNOSIS — M79675 Pain in left toe(s): Secondary | ICD-10-CM

## 2019-09-23 DIAGNOSIS — B351 Tinea unguium: Secondary | ICD-10-CM | POA: Diagnosis not present

## 2019-09-23 NOTE — Patient Instructions (Signed)
Diabetes Mellitus and Foot Care Foot care is an important part of your health, especially when you have diabetes. Diabetes may cause you to have problems because of poor blood flow (circulation) to your feet and legs, which can cause your skin to:  Become thinner and drier.  Break more easily.  Heal more slowly.  Peel and crack. You may also have nerve damage (neuropathy) in your legs and feet, causing decreased feeling in them. This means that you may not notice minor injuries to your feet that could lead to more serious problems. Noticing and addressing any potential problems early is the best way to prevent future foot problems. How to care for your feet Foot hygiene  Wash your feet daily with warm water and mild soap. Do not use hot water. Then, pat your feet and the areas between your toes until they are completely dry. Do not soak your feet as this can dry your skin.  Trim your toenails straight across. Do not dig under them or around the cuticle. File the edges of your nails with an emery board or nail file.  Apply a moisturizing lotion or petroleum jelly to the skin on your feet and to dry, brittle toenails. Use lotion that does not contain alcohol and is unscented. Do not apply lotion between your toes. Shoes and socks  Wear clean socks or stockings every day. Make sure they are not too tight. Do not wear knee-high stockings since they may decrease blood flow to your legs.  Wear shoes that fit properly and have enough cushioning. Always look in your shoes before you put them on to be sure there are no objects inside.  To break in new shoes, wear them for just a few hours a day. This prevents injuries on your feet. Wounds, scrapes, corns, and calluses  Check your feet daily for blisters, cuts, bruises, sores, and redness. If you cannot see the bottom of your feet, use a mirror or ask someone for help.  Do not cut corns or calluses or try to remove them with medicine.  If you  find a minor scrape, cut, or break in the skin on your feet, keep it and the skin around it clean and dry. You may clean these areas with mild soap and water. Do not clean the area with peroxide, alcohol, or iodine.  If you have a wound, scrape, corn, or callus on your foot, look at it several times a day to make sure it is healing and not infected. Check for: ? Redness, swelling, or pain. ? Fluid or blood. ? Warmth. ? Pus or a bad smell. General instructions  Do not cross your legs. This may decrease blood flow to your feet.  Do not use heating pads or hot water bottles on your feet. They may burn your skin. If you have lost feeling in your feet or legs, you may not know this is happening until it is too late.  Protect your feet from hot and cold by wearing shoes, such as at the beach or on hot pavement.  Schedule a complete foot exam at least once a year (annually) or more often if you have foot problems. If you have foot problems, report any cuts, sores, or bruises to your health care provider immediately. Contact a health care provider if:  You have a medical condition that increases your risk of infection and you have any cuts, sores, or bruises on your feet.  You have an injury that is not   healing.  You have redness on your legs or feet.  You feel burning or tingling in your legs or feet.  You have pain or cramps in your legs and feet.  Your legs or feet are numb.  Your feet always feel cold.  You have pain around a toenail. Get help right away if:  You have a wound, scrape, corn, or callus on your foot and: ? You have pain, swelling, or redness that gets worse. ? You have fluid or blood coming from the wound, scrape, corn, or callus. ? Your wound, scrape, corn, or callus feels warm to the touch. ? You have pus or a bad smell coming from the wound, scrape, corn, or callus. ? You have a fever. ? You have a red line going up your leg. Summary  Check your feet every day  for cuts, sores, red spots, swelling, and blisters.  Moisturize feet and legs daily.  Wear shoes that fit properly and have enough cushioning.  If you have foot problems, report any cuts, sores, or bruises to your health care provider immediately.  Schedule a complete foot exam at least once a year (annually) or more often if you have foot problems. This information is not intended to replace advice given to you by your health care provider. Make sure you discuss any questions you have with your health care provider. Document Released: 09/30/2000 Document Revised: 11/15/2017 Document Reviewed: 11/04/2016 Elsevier Patient Education  2020 Elsevier Inc.  

## 2019-09-28 NOTE — Progress Notes (Signed)
Subjective:  [name] presents to clinic for preventative diabetic foot care on today. Pt has painful mycotic toenail which are aggravated when wearing enclosed shoe gear and relieved with periodic professional debridement.  [name] states he has been in and out of the hospital since June for precancer treatments due to antirejection medications for kidney transplant.  Allergies  Allergen Reactions  . Codeine Nausea And Vomiting and Nausea Only  . Doxycycline Nausea And Vomiting and Nausea Only  . Latex Hives and Rash  . Tape Other (See Comments)    The "plastic, waffle-printed" tape tears and bruises the skin     Objective:  Physical Examination:  Vascular Examination: Capillary refill time to digits <3 seconds b/l.  Palpable DP/PT pulses b/l.  Digital hair absent b/l.   No edema noted b/l.  Skin temperature gradient WNL b/l.  Dermatological Examination: Skin with normal turgor, texture and tone b/l.  No open wounds b/l.  No interdigital macerations noted b/l.  Resolved corns left 2nd, right 3rd digits.  Elongated, thick, discolored brittle toenails with subungual debris and pain on dorsal palpation of nailbeds 1-5 b/l.  Musculoskeletal Examination: Muscle strength 5/5 to all muscle groups b/l.  HAV with bunion b/l.  No pain, crepitus or joint discomfort with active/passive ROM.  Neurological Examination: Sensation intact 5/5 b/l with 10 gram monofilament.  Vibratory sensation intact b/l.  Assessment: Mycotic nail infection with pain 1-5 b/l  Plan: 1. Toenails 1-5 b/l were debrided in length and girth without iatrogenic laceration. 2.  Continue soft, supportive shoe gear daily. 3.  Report any pedal injuries to medical professional. 4.  Follow up 3 months. 5.  Patient/POA to call should there be a question/concern in there interim.

## 2019-10-02 ENCOUNTER — Other Ambulatory Visit (HOSPITAL_COMMUNITY): Payer: Self-pay | Admitting: *Deleted

## 2019-10-03 ENCOUNTER — Other Ambulatory Visit: Payer: Self-pay

## 2019-10-03 ENCOUNTER — Ambulatory Visit (HOSPITAL_COMMUNITY)
Admission: RE | Admit: 2019-10-03 | Discharge: 2019-10-03 | Disposition: A | Discharge status: Home / Self Care | Payer: Medicare Other | Source: Ambulatory Visit | Attending: Nephrology | Admitting: Nephrology

## 2019-10-03 DIAGNOSIS — E1122 Type 2 diabetes mellitus with diabetic chronic kidney disease: Secondary | ICD-10-CM | POA: Insufficient documentation

## 2019-10-03 DIAGNOSIS — N186 End stage renal disease: Secondary | ICD-10-CM | POA: Insufficient documentation

## 2019-10-03 DIAGNOSIS — D631 Anemia in chronic kidney disease: Secondary | ICD-10-CM | POA: Insufficient documentation

## 2019-10-03 LAB — PREPARE RBC (CROSSMATCH)

## 2019-10-03 MED ORDER — ACETAMINOPHEN 325 MG PO TABS
650.0000 mg | ORAL_TABLET | Freq: Once | ORAL | Status: AC
  Administered 2019-10-03: 650 mg via ORAL

## 2019-10-03 MED ORDER — SODIUM CHLORIDE 0.9% IV SOLUTION: Freq: Once | INTRAVENOUS | Status: DC

## 2019-10-03 MED ORDER — DIPHENHYDRAMINE HCL 25 MG PO CAPS
25.0000 mg | ORAL_CAPSULE | Freq: Once | ORAL | Status: AC
  Administered 2019-10-03: 10:00:00 25 mg via ORAL

## 2019-10-03 MED ORDER — ACETAMINOPHEN 325 MG PO TABS
ORAL_TABLET | ORAL | Status: AC
  Filled 2019-10-03: qty 2

## 2019-10-03 MED ORDER — DIPHENHYDRAMINE HCL 25 MG PO CAPS
ORAL_CAPSULE | ORAL | Status: AC
  Filled 2019-10-03: qty 1

## 2019-10-04 LAB — TYPE AND SCREEN
ABO/RH(D): A POS
Antibody Screen: NEGATIVE
Unit division: 0
Unit division: 0

## 2019-10-04 LAB — BPAM RBC
Blood Product Expiration Date: 202101062359
Blood Product Expiration Date: 202101062359
ISSUE DATE / TIME: 202012170945
ISSUE DATE / TIME: 202012171242
Unit Type and Rh: 6200
Unit Type and Rh: 6200

## 2019-10-21 ENCOUNTER — Other Ambulatory Visit: Payer: Self-pay | Admitting: *Deleted

## 2019-10-21 DIAGNOSIS — D631 Anemia in chronic kidney disease: Secondary | ICD-10-CM

## 2019-10-21 DIAGNOSIS — N184 Chronic kidney disease, stage 4 (severe): Secondary | ICD-10-CM

## 2019-10-22 ENCOUNTER — Other Ambulatory Visit: Payer: Self-pay

## 2019-10-22 ENCOUNTER — Inpatient Hospital Stay: Payer: Medicare Other

## 2019-10-22 ENCOUNTER — Inpatient Hospital Stay (HOSPITAL_BASED_OUTPATIENT_CLINIC_OR_DEPARTMENT_OTHER): Payer: Medicare Other | Admitting: Adult Health

## 2019-10-22 ENCOUNTER — Telehealth: Payer: Self-pay | Admitting: *Deleted

## 2019-10-22 ENCOUNTER — Encounter: Payer: Self-pay | Admitting: Adult Health

## 2019-10-22 ENCOUNTER — Inpatient Hospital Stay: Payer: Medicare Other | Attending: Oncology

## 2019-10-22 VITALS — BP 128/57 | HR 77 | Temp 98.3°F | Resp 17

## 2019-10-22 DIAGNOSIS — D61818 Other pancytopenia: Secondary | ICD-10-CM | POA: Diagnosis present

## 2019-10-22 DIAGNOSIS — I158 Other secondary hypertension: Secondary | ICD-10-CM

## 2019-10-22 DIAGNOSIS — D696 Thrombocytopenia, unspecified: Secondary | ICD-10-CM

## 2019-10-22 DIAGNOSIS — C9500 Acute leukemia of unspecified cell type not having achieved remission: Secondary | ICD-10-CM | POA: Insufficient documentation

## 2019-10-22 DIAGNOSIS — D469 Myelodysplastic syndrome, unspecified: Secondary | ICD-10-CM

## 2019-10-22 DIAGNOSIS — D702 Other drug-induced agranulocytosis: Secondary | ICD-10-CM

## 2019-10-22 DIAGNOSIS — Z940 Kidney transplant status: Secondary | ICD-10-CM | POA: Diagnosis not present

## 2019-10-22 DIAGNOSIS — D631 Anemia in chronic kidney disease: Secondary | ICD-10-CM

## 2019-10-22 DIAGNOSIS — E1122 Type 2 diabetes mellitus with diabetic chronic kidney disease: Secondary | ICD-10-CM | POA: Diagnosis not present

## 2019-10-22 DIAGNOSIS — D649 Anemia, unspecified: Secondary | ICD-10-CM

## 2019-10-22 DIAGNOSIS — E113513 Type 2 diabetes mellitus with proliferative diabetic retinopathy with macular edema, bilateral: Secondary | ICD-10-CM

## 2019-10-22 DIAGNOSIS — N186 End stage renal disease: Secondary | ICD-10-CM | POA: Insufficient documentation

## 2019-10-22 LAB — COMPREHENSIVE METABOLIC PANEL
ALT: 28 U/L (ref 0–44)
AST: 113 U/L — ABNORMAL HIGH (ref 15–41)
Albumin: 3.3 g/dL — ABNORMAL LOW (ref 3.5–5.0)
Alkaline Phosphatase: 278 U/L — ABNORMAL HIGH (ref 38–126)
Anion gap: 10 (ref 5–15)
BUN: 20 mg/dL (ref 8–23)
CO2: 24 mmol/L (ref 22–32)
Calcium: 11.1 mg/dL — ABNORMAL HIGH (ref 8.9–10.3)
Chloride: 103 mmol/L (ref 98–111)
Creatinine, Ser: 1.32 mg/dL — ABNORMAL HIGH (ref 0.61–1.24)
GFR calc Af Amer: >60 mL/min (ref >60)
GFR calc non Af Amer: 54 mL/min — ABNORMAL LOW (ref >60)
Glucose, Bld: 222 mg/dL — ABNORMAL HIGH (ref 70–99)
Potassium: 4 mmol/L (ref 3.5–5.1)
Sodium: 137 mmol/L (ref 135–145)
Total Bilirubin: 2.9 mg/dL — ABNORMAL HIGH (ref 0.3–1.2)
Total Protein: 6.7 g/dL (ref 6.5–8.1)

## 2019-10-22 LAB — CBC WITH DIFFERENTIAL (CANCER CENTER ONLY)
Abs Immature Granulocytes: 1 10*3/uL — ABNORMAL HIGH (ref 0.00–0.07)
Band Neutrophils: 4 %
Basophils Absolute: 0 10*3/uL (ref 0.0–0.1)
Basophils Relative: 0 %
Eosinophils Absolute: 0.1 10*3/uL (ref 0.0–0.5)
Eosinophils Relative: 1 %
HCT: 20 % — ABNORMAL LOW (ref 39.0–52.0)
Hemoglobin: 6.3 g/dL — CL (ref 13.0–17.0)
Lymphocytes Relative: 24 %
Lymphs Abs: 3.6 10*3/uL (ref 0.7–4.0)
MCH: 34.2 pg — ABNORMAL HIGH (ref 26.0–34.0)
MCHC: 31.5 g/dL (ref 30.0–36.0)
MCV: 108.7 fL — ABNORMAL HIGH (ref 80.0–100.0)
Metamyelocytes Relative: 6 %
Monocytes Absolute: 2.8 10*3/uL — ABNORMAL HIGH (ref 0.1–1.0)
Monocytes Relative: 19 %
Myelocytes: 1 %
Neutro Abs: 4.4 10*3/uL (ref 1.7–7.7)
Neutrophils Relative %: 26 %
Other: 19 %
Platelet Count: 48 10*3/uL — ABNORMAL LOW (ref 150–400)
RBC: 1.84 MIL/uL — ABNORMAL LOW (ref 4.22–5.81)
RDW: 28.5 % — ABNORMAL HIGH (ref 11.5–15.5)
Smear Review: 19
WBC Count: 14.8 10*3/uL — ABNORMAL HIGH (ref 4.0–10.5)
nRBC: 17.1 % — ABNORMAL HIGH (ref 0.0–0.2)

## 2019-10-22 LAB — FERRITIN: Ferritin: 16422 ng/mL — ABNORMAL HIGH (ref 24–336)

## 2019-10-22 LAB — URIC ACID: Uric Acid, Serum: 10.3 mg/dL — ABNORMAL HIGH (ref 3.7–8.6)

## 2019-10-22 LAB — LACTATE DEHYDROGENASE: LDH: 3230 U/L — ABNORMAL HIGH (ref 98–192)

## 2019-10-22 LAB — SAMPLE TO BLOOD BANK

## 2019-10-22 LAB — RETICULOCYTES
Immature Retic Fract: 31.2 % — ABNORMAL HIGH (ref 2.3–15.9)
RBC.: 1.87 MIL/uL — ABNORMAL LOW (ref 4.22–5.81)
Retic Count, Absolute: 87.9 10*3/uL (ref 19.0–186.0)
Retic Ct Pct: 4.7 % — ABNORMAL HIGH (ref 0.4–3.1)

## 2019-10-22 LAB — PREPARE RBC (CROSSMATCH)

## 2019-10-22 MED ORDER — SODIUM CHLORIDE 0.9% IV SOLUTION
250.0000 mL | Freq: Once | INTRAVENOUS | Status: DC
  Filled 2019-10-22: qty 250

## 2019-10-22 NOTE — Telephone Encounter (Signed)
Per MD- referral called to [facility]/hospice for admission with request for bed at [facility] ASAP due to expected death within 2 weeks.  Order faxed to [facility].

## 2019-10-22 NOTE — Progress Notes (Addendum)
[name]  [phone] [phone]    ID: [name] DOB: 1946-04-22  MR#: [id number]  XLK#:[id number]  Patient Care Team: Rogers Blocker, MD as PCP - General (Internal Medicine) Magrinat, Virgie Dad, MD as Consulting Physician (Oncology) Donato Heinz, MD as Consulting Physician (Nephrology) Ander Slade, Carlisle Beers, MD as Referring Physician (Ophthalmology) [name] Erskine Squibb, MD as Referring Physician (Vascular Surgery) Brantley Fling, MD as Referring Physician (Internal Medicine) OTHER MD:  CHIEF COMPLAINT: MDS  CURRENT TREATMENT: comfort care   INTERVAL HISTORY: [name] returns today for 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   His most recent blood transfusion was 09/11/2019.  He was last treated with Azacytidine beginning on 09/02/2019.  He was also seen by Dr. Florene Glen who recommended to continue his treatment, with the goals of care in mind for quality of life and to decrease transfusions.    Since his last visit, he has slowly worsened.  His wife, "[name]" notes that he is increasingly lethargic and has not "perked up" since his blood transfusion prior to thanksgiving.  Due to this, she brought him for evaluation to [place] earlier than his scheduled appointment next week.  She notes that [name] had been requesting not to come for evaluation.    REVIEW OF SYSTEMS: [name] has lost some weight.  He is eating minimal amounts of food, and his wife is encouraging fluids.  He is urinating, and his urine has darkened some.  He has not had fever or chills, however he and his wife note that he has had increased night sweats.  He has generalized weakness.    He was on antibiotics about 3 weeks ago for cutting his lip.  This has healed, and he has no other skin lesions/signs of infection.  He has completed antibiotics.  A detailed ROS was otherwise non contributory.     HISTORY OF CURRENT ILLNESS: From the original intake note:  [name] is a 74 year old [place] man with a history of type 2 diabetes complicated by significant retinopathy and end-stage renal disease, status post renal transplant, followed by Dr. Marval Regal.  [name] notes that he was feeling more fatigued than usual, and that last time he felt this way, his Vitamin D levels were low. He called his doctors office to ask for his blood work to be ran before he was sent to the ED by Dr. Marlou Sa. The patient presented to the emergency room 03/22/2019 with symptomatic anemia, was found to have hemoglobin of 5.9 and pancytopenia.  Additional work-up is listed below in the assessment section, but there was no evidence of B12, folate or iron deficiency and reticulocyte count was inappropriately normal.  HIV and SARS coronavirus 2 tests were negative.  Creatinine was 1.49 with GFR was 54.  Glucose was 221.  Other labs of interest obtained 03/23/2019 included a B12 level of 944, folate 13.2, ferritin 1060, with serum iron 134, saturation 41%.  The absolute reticulocyte count was 64.5.  His end-stage renal disease is felt to be due to diabetes and hypertension.  He was on dialysis starting 2010 but on 03/31/2015 he underwent a cadaveric renal transplant at [facility].  He had transient CMV viremia, which was treated.  He continues on Bactrim prophylaxis and tacrolimus.  He also receives mycophenolate and when he had the episode of severe anemia and a mycophenolate was decreased in half and his Bactrim was decreased to Monday Wednesday Friday.  His tacrolimus level was on the high  side.  This has been since adjusted by Dr. Marval Regal and is being repeated today  The patient was transfused and discharged and referred here for evaluation of pancytopenia   PAST MEDICAL HISTORY: Past Medical History:  Diagnosis Date  . Anemia    hx of at beginnning of dialysis- 05/2009  . Arthritis    right hip   . End stage renal disease ([place])    [place] [facility]   . End stage renal disease on dialysis ([place])   . Essential hypertension, malignant   . Hypertrophy of prostate without urinary obstruction and other lower urinary tract symptoms (LUTS)   . Myelodysplasia (myelodysplastic syndrome) ([place]) 05/20/2019  . Pure hypercholesterolemia   . Renal insufficiency   . Type II or unspecified type diabetes mellitus without mention of complication, not stated as uncontrolled    TYPE 2      PAST SURGICAL HISTORY: Past Surgical History:  Procedure Laterality Date  . A/V FISTULAGRAM Left 06/12/2017   Procedure: A/V Fistulagram;  Surgeon: Algernon Huxley, MD;  Location: [facility];  Service: Cardiovascular;  Laterality: Left;  . CARDIAC CATHETERIZATION  04/29/10   normal coronaries, LVEF 45%, mild global LV hypokinesis ([place])  . CATARACT EXTRACTION W/PHACO Right 04/03/2013   Procedure: CATARACT EXTRACTION PHACO AND INTRAOCULAR LENS PLACEMENT (IOC);  Surgeon: Adonis Brook, MD;  Location: [facility];  Service: Ophthalmology;  Laterality: Right;  . CERVICAL DISCECTOMY  03/1999  . COLONOSCOPY     Hx: of  . CYSTOSCOPY WITH BIOPSY  11/01/2012   Procedure: CYSTOSCOPY WITH BIOPSY;  Surgeon: Molli Hazard, MD;  Location: [facility];  Service: Urology;  Laterality: N/A;  with fulgeration  . CYSTOSCOPY WITH RETROGRADE PYELOGRAM, URETEROSCOPY AND STENT PLACEMENT  11/01/2012   Procedure: CYSTOSCOPY WITH RETROGRADE PYELOGRAM, URETEROSCOPY AND STENT PLACEMENT;  Surgeon: Molli Hazard, MD;  [facility];  Service: Urology;  Laterality: Bilateral;  . DIALYSIS FISTULA CREATION  2010  . EYE SURGERY  2012   cataracts  . EYE SURGERY  11/2014  . HOLMIUM LASER APPLICATION  10/31/7260   Procedure: HOLMIUM LASER APPLICATION;  Surgeon: Molli Hazard, MD;  [facility];  Service: Urology;  Laterality: Bilateral;  . KIDNEY TRANSPLANT  2016  . MEMBRANE PEEL Right 09/04/2013   Procedure: MEMBRANE PEEL;  Surgeon: Adonis Brook, MD;   Location: [facility];  Service: Ophthalmology;  Laterality: Right;  . NECK SURGERY  1990  . PARS PLANA VITRECTOMY Right 09/04/2013   Procedure: PARS PLANA VITRECTOMY WITH 23 GAUGE;  Surgeon: Adonis Brook, MD;  Location: [facility];  Service: Ophthalmology;  Laterality: Right;  . PARS PLANA VITRECTOMY Left 02/17/2015  . PARS PLANA VITRECTOMY Left 02/17/2015   Procedure: PARS PLANA VITRECTOMY WITH 25G REMOVAL/SUTURE INTRAOCULAR LENS;  Surgeon: Hayden Pedro, MD;  Location: [facility];  Service: Ophthalmology;  Laterality: Left;  . PERIPHERAL VASCULAR CATHETERIZATION Left 08/08/2016   Procedure: A/V Shuntogram/Fistulagram;  Surgeon: Algernon Huxley, MD;  Location: [facility];  Service: Cardiovascular;  Laterality: Left;  . PERIPHERAL VASCULAR CATHETERIZATION N/A 08/08/2016   Procedure: A/V Shunt Intervention;  Surgeon: Algernon Huxley, MD;  Location: [facility];  Service: Cardiovascular;  Laterality: N/A;  . PHOTOCOAGULATION WITH LASER Right 09/04/2013   Procedure: PHOTOCOAGULATION WITH LASER;  Surgeon: Adonis Brook, MD;  Location: [facility];  Service: Ophthalmology;  Laterality: Right;  ENDOLASER  . TRANSTHORACIC ECHOCARDIOGRAM  06/19/2012   normal LV sys function, EF 55-60%, mild to  mod diastolic dysfunction, mild AI ([place])  tonsillectomy @ 74 years of age   FAMILY HISTORY: Family History  Problem Relation Age of Onset  . Breast cancer Mother 51       deceased  . Pneumonia Father 27       deceased  . Hypertension Brother   . Diabetes Mellitus II Brother   . Cancer Sister        Intestinal   The patient's s father died from pneumonia at age 45.  He was originally a Psychologist, sport and exercise and later worked in a Designer, television/film set.  Patients' mother died from breast cancer at age 10. The patient has 7 siblings. Patient denies anyone in her family having breast, ovarian, prostate, or pancreatic cancer.  1 sister died from intestinal cancer.  Another sister, Katharine Look, died from an aneurism.     SOCIAL  HISTORY: (Current as of 04/22/2019) Mr. Neilan retired from the [employer] office. 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 He notes that he was exposed to agent orange. His wife, [name], is a retired Corporate treasurer. They have two children, [name] and [name]. [name] lives in [place] and works at the [employer] for [employer]. 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  His daughter is expecting in August 2020. The patient he belongs to [employer] on [place]: In the absence of any documentation, [name]'s spouse is automatically his healthcare power of attorney.     HEALTH MAINTENANCE: Social History   Tobacco Use  . Smoking status: Former Smoker    Types: Cigarettes    Quit date: 10/02/1990    Years since quitting: 29.0  . Smokeless tobacco: Never Used  Substance Use Topics  . Alcohol use: Yes    Comment: occasional  . Drug use: No    Colonoscopy: up to date/ [facility]  Allergies  Allergen Reactions  . Codeine Nausea And Vomiting and Nausea Only  . Doxycycline Nausea And Vomiting and Nausea Only  . Latex Hives and Rash  . Tape Other (See Comments)    The "plastic, waffle-printed" tape tears and bruises the skin    Current Outpatient Medications  Medication Sig Dispense Refill  . acetaminophen (TYLENOL) 325 MG tablet Take 650 mg by mouth every 6 (six) hours as needed (for pain or headaches).     [name] allopurinol (ZYLOPRIM) 100 MG tablet Take 1 tablet (100 mg total) by mouth daily. 90 tablet 1  . amLODipine (NORVASC) 5 MG tablet Take 1 tablet (5 mg total) by mouth daily. 30 tablet 0  . betamethasone dipropionate (DIPROLENE) 0.05 % ointment     . cephALEXin (KEFLEX) 250 MG capsule Take 1 capsule (250 mg total) by mouth 2 (two) times daily. 12 capsule 0  . cloNIDine (CATAPRES) 0.1 MG tablet Take 0.1 mg by mouth 3 (three) times daily.    [name] donepezil (ARICEPT) 10 MG  tablet Take 10 mg by mouth at bedtime.    . dorzolamide (TRUSOPT) 2 % ophthalmic solution Place 1 drop into both eyes 2 (two) times a day.     . doxazosin (CARDURA) 4 MG tablet Take 4 mg by mouth 2 (two) times daily.    . folic acid (FOLVITE) 1 MG tablet Take 1 tablet (1 mg total) by mouth daily. 90 tablet 4  . furosemide (LASIX) 40 MG tablet Take 40-80 mg by mouth See admin instructions. Take 80 mg  by mouth in the morning and 40 mg in the evening    . HUMALOG KWIKPEN 100 UNIT/ML KiwkPen Inject 8-16 Units into the skin See admin instructions. Inject 8-16 units into the skin three times a day before meals, PER SLIDING SCALE (BGL 150 or less, use nothing)  3  . Insulin Glargine (LANTUS SOLOSTAR) 100 UNIT/ML Solostar Pen Inject 34 Units into the skin daily before breakfast.     . K Phos Mono-Sod Phos Di & Mono (PHOSPHA 250 NEUTRAL) 300-762-263 MG TABS Take 1-2 tablets by mouth See admin instructions. Take 2 tablets by mouth in the morning and 1 tablet at bedtime at 8 PM    . ketoconazole (NIZORAL) 2 % shampoo Apply 1 application topically every 7 (seven) days.   2  . KLOR-CON M20 20 MEQ tablet Take 20 mEq by mouth daily.  1  . latanoprost (XALATAN) 0.005 % ophthalmic solution Place 1 drop into both eyes at bedtime.     [name] linagliptin (TRADJENTA) 5 MG TABS tablet Take 5 mg by mouth daily with breakfast.     . losartan (COZAAR) 100 MG tablet Take 100 mg by mouth daily.    . metoprolol tartrate (LOPRESSOR) 25 MG tablet Take 25 mg by mouth 2 (two) times daily.    . Multiple Vitamins-Minerals (ONE-A-DAY MENS 50+ ADVANTAGE) TABS Take 1 tablet by mouth daily with breakfast.    . mupirocin cream (BACTROBAN) 2 % Apply 1 application topically 2 (two) times daily. 15 g 0  . mycophenolate (MYFORTIC) 180 MG EC tablet Take 1 tablet (180 mg total) by mouth 2 (two) times daily. 60 tablet 0  . NON FORMULARY Apply 1 application topically See admin instructions. Skin-So-Soft lotion: Apply as needed to dry sites of skin     . ondansetron (ZOFRAN) 8 MG tablet Take 1 tablet (8 mg total) by mouth 2 (two) times daily as needed (Nausea or vomiting). 30 tablet 1  . sodium chloride (OCEAN) 0.65 % SOLN nasal spray Place 2 sprays into both nostrils as needed for congestion.     . sulfamethoxazole-trimethoprim (BACTRIM) 400-80 MG tablet Take 1 tablet by mouth every Monday, Wednesday, and Friday.    . tacrolimus (PROGRAF) 0.5 MG capsule Take 0.5 mg by mouth 2 (two) times daily.    . tacrolimus (PROGRAF) 1 MG capsule Take 3 mg by mouth 2 (two) times daily.   11  . tadalafil (CIALIS) 20 MG tablet Take 20 mg by mouth daily as needed for erectile dysfunction.     . triamcinolone ointment (KENALOG) 0.1 % APPLY ON THE SKIN EVERY OTHER DAY. APPLY TO SCALP AS DIRECTED. TAPER USE AS ABLE    . VIGAMOX 0.5 % ophthalmic solution INSTILL 1 DROP INTO RIGHT EYE 4 TIMES A DAY    . Vitamin D, Ergocalciferol, (DRISDOL) 50000 units CAPS capsule Take 50,000 Units by mouth every 7 (seven) days.      No current facility-administered medications for this visit.     OBJECTIVE: Older African-American man using a cane  Vitals:   10/22/19 1131  BP: (!) 128/57  Pulse: 77  Resp: 17  Temp: 98.3 F (36.8 C)  SpO2: 99%   Wt Readings from Last 3 Encounters:  10/03/19 193 lb 12.8 oz (87.9 kg)  09/09/19 194 lb 4 oz (88.1 kg)  09/02/19 194 lb 4.8 oz (88.1 kg)   There is no height or weight on file to calculate BMI.    ECOG FS:2 - Symptomatic, <50% confined to bed GENERAL: Patient  appears lethargic and acutely ill, examined in wheel chair HEENT:  Sclerae anicteric.  Oropharynx clear and moist. No ulcerations or evidence of oropharyngeal candidiasis. Neck is supple.  LUNGS:  Clear to auscultation bilaterally.  No wheezes or rhonchi. HEART:  Regular rate and rhythm. No murmur appreciated. ABDOMEN:  Soft, nontender.  Positive, normoactive bowel sounds. EXTREMITIES:  No peripheral edema.   SKIN:  Ashen, no rash or lesion noted NEURO:  Lethargic,  answers questions, however falls asleep in the middle of discussions.    LAB RESULTS:   CMP     Component Value Date/Time   NA 142 09/09/2019 1459   NA 135 (L) 07/04/2013 1256   K 3.9 09/09/2019 1459   K 4.4 07/11/2013 1108   CL 105 09/09/2019 1459   CL 96 (L) 07/04/2013 1256   CO2 26 09/09/2019 1459   CO2 32 07/04/2013 1256   GLUCOSE 91 09/09/2019 1459   GLUCOSE 189 (H) 07/04/2013 1256   BUN 19 09/09/2019 1459   BUN 26 (H) 07/04/2013 1256   CREATININE 1.30 (H) 09/09/2019 1459   CREATININE 1.35 (H) 04/26/2019 1455   CREATININE 10.86 (H) 07/04/2013 1256   CALCIUM 9.7 09/09/2019 1459   CALCIUM 8.8 07/04/2013 1256   PROT 6.4 (L) 09/09/2019 1459   ALBUMIN 3.8 09/09/2019 1459   AST 16 09/09/2019 1459   AST 23 04/26/2019 1455   ALT 10 09/09/2019 1459   ALT 19 04/26/2019 1455   ALKPHOS 114 09/09/2019 1459   BILITOT 1.6 (H) 09/09/2019 1459   BILITOT 0.9 04/26/2019 1455   GFRNONAA 55 (L) 09/09/2019 1459   GFRNONAA 53 (L) 04/26/2019 1455   GFRNONAA 4 (L) 07/04/2013 1256   GFRAA >60 09/09/2019 1459   GFRAA >60 04/26/2019 1455   GFRAA 5 (L) 07/04/2013 1256    No results found for: TOTALPROTELP, ALBUMINELP, A1GS, A2GS, BETS, BETA2SER, GAMS, MSPIKE, SPEI  No results found for: KPAFRELGTCHN, LAMBDASER, KAPLAMBRATIO  Lab Results  Component Value Date   WBC 14.8 (H) 10/22/2019   NEUTROABS PENDING 10/22/2019   HGB 6.3 (LL) 10/22/2019   HCT 20.0 (L) 10/22/2019   MCV 108.7 (H) 10/22/2019   PLT 48 (L) 10/22/2019    No results found for: LABCA2  No components found for: GLOVFI433  No results for input(s): INR in the last 168 hours.  No results found for: LABCA2  No results found for: IRJ188  No results found for: CZY606  No results found for: TKZ601  No results found for: CA2729  No components found for: HGQUANT  No results found for: CEA1 / No results found for: CEA1   No results found for: AFPTUMOR  No results found for: CHROMOGRNA  No results found for:  PSA1  Appointment on 10/22/2019  Component Date Value Ref Range Status  . Retic Ct Pct 10/22/2019 4.7* 0.4 - 3.1 % Final  . RBC. 10/22/2019 1.87* 4.22 - 5.81 MIL/uL Final  . Retic Count, Absolute 10/22/2019 87.9  19.0 - 186.0 K/uL Final  . Immature Retic Fract 10/22/2019 31.2* 2.3 - 15.9 % Final   Performed at [facility], [place] [address] [place], [place] [address]  . WBC Count 10/22/2019 14.8* 4.0 - 10.5 K/uL Final  . RBC 10/22/2019 1.84* 4.22 - 5.81 MIL/uL Final  . Hemoglobin 10/22/2019 6.3* 13.0 - 17.0 g/dL Final   This critical result has verified and been called to [facility] RN by Anda Latina on 01 05 2021 at 1124, and has been read back.   [name]  HCT 10/22/2019 20.0* 39.0 - 52.0 % Final  . MCV 10/22/2019 108.7* 80.0 - 100.0 fL Final  . MCH 10/22/2019 34.2* 26.0 - 34.0 pg Final  . MCHC 10/22/2019 31.5  30.0 - 36.0 g/dL Final  . RDW 10/22/2019 28.5* 11.5 - 15.5 % Final  . Platelet Count 10/22/2019 48* 150 - 400 K/uL Final   Comment: Immature Platelet Fraction may be clinically indicated, consider ordering this additional test [id number]   . nRBC 10/22/2019 17.1* 0.0 - 0.2 % Final   Performed at [facility], [place] [address] [place], [place] [address]  . Neutrophils Relative % 10/22/2019 PENDING  % Incomplete  . Neutro Abs 10/22/2019 PENDING  1.7 - 7.7 K/uL Incomplete  . Band Neutrophils 10/22/2019 PENDING  % Incomplete  . Lymphocytes Relative 10/22/2019 PENDING  % Incomplete  . Lymphs Abs 10/22/2019 PENDING  0.7 - 4.0 K/uL Incomplete  . Monocytes Relative 10/22/2019 PENDING  % Incomplete  . Monocytes Absolute 10/22/2019 PENDING  0.1 - 1.0 K/uL Incomplete  . Eosinophils Relative 10/22/2019 PENDING  % Incomplete  . Eosinophils Absolute 10/22/2019 PENDING  0.0 - 0.5 K/uL Incomplete  . Basophils Relative 10/22/2019 PENDING  % Incomplete  . Basophils Absolute 10/22/2019 PENDING  0.0 - 0.1 K/uL Incomplete  . WBC Morphology 10/22/2019  PENDING   Incomplete  . RBC Morphology 10/22/2019 PENDING   Incomplete  . Smear Review 10/22/2019 PENDING   Incomplete  . Other 10/22/2019 PENDING  % Incomplete  . nRBC 10/22/2019 PENDING  0 /100 WBC Incomplete  . Metamyelocytes Relative 10/22/2019 PENDING  % Incomplete  . Myelocytes 10/22/2019 PENDING  % Incomplete  . Promyelocytes Relative 10/22/2019 PENDING  % Incomplete  . Blasts 10/22/2019 PENDING  % Incomplete  . Immature Granulocytes 10/22/2019 PENDING  % Incomplete  . Abs Immature Granulocytes 10/22/2019 PENDING  0.00 - 0.07 K/uL Incomplete    (this displays the last labs from the last 3 days)  No results found for: TOTALPROTELP, ALBUMINELP, A1GS, A2GS, BETS, BETA2SER, GAMS, MSPIKE, SPEI (this displays SPEP labs)  No results found for: KPAFRELGTCHN, LAMBDASER, KAPLAMBRATIO (kappa/lambda light chains)  No results found for: HGBA, HGBA2QUANT, HGBFQUANT, HGBSQUAN (Hemoglobinopathy evaluation)   Lab Results  Component Value Date   LDH 301 (H) 09/09/2019    Lab Results  Component Value Date   IRON 134 03/23/2019   TIBC 326 03/23/2019   IRONPCTSAT 41 (H) 03/23/2019   (Iron and TIBC)  Lab Results  Component Value Date   FERRITIN 1,666 (H) 09/09/2019    Urinalysis    Component Value Date/Time   COLORURINE YELLOW 03/22/2019 [place] 03/22/2019 1609   LABSPEC 1.010 03/22/2019 1609   PHURINE 6.0 03/22/2019 1609   GLUCOSEU NEGATIVE 03/22/2019 [place] 03/22/2019 [place] 03/22/2019 1609   KETONESUR NEGATIVE 03/22/2019 1609   PROTEINUR NEGATIVE 03/22/2019 1609   UROBILINOGEN 0.2 08/17/2012 1120   NITRITE NEGATIVE 03/22/2019 [place] 03/22/2019 1609     STUDIES:  No results found.   ELIGIBLE FOR AVAILABLE RESEARCH PROTOCOL: no   ASSESSMENT: 74 y.o.  man with a history of end-stage renal disease secondary to hypertension and diabetes, on hemodialysis as of 2010  (a) status post  renal transplant at [facility] 03/31/2015  (1) pancytopenia noted 03/22/2019 with fatigue and shortness of breath  (a) smear review confirmed pancytopenia and macrocytosis with hypogranular polys   (b) Bone Marrow Biopsy on 05/02/2019 shows hypercellular marrow with trilineal  dysplasia, slightly left shifted myeloid maturation but no increase in blast, mild myelofibrosis  (c) Flow Cytometry shows no increase in CD34 positive blasts and left shifted myeloid maturation   (d) 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   (2) myelodysplasia, as per #1 above  (a) prognosis: Moderate risk  (3) retacrit started 05/22/2019 at 20K units, repeated weekly  (a) baseline epo level >500, which predicts a poor response to EPO  (b) ferritin 1060 on 03/23/2019  (c) retacrit dose increased to 40K weekly starting 06/19/2019  (4) azacytidine started 07/08/2019, initially repeated daily x5  (a) cycle 2 started 08/05/2019, received 7 days treatment, not well tolerated  (b) cycles decreased to 6 days every 28 days starting with cycle 3 (09/02/2019)  (c) Blasts noted on peripheral blood on 10/22/2019; patient referred to hospice   PLAN: 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  Dr. Jana Hakim and I looked at his blood film today and he has blasts noted.  We are sending it to our pathologists.    Dr. Jana Hakim met with [name] and his wife [name].  He was recommended hospice and a DNR form was signed.  Please see Dr. Virgie Dad addendum for his conversation with [name] and his wife.    [name] will receive one unit of blood today in infusion.  Val Nicola Girt RN is working on hospice placement.     David Bihari, NP 10/22/19 11:42 AM Medical Oncology and Hematology [facility] [place], [place]  [address] Tel. [phone]    Fax. [phone]   ADDENDUM: I reviewed the patient's blood film and he now has multiple circulating blasts.  We have requested a pathology review to confirm but I do not have any doubt that his myelodysplasia has transformed.  Mr. Thornton currently is lethargic, speaks slightly slurred but mostly coherently, and shows evidence of confusion.  We decided to transfuse 1 unit of blood to see if that was helpful to him and his family as they face his last days  I explained separately to his wife that I do not think he will live 2weeks.  She understands and how has an acute leukemia.  We have no curative treatment for him and any treatment that I could attempt is likely to make him worse rather than better.    My recommendation was that we switch to comfort care.  The patient's wife who is his healthcare power of attorney understood and was in agreement.  I wrote an out of facility DO NOT RESUSCITATE order.  We called [facility] to see if they have a bed which they do not at this point.  We placed a referral to hospice and hopefully they will see him within the next 24 hours at his home.  The patient's wife really does not feel that she is going to be able to care for him there.  The children are coming by and helping.  I would anticipate transfer to become placed within the next 24 to 48 hours.  I personally saw this patient and performed a substantive portion of this encounter with the listed APP documented above.   Chauncey Cruel, MD Medical Oncology and Hematology [facility] [address] [place], [place] [address] Tel. [phone]    Fax. [phone]

## 2019-10-23 LAB — PATHOLOGIST SMEAR REVIEW

## 2019-10-24 ENCOUNTER — Other Ambulatory Visit: Payer: Self-pay | Admitting: Oncology

## 2019-10-24 NOTE — Progress Notes (Signed)
I contacted [name].  He was in bed watching westerns.  He is comfortable.  He is "waiting for his savior".  He is comfortable and does not appear anxious.  His wife feels he can handle him at home with the help of hospice but if things become more complicated they will transfer him to [facility].  I also was called by Dr. Reuel Derby and pathology confirming that we are dealing with transformation.  The family will call us if they have any further needs.

## 2019-10-26 LAB — BPAM RBC
Blood Product Expiration Date: 202101252359
Blood Product Expiration Date: 202101252359
ISSUE DATE / TIME: 202101051325
Unit Type and Rh: 6200
Unit Type and Rh: 6200

## 2019-10-26 LAB — TYPE AND SCREEN
ABO/RH(D): A POS
Antibody Screen: NEGATIVE
Unit division: 0
Unit division: 0

## 2019-10-28 ENCOUNTER — Other Ambulatory Visit: Payer: Medicare Other

## 2019-10-28 ENCOUNTER — Ambulatory Visit: Payer: Medicare Other | Admitting: Oncology

## 2019-10-28 ENCOUNTER — Ambulatory Visit: Payer: Medicare Other

## 2019-10-28 ENCOUNTER — Encounter: Payer: Medicare Other | Admitting: Nutrition

## 2019-10-29 ENCOUNTER — Ambulatory Visit: Payer: Medicare Other

## 2019-10-29 ENCOUNTER — Other Ambulatory Visit: Payer: Self-pay | Admitting: Oncology

## 2019-10-30 ENCOUNTER — Ambulatory Visit: Payer: Medicare Other

## 2019-10-30 ENCOUNTER — Other Ambulatory Visit: Payer: Self-pay | Admitting: Oncology

## 2019-10-30 NOTE — Progress Notes (Signed)
Final note: Called by Dr Manny today to let me know the blood film review did not confirm acute leukemia. The blast count on the smear was 1-3%. She was not aware that Mr Van died within 72 hrs of that smear being obtained (despite receiving a transfusion).  Prior path review 03/22/2019 had shown only mild left shift.  To more fully document emergent transformation or frank leukemia would have required a bone marrow biopsy. Given the clinical situation that would not have been appropriate.  Accordingly I am putting myelodysplasia as the cause of death on the death certificate.      

## 2019-10-31 ENCOUNTER — Ambulatory Visit: Payer: Medicare Other

## 2019-11-01 ENCOUNTER — Ambulatory Visit: Payer: Medicare Other

## 2019-11-01 ENCOUNTER — Other Ambulatory Visit: Payer: Self-pay | Admitting: Oncology

## 2019-11-04 ENCOUNTER — Other Ambulatory Visit: Payer: Medicare Other

## 2019-11-04 ENCOUNTER — Ambulatory Visit: Payer: Medicare Other

## 2019-11-06 NOTE — Telephone Encounter (Signed)
No entry 

## 2019-11-11 ENCOUNTER — Ambulatory Visit: Payer: Medicare Other

## 2019-11-11 ENCOUNTER — Other Ambulatory Visit: Payer: Medicare Other

## 2019-11-18 ENCOUNTER — Other Ambulatory Visit: Payer: Medicare Other

## 2019-11-18 ENCOUNTER — Ambulatory Visit: Payer: Medicare Other

## 2019-11-18 DEATH — deceased

## 2019-11-19 ENCOUNTER — Other Ambulatory Visit: Payer: Medicare Other

## 2019-11-20 ENCOUNTER — Other Ambulatory Visit: Payer: Medicare Other

## 2019-11-25 ENCOUNTER — Ambulatory Visit: Payer: Medicare Other

## 2019-11-25 ENCOUNTER — Ambulatory Visit: Payer: Medicare Other | Admitting: Oncology

## 2019-11-26 ENCOUNTER — Ambulatory Visit: Payer: Medicare Other

## 2019-11-27 ENCOUNTER — Ambulatory Visit: Payer: Medicare Other

## 2019-11-28 ENCOUNTER — Ambulatory Visit: Payer: Medicare Other

## 2019-11-29 ENCOUNTER — Ambulatory Visit: Payer: Medicare Other

## 2019-12-02 ENCOUNTER — Other Ambulatory Visit: Payer: Medicare Other

## 2019-12-02 ENCOUNTER — Ambulatory Visit: Payer: Medicare Other

## 2019-12-09 ENCOUNTER — Other Ambulatory Visit: Payer: Medicare Other

## 2019-12-09 ENCOUNTER — Ambulatory Visit: Payer: Medicare Other

## 2019-12-23 ENCOUNTER — Ambulatory Visit: Payer: Self-pay | Admitting: Podiatry
